# Patient Record
Sex: Female | Born: 1945
Health system: Southern US, Community
[De-identification: ages and names within clinical notes are randomized; demographics above are authoritative.]

## PROBLEM LIST (undated history)

## (undated) DIAGNOSIS — E119 Type 2 diabetes mellitus without complications: Secondary | ICD-10-CM

## (undated) DIAGNOSIS — M5030 Other cervical disc degeneration, unspecified cervical region: Secondary | ICD-10-CM

## (undated) DIAGNOSIS — Z860100 Personal history of colon polyps, unspecified: Secondary | ICD-10-CM

## (undated) DIAGNOSIS — M67431 Ganglion, right wrist: Secondary | ICD-10-CM

## (undated) DIAGNOSIS — N390 Urinary tract infection, site not specified: Secondary | ICD-10-CM

## (undated) DIAGNOSIS — J449 Chronic obstructive pulmonary disease, unspecified: Secondary | ICD-10-CM

## (undated) DIAGNOSIS — IMO0001 Reserved for inherently not codable concepts without codable children: Secondary | ICD-10-CM

## (undated) DIAGNOSIS — M51369 Other intervertebral disc degeneration, lumbar region without mention of lumbar back pain or lower extremity pain: Secondary | ICD-10-CM

## (undated) DIAGNOSIS — Z9889 Other specified postprocedural states: Secondary | ICD-10-CM

## (undated) DIAGNOSIS — D751 Secondary polycythemia: Secondary | ICD-10-CM

## (undated) DIAGNOSIS — C539 Malignant neoplasm of cervix uteri, unspecified: Secondary | ICD-10-CM

## (undated) DIAGNOSIS — D4959 Neoplasm of unspecified behavior of other genitourinary organ: Secondary | ICD-10-CM

## (undated) DIAGNOSIS — K5730 Diverticulosis of large intestine without perforation or abscess without bleeding: Secondary | ICD-10-CM

## (undated) DIAGNOSIS — K635 Polyp of colon: Secondary | ICD-10-CM

## (undated) DIAGNOSIS — B3731 Acute candidiasis of vulva and vagina: Secondary | ICD-10-CM

## (undated) DIAGNOSIS — H3530 Unspecified macular degeneration: Secondary | ICD-10-CM

## (undated) DIAGNOSIS — D249 Benign neoplasm of unspecified breast: Secondary | ICD-10-CM

## (undated) DIAGNOSIS — F17200 Nicotine dependence, unspecified, uncomplicated: Secondary | ICD-10-CM

## (undated) DIAGNOSIS — D499 Neoplasm of unspecified behavior of unspecified site: Secondary | ICD-10-CM

## (undated) DIAGNOSIS — K648 Other hemorrhoids: Secondary | ICD-10-CM

## (undated) DIAGNOSIS — E785 Hyperlipidemia, unspecified: Secondary | ICD-10-CM

## (undated) DIAGNOSIS — R350 Frequency of micturition: Secondary | ICD-10-CM

## (undated) DIAGNOSIS — H269 Unspecified cataract: Secondary | ICD-10-CM

## (undated) DIAGNOSIS — I350 Nonrheumatic aortic (valve) stenosis: Secondary | ICD-10-CM

## (undated) DIAGNOSIS — J45909 Unspecified asthma, uncomplicated: Secondary | ICD-10-CM

## (undated) DIAGNOSIS — M5136 Other intervertebral disc degeneration, lumbar region: Secondary | ICD-10-CM

## (undated) DIAGNOSIS — R112 Nausea with vomiting, unspecified: Secondary | ICD-10-CM

## (undated) DIAGNOSIS — S5290XA Unspecified fracture of unspecified forearm, initial encounter for closed fracture: Secondary | ICD-10-CM

## (undated) DIAGNOSIS — B373 Candidiasis of vulva and vagina: Secondary | ICD-10-CM

## (undated) HISTORY — PX: TONSILLECTOMY: SUR1361

## (undated) HISTORY — PX: APPENDECTOMY: SHX54

## (undated) HISTORY — PX: TUBAL LIGATION: SHX77

## (undated) HISTORY — PX: ABDOMINAL HYSTERECTOMY: SHX81

## (undated) HISTORY — DX: Nicotine dependence, unspecified, uncomplicated: F17.200

## (undated) HISTORY — PX: CATARACT EXTRACTION, BILATERAL: SHX1313

## (undated) HISTORY — PX: BLADDER SURGERY: SHX569

## (undated) HISTORY — PX: OTHER SURGICAL HISTORY: SHX169

## (undated) HISTORY — PX: BREAST LUMPECTOMY: SHX2

## (undated) HISTORY — DX: Type 2 diabetes mellitus without complications: E11.9

## (undated) HISTORY — DX: Hyperlipidemia, unspecified: E78.5

## (undated) HISTORY — DX: Reserved for inherently not codable concepts without codable children: IMO0001

## (undated) HISTORY — PX: TUMOR EXCISION: SHX421

## (undated) HISTORY — PX: BLADDER NECK SUSPENSION: SHX1240

---

## 1999-12-05 ENCOUNTER — Encounter (INDEPENDENT_AMBULATORY_CARE_PROVIDER_SITE_OTHER): Payer: Self-pay | Admitting: Specialist

## 1999-12-05 ENCOUNTER — Other Ambulatory Visit: Admission: RE | Admit: 1999-12-05 | Discharge: 1999-12-05 | Payer: Self-pay | Admitting: Internal Medicine

## 2000-08-27 ENCOUNTER — Other Ambulatory Visit: Admission: RE | Admit: 2000-08-27 | Discharge: 2000-08-27 | Payer: Self-pay | Admitting: Family Medicine

## 2000-09-15 ENCOUNTER — Encounter (INDEPENDENT_AMBULATORY_CARE_PROVIDER_SITE_OTHER): Payer: Self-pay | Admitting: Internal Medicine

## 2001-06-08 ENCOUNTER — Emergency Department (HOSPITAL_COMMUNITY): Admission: EM | Admit: 2001-06-08 | Discharge: 2001-06-08 | Payer: Self-pay | Admitting: Emergency Medicine

## 2001-06-08 ENCOUNTER — Encounter: Payer: Self-pay | Admitting: *Deleted

## 2001-12-10 DIAGNOSIS — K5730 Diverticulosis of large intestine without perforation or abscess without bleeding: Secondary | ICD-10-CM | POA: Insufficient documentation

## 2002-11-18 DIAGNOSIS — R079 Chest pain, unspecified: Secondary | ICD-10-CM | POA: Insufficient documentation

## 2002-12-12 ENCOUNTER — Encounter: Payer: Self-pay | Admitting: Family Medicine

## 2002-12-12 ENCOUNTER — Encounter: Admission: RE | Admit: 2002-12-12 | Discharge: 2002-12-12 | Payer: Self-pay | Admitting: Family Medicine

## 2003-05-05 DIAGNOSIS — M25519 Pain in unspecified shoulder: Secondary | ICD-10-CM | POA: Insufficient documentation

## 2004-06-12 ENCOUNTER — Other Ambulatory Visit: Admission: RE | Admit: 2004-06-12 | Discharge: 2004-06-12 | Payer: Self-pay | Admitting: Obstetrics and Gynecology

## 2005-01-23 ENCOUNTER — Emergency Department (HOSPITAL_COMMUNITY): Admission: EM | Admit: 2005-01-23 | Discharge: 2005-01-23 | Payer: Self-pay | Admitting: Family Medicine

## 2005-04-28 ENCOUNTER — Emergency Department (HOSPITAL_COMMUNITY): Admission: EM | Admit: 2005-04-28 | Discharge: 2005-04-28 | Payer: Self-pay | Admitting: Family Medicine

## 2005-07-30 ENCOUNTER — Emergency Department (HOSPITAL_COMMUNITY): Admission: EM | Admit: 2005-07-30 | Discharge: 2005-07-30 | Payer: Self-pay | Admitting: *Deleted

## 2005-08-19 ENCOUNTER — Emergency Department (HOSPITAL_COMMUNITY): Admission: EM | Admit: 2005-08-19 | Discharge: 2005-08-20 | Payer: Self-pay | Admitting: Emergency Medicine

## 2006-03-29 ENCOUNTER — Emergency Department (HOSPITAL_COMMUNITY): Admission: EM | Admit: 2006-03-29 | Discharge: 2006-03-29 | Payer: Self-pay | Admitting: Pediatrics

## 2006-06-06 ENCOUNTER — Emergency Department (HOSPITAL_COMMUNITY): Admission: EM | Admit: 2006-06-06 | Discharge: 2006-06-06 | Payer: Self-pay | Admitting: Emergency Medicine

## 2007-02-17 ENCOUNTER — Emergency Department (HOSPITAL_COMMUNITY): Admission: EM | Admit: 2007-02-17 | Discharge: 2007-02-17 | Payer: Self-pay | Admitting: Emergency Medicine

## 2007-10-20 ENCOUNTER — Emergency Department (HOSPITAL_COMMUNITY): Admission: EM | Admit: 2007-10-20 | Discharge: 2007-10-20 | Payer: Self-pay | Admitting: Family Medicine

## 2007-12-20 ENCOUNTER — Emergency Department (HOSPITAL_COMMUNITY): Admission: EM | Admit: 2007-12-20 | Discharge: 2007-12-20 | Payer: Self-pay | Admitting: Family Medicine

## 2008-01-28 ENCOUNTER — Ambulatory Visit: Payer: Self-pay | Admitting: Internal Medicine

## 2008-01-28 DIAGNOSIS — J449 Chronic obstructive pulmonary disease, unspecified: Secondary | ICD-10-CM | POA: Insufficient documentation

## 2008-01-28 DIAGNOSIS — J45909 Unspecified asthma, uncomplicated: Secondary | ICD-10-CM | POA: Insufficient documentation

## 2008-01-28 DIAGNOSIS — F341 Dysthymic disorder: Secondary | ICD-10-CM | POA: Insufficient documentation

## 2008-01-28 DIAGNOSIS — B354 Tinea corporis: Secondary | ICD-10-CM | POA: Insufficient documentation

## 2008-01-28 DIAGNOSIS — R350 Frequency of micturition: Secondary | ICD-10-CM | POA: Insufficient documentation

## 2008-01-28 DIAGNOSIS — H3530 Unspecified macular degeneration: Secondary | ICD-10-CM | POA: Insufficient documentation

## 2008-01-28 LAB — CONVERTED CEMR LAB
Bilirubin Urine: NEGATIVE
Blood Glucose, Fingerstick: 160
Hgb A1c MFr Bld: 7.1 %
Ketones, urine, test strip: NEGATIVE
Nitrite: NEGATIVE
Specific Gravity, Urine: <1.005
Urobilinogen, UA: 0.2
pH: 6

## 2008-01-29 ENCOUNTER — Encounter (INDEPENDENT_AMBULATORY_CARE_PROVIDER_SITE_OTHER): Payer: Self-pay | Admitting: Internal Medicine

## 2008-02-08 ENCOUNTER — Encounter (INDEPENDENT_AMBULATORY_CARE_PROVIDER_SITE_OTHER): Payer: Self-pay | Admitting: Internal Medicine

## 2008-02-15 ENCOUNTER — Ambulatory Visit: Payer: Self-pay | Admitting: Internal Medicine

## 2008-02-22 ENCOUNTER — Encounter (INDEPENDENT_AMBULATORY_CARE_PROVIDER_SITE_OTHER): Payer: Self-pay | Admitting: Internal Medicine

## 2008-04-14 ENCOUNTER — Emergency Department (HOSPITAL_COMMUNITY): Admission: EM | Admit: 2008-04-14 | Discharge: 2008-04-14 | Payer: Self-pay | Admitting: Family Medicine

## 2008-08-11 ENCOUNTER — Ambulatory Visit: Payer: Self-pay | Admitting: Internal Medicine

## 2008-08-11 DIAGNOSIS — Z860100 Personal history of colon polyps, unspecified: Secondary | ICD-10-CM | POA: Insufficient documentation

## 2008-08-11 DIAGNOSIS — F17200 Nicotine dependence, unspecified, uncomplicated: Secondary | ICD-10-CM | POA: Insufficient documentation

## 2008-08-11 DIAGNOSIS — M76899 Other specified enthesopathies of unspecified lower limb, excluding foot: Secondary | ICD-10-CM | POA: Insufficient documentation

## 2008-08-11 LAB — CONVERTED CEMR LAB
Bilirubin Urine: NEGATIVE
Glucose, Urine, Semiquant: 100
Ketones, urine, test strip: NEGATIVE

## 2008-08-15 ENCOUNTER — Ambulatory Visit (HOSPITAL_COMMUNITY): Admission: RE | Admit: 2008-08-15 | Discharge: 2008-08-15 | Payer: Self-pay | Admitting: Internal Medicine

## 2008-08-23 DIAGNOSIS — E782 Mixed hyperlipidemia: Secondary | ICD-10-CM | POA: Insufficient documentation

## 2008-08-23 DIAGNOSIS — D751 Secondary polycythemia: Secondary | ICD-10-CM | POA: Insufficient documentation

## 2008-08-23 LAB — CONVERTED CEMR LAB
ALT: 18 units/L (ref 0–35)
Alkaline Phosphatase: 54 units/L (ref 39–117)
CO2: 23 meq/L (ref 19–32)
Calcium: 9.6 mg/dL (ref 8.4–10.5)
Chloride: 102 meq/L (ref 96–112)
Cholesterol: 234 mg/dL — ABNORMAL HIGH (ref 0–200)
Creatinine, Ser: 0.7 mg/dL (ref 0.40–1.20)
Eosinophils Absolute: 0.3 10*3/uL (ref 0.0–0.7)
Glucose, Bld: 124 mg/dL — ABNORMAL HIGH (ref 70–99)
LDL Cholesterol: 145 mg/dL — ABNORMAL HIGH (ref 0–99)
Platelets: 207 10*3/uL (ref 150–400)
RDW: 13.6 % (ref 11.5–15.5)
Sodium: 141 meq/L (ref 135–145)
Triglycerides: 192 mg/dL — ABNORMAL HIGH (ref <150)
VLDL: 38 mg/dL (ref 0–40)
WBC: 10.3 10*3/uL (ref 4.0–10.5)

## 2008-09-08 ENCOUNTER — Ambulatory Visit: Payer: Self-pay | Admitting: Internal Medicine

## 2008-09-08 DIAGNOSIS — E119 Type 2 diabetes mellitus without complications: Secondary | ICD-10-CM | POA: Insufficient documentation

## 2008-10-03 ENCOUNTER — Encounter (INDEPENDENT_AMBULATORY_CARE_PROVIDER_SITE_OTHER): Payer: Self-pay | Admitting: Internal Medicine

## 2008-10-03 ENCOUNTER — Encounter: Admission: RE | Admit: 2008-10-03 | Discharge: 2008-10-03 | Payer: Self-pay | Admitting: Internal Medicine

## 2008-10-20 ENCOUNTER — Ambulatory Visit: Payer: Self-pay | Admitting: Internal Medicine

## 2008-10-20 DIAGNOSIS — M6740 Ganglion, unspecified site: Secondary | ICD-10-CM | POA: Insufficient documentation

## 2008-10-20 LAB — CONVERTED CEMR LAB: Blood Glucose, Fingerstick: 141

## 2008-10-24 ENCOUNTER — Encounter (INDEPENDENT_AMBULATORY_CARE_PROVIDER_SITE_OTHER): Payer: Self-pay | Admitting: Internal Medicine

## 2008-11-28 ENCOUNTER — Encounter (INDEPENDENT_AMBULATORY_CARE_PROVIDER_SITE_OTHER): Payer: Self-pay | Admitting: Internal Medicine

## 2009-02-20 ENCOUNTER — Ambulatory Visit: Payer: Self-pay | Admitting: Internal Medicine

## 2009-02-20 DIAGNOSIS — B373 Candidiasis of vulva and vagina: Secondary | ICD-10-CM | POA: Insufficient documentation

## 2009-02-20 DIAGNOSIS — J069 Acute upper respiratory infection, unspecified: Secondary | ICD-10-CM | POA: Insufficient documentation

## 2009-02-20 LAB — CONVERTED CEMR LAB
Blood Glucose, Fingerstick: 190
Hgb A1c MFr Bld: 7 % — ABNORMAL HIGH (ref 4.6–6.1)

## 2009-03-13 ENCOUNTER — Encounter (INDEPENDENT_AMBULATORY_CARE_PROVIDER_SITE_OTHER): Payer: Self-pay | Admitting: Internal Medicine

## 2009-06-25 ENCOUNTER — Emergency Department (HOSPITAL_COMMUNITY): Admission: EM | Admit: 2009-06-25 | Discharge: 2009-06-25 | Payer: Self-pay | Admitting: Family Medicine

## 2009-08-25 ENCOUNTER — Encounter (INDEPENDENT_AMBULATORY_CARE_PROVIDER_SITE_OTHER): Payer: Self-pay | Admitting: Internal Medicine

## 2009-08-25 DIAGNOSIS — D249 Benign neoplasm of unspecified breast: Secondary | ICD-10-CM | POA: Insufficient documentation

## 2009-08-25 DIAGNOSIS — M19049 Primary osteoarthritis, unspecified hand: Secondary | ICD-10-CM | POA: Insufficient documentation

## 2009-09-02 DIAGNOSIS — I359 Nonrheumatic aortic valve disorder, unspecified: Secondary | ICD-10-CM | POA: Insufficient documentation

## 2009-09-02 DIAGNOSIS — I350 Nonrheumatic aortic (valve) stenosis: Secondary | ICD-10-CM

## 2009-09-02 HISTORY — DX: Nonrheumatic aortic (valve) stenosis: I35.0

## 2010-03-21 NOTE — Letter (Signed)
Summary: RECEIVED RECORDS FROM [facility]  RECEIVED RECORDS FROM [facility]   Imported By: Arta Bruce 09/11/2009 15:58:12  _____________________________________________________________________  External Attachment:    Type:   Image     Comment:   External Document

## 2010-03-21 NOTE — Letter (Signed)
Summary: *HSN Results Follow up  [facility]  [address] [place], [place] [address]   Phone: [phone]  Fax: [phone]      03/13/2009   [facility] [place] [address] [place], [place]  [address]   Dear  Ms. Zali Frieden,                            ____S.Drinkard,FNP   ____D. Gore,FNP       ____B. McPherson,MD   ____V. Rankins,MD    __X__E. Dannel Rafter,MD    ____N. Daphine Deutscher, FNP  ____D. Reche Dixon, MD    ____K. Philipp Deputy, MD    ____Other     This letter is to inform you that your recent test(s):  _______Pap Smear    ___X____Lab Test     _______X-ray    ___X____ is within acceptable limits  _______ requires a medication change  _______ requires a follow-up lab visit  _______ requires a follow-up visit with your provider   Comments:  Despite drinking a large quantity of orange juice and struggling with desserts over the holidays, your sugar control was not terrible.  I would still encourage you to eat fruit rather than the juice and cut back to just one cup of juice daily as discussedl.       _________________________________________________________ If you have any questions, please contact our office                     Sincerely,  Julieanne Manson MD [facility]

## 2010-03-21 NOTE — Miscellaneous (Signed)
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 Swaziland, cardiologist. to AORTIC STENOSIS, MILD (ICD-424.1) - Dr. Swaziland, cardiologist. 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) VAGINITIS, CANDIDAL (ICD-112.1) URI (ICD-465.9) GANGLION CYST, WRIST, RIGHT (ICD-727.41) DIABETES MELLITUS, TYPE II (ICD-250.00) HYPERLIPIDEMIA, MIXED (ICD-272.2) POLYCYTHEMIA, SECONDARY (ICD-289.0) TOBACCO ABUSE (ICD-305.1) TROCHANTERIC BURSITIS, RIGHT (ICD-726.5) COLONIC POLYPS, ADENOMATOUS, HX OF (ICD-V12.72) HEALTH MAINTENANCE EXAM (ICD-V70.0) DEPRESSION/ANXIETY (ICD-300.4) FREQUENCY, URINARY (ICD-788.41) TINEA CORPORIS (ICD-110.5) MACULAR DEGENERATION (ICD-362.50) AORTIC STENOSIS, MILD (ICD-424.1) ASTHMA, CHILDHOOD (ICD-493.00) COPD (ICD-496)   (08/25/2009 8:32) Added new observation of COLONOSCOPY: Dr. 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) VAGINITIS, CANDIDAL (ICD-112.1) URI (ICD-465.9) GANGLION CYST, WRIST, RIGHT (ICD-727.41) DIABETES MELLITUS, TYPE II (ICD-250.00) HYPERLIPIDEMIA, MIXED (ICD-272.2) POLYCYTHEMIA, SECONDARY (ICD-289.0) TOBACCO ABUSE (ICD-305.1) TROCHANTERIC BURSITIS, RIGHT (ICD-726.5) COLONIC POLYPS, ADENOMATOUS, HX OF (ICD-V12.72) HEALTH MAINTENANCE EXAM (ICD-V70.0) DEPRESSION/ANXIETY  (ICD-300.4) FREQUENCY, URINARY (ICD-788.41) TINEA CORPORIS (ICD-110.5) MACULAR DEGENERATION (ICD-362.50) AORTIC STENOSIS, MILD (ICD-424.1) ASTHMA, CHILDHOOD (ICD-493.00) COPD (ICD-496)    Appended Document: old records update    Clinical Lists Changes  Problems: Added new problem of DIVERTICULOSIS OF COLON (ICD-562.10) - colonoscopy      Appended Document: old records update    Clinical Lists Changes  Problems: Changed problem from AORTIC STENOSIS, MILD (ICD-424.1) - Dr. Swaziland, cardiologist. to AORTIC STENOSIS, MILD (ICD-424.1) - Dr. Swaziland, cardiologist. Added new problem of CHEST PAIN (ICD-786.50) - Dr. Jordan:Stress Cardiolite showed Mild attenuation in distal anterior wall.  Slightly worse on stress images/  Present on resting images.  Felt to most likely represent breast attenuation. Frequent PVC and a pattern of bigeminy on resting ECG, otherwise normal ECG at rest and with stress.  No chest pain on stress.  To work on risk factors.

## 2010-03-21 NOTE — Letter (Signed)
Summary: RECEIVED RECORDS [facility]  [facility]   Imported By: Arta Bruce 09/03/2009 16:17:10  _____________________________________________________________________  External Attachment:    Type:   Image     Comment:   External Document

## 2010-03-21 NOTE — Assessment & Plan Note (Signed)
Summary: 1 MONTH -6 WEEKK FU//KT   Vital Signs:  Patient profile:   65 year old female Menstrual status:  hysterectomy Weight:      185 pounds Temp:     98.2 degrees F Pulse rate:   77 / minute Pulse rhythm:   regular Resp:     18 per minute BP sitting:   125 / 79  (left arm) Cuff size:   regular  Vitals Entered By: Vesta Mixer CMA (February 20, 2009 9:03 AM) Is Patient Diabetic? Yes Pain Assessment Patient in pain? no      CBG Result 190  Does patient need assistance? Ambulation Normal   History of Present Illness: 1.  Anxiety and stress:  Feels she is doing well.  Did not take the Wellbutin beyond 1 month--cancelled a couple of follow up appts.  States things have improved in some ways in life and that has helped.    2.  Tobacco Cessation:  States has smoked a couple of times--last was 2 1/2 weeks ago--took 2 puffs and put it out.  All of friends smoke.  Granddaughter also no longer living with her--no longer exposing her to tobacco smoke.  Pt. still with legal custody.  3.  DM:  Eating a hard candy secondary to hoarseness.  Ate a lot of desserts over the holiday.  Generally trying to watch what she its, however.  Walking regularly when weather permits.  Does not have a glucometer.  Pt. would really like to avoid meds.  Did go to Nutrition--felt she received good info.  Later states she has been drinking a quart daily of orange juice.  4.  Hyperlipidemia:  Pt. does not give a good low cholesterol diet.  5.  Sore throat, cough and congestion with hoarse voice started 6 days ago.  No definite posterior pharyngeal drainage--was coughing up tan mucous previously--that is gone.  Using Nyquil.  No fevers.  No dyspnea. Clear nasal drainage.  7.  Abscessed tooth--right maxillary:  went to [facility]--on Amoxicillin.  Having vaginal itch and discharge   Allergies (verified): 1)  ! Codeine  Physical Exam  General:  NAD, hoarse sounding Eyes:  Clear watering from right  eye Ears:  External ear exam shows no significant lesions or deformities.  Otoscopic examination reveals clear canals, tympanic membranes are intact bilaterally without bulging, retraction, inflammation or discharge. Hearing is grossly normal bilaterally. Nose:  clear discharge, mildly swollen nasal mucosa. Mouth:  pharynx pink and moist.   Neck:  No deformities, masses, or tenderness noted. Lungs:  Normal respiratory effort, chest expands symmetrically. Lungs are clear to auscultation, no crackles or wheezes. Heart:  Normal rate and regular rhythm. S1 and S2 normal without gallop, murmur, click, rub or other extra sounds.  Radial pulses normal and equal.   Impression & Recommendations:  Problem # 1:  DIABETES MELLITUS, TYPE II (ICD-250.00) Wants another 3-4 months to work on diet and lifestyle changes before trying meds. Orders: Capillary Blood Glucose/CBG (82948) T- Hemoglobin A1C ([address]-60454) Flu shot today.  Problem # 2:  HYPERLIPIDEMIA, MIXED (ICD-272.2) As above  Problem # 3:  TOBACCO ABUSE (ICD-305.1) Doing fairly well with cessation  Problem # 4:  DEPRESSION/ANXIETY (ICD-300.4) Pt. feels this is no longer an issue  Problem # 5:  URI (ICD-465.9) Supportive treatment  Problem # 6:  VAGINITIS, CANDIDAL (ICD-112.1)  On Amoxicillin long term--having yeast symptoms.  Her updated medication list for this problem includes:    Fluconazole 150 Mg Tabs (Fluconazole) .Marland Kitchen... 1 tab  by mouth today.  may repeat if symptoms return for 1 dose --while on amoxicillin  Complete Medication List: 1)  Advair Diskus 100-50 Mcg/dose Misc (Fluticasone-salmeterol) .Marland Kitchen.. 1 inhalation two times a day 2)  Fluconazole 150 Mg Tabs (Fluconazole) .Marland Kitchen.. 1 tab by mouth today.  may repeat if symptoms return for 1 dose --while on amoxicillin  Patient Instructions: 1)  Fasting labs in 3 months--FLP, A1C, urine microalbumin, BMET 2)  Follow up with Dr. Delrae Alfred in 3 months --schedule a couple of days after  labs done. 3)  Stop drinking juice or sugary drinks. 4)  Take Fluconazole for yeast now, repeat intermittently if needed while taking Amoxicillin. Prescriptions: FLUCONAZOLE 150 MG TABS (FLUCONAZOLE) 1 tab by mouth today.  May repeat if symptoms return for 1 dose --while on Amoxicillin  #2 x 0   Entered and Authorized by:   Julieanne Manson MD   Signed by:   Julieanne Manson MD on 02/20/2009   Method used:   Electronically to        [facility] [phone]* (retail)       [address]       [place], [place]  [address]       Ph: [phone]       Fax: [phone]   RxID:   [id number]   Laboratory Results   Blood Tests     CBG Random:: 190mg /dL       Appended Document: 1 MONTH -6 WEEKK FU//KT   Influenza Vaccine    Vaccine Type: Fluvax 3+    Site: right deltoid    Mfr: Sanofi Pasteur    Dose: 0.5 ml    Route: IM    Given by: Vesta Mixer CMA    Exp. Date: 08/16/2009    Lot #: [id number]    VIS given: 09/10/06 version given February 20, 2009.  Flu Vaccine Consent Questions    Do you have a history of severe allergic reactions to this vaccine? no    Any prior history of allergic reactions to egg and/or gelatin? no    Do you have a sensitivity to the preservative Thimersol? no    Do you have a past history of Guillan-Barre Syndrome? no    Do you currently have an acute febrile illness? no    Have you ever had a severe reaction to latex? no    Vaccine information given and explained to patient? yes    Are you currently pregnant? no

## 2010-03-28 ENCOUNTER — Inpatient Hospital Stay (INDEPENDENT_AMBULATORY_CARE_PROVIDER_SITE_OTHER)
Admission: RE | Admit: 2010-03-28 | Discharge: 2010-03-28 | Disposition: A | Discharge status: Home / Self Care | Payer: Self-pay | Source: Ambulatory Visit | Attending: Family Medicine | Admitting: Family Medicine

## 2010-03-28 DIAGNOSIS — J45909 Unspecified asthma, uncomplicated: Secondary | ICD-10-CM

## 2010-07-05 NOTE — H&P (Signed)
NAMECAMILIA, Kathleen Ramirez                ACCOUNT NO.:  [id number]   MEDICAL RECORD NO.:  [id number]          PATIENT TYPE:  EMS   LOCATION:  [facility]                         FACILITY:  [facility]   PHYSICIAN:  Gordy Savers, MDDATE OF BIRTH:  [birth year]   DATE OF ADMISSION:  08/19/2005  DATE OF DISCHARGE:  08/20/2005                                HISTORY & PHYSICAL   CHIEF COMPLAINT:  Shortness of breath.   HISTORY OF PRESENT ILLNESS:  The patient is a 65 year old white female with  a history of advanced lung disease.  She was stable until shortly before  admission, when she awoke complaining of increasing shortness of breath.  She presented to the ED for evaluation, where she is noted to have a  moderate severe anemia with an H&H of 6.8 and 20% respectively.  She is now  admitted for further evaluation and treatment of her anemia.   The patient was last hospitalized in April 2007.  At that time, she  presented with acute on chronic respiratory failure, and the patient was  noted have a low hemoglobin of 6.7 at that time.  The patient received 2  units of packed RBCs and stabilized.  There was no documented GI bleeding at  that time.  She was evaluated by the GI service.  I do not feel she was  stable enough to undergo GI evaluation.  The patient states that has had  colonoscopy in the past but not a number of years.  The patient has been  hospitalized on the average of one or two times per year for decompensated  COPD, and there has been no prior episodes of anemia.   PAST MEDICAL HISTORY:  The patient has a history advanced COPD.  This also  has been described as chronic interstitial lung disease.  She is on home  oxygen therapy and has a DNR status.  She has been hospitalized in July 2003  for ventilator-dependent respiratory failure.  She has history of congestive  heart failure in the past and normal LV function.  Early this spring, did  have a follow-up 2-D echocardiogram that  revealed LVH and normal LV  function.  She has history of palpitations and chronic PVCs.  There is a  history of ethanol abuse and tobacco use.  She has hypertension and chronic  anxiety with insomnia and a chronic right bundle branch block.  There is  also history of carotid bruits.   OPERATIONS:  Have included ventral hernia repair, as well as a left  hemicolectomy.   ALLERGIES:  INCLUDE PENICILLIN, MORPHINE SULFATE.   PRESENT MEDICAL REGIMEN:  Includes:  1. Aspirin 81 mg daily.  2. Lisinopril 20 mg daily.  3. Protonix 40 mg daily.  4. Potassium chloride 40 mEq daily.  5. Mucinex DM.  6. Furosemide 40 mg daily.  7. Temazepam 30 mg at bedtime p.r.n. sleep.  8. Lorazepam 1 mg t.i.d. p.r.n.  9. DuoNeb nebulizer treatments.   SOCIAL HISTORY:  She has a son and daughter in the area.  According to a  prior record and the chart, they  provide around the clock care.  The patient  states that she lives alone, although is confused at present time.   EXAMINATION:  VITAL SIGNS:  Blood pressure 110/70, pulse 84, O2 saturation  90% on a Venturi mask.  HEAD AND NECK:  Revealed normal pupil responses.  Conjunctiva clear.  Oropharynx benign.  NECK:  Revealed no neck vein distension.  There is no audible bruits.  CHEST:  Revealed rhonchi, the right greater than the left.  CARDIOVASCULAR:  Exam revealed normal S1, S2 and no murmur.  ABDOMEN:  Obese, soft and nontender.  A large surgical scar was noted on the  right.  Bowel sounds were normal.  EXTREMITIES:  Revealed dry flaky skin but  no significant edema.  Peripheral pulses were nonpalpable.  NEURO:  Negative.   IMPRESSION:  Mildly severe anemia with heme-negative stool, advanced chronic  lung disease, hypertension, chronic anxiety.   DISPOSITION:  The patient will be admitted to hospital and transfused 2  units of packed RBCs.  Stool for occult blood will be monitored.  Anemia  workup will be instituted.  The patient will be maintained on  her chronic  pre-admission medications.           ______________________________  Gordy Savers, MD     PFK/MEDQ  D:  12/28/2005  T:  12/28/2005  Job:  [id number]

## 2010-08-23 ENCOUNTER — Ambulatory Visit (HOSPITAL_COMMUNITY)
Admission: RE | Admit: 2010-08-23 | Discharge: 2010-08-23 | Disposition: A | Discharge status: Home / Self Care | Payer: Self-pay | Source: Ambulatory Visit | Attending: Neurosurgery | Admitting: Neurosurgery

## 2010-08-23 ENCOUNTER — Other Ambulatory Visit (HOSPITAL_COMMUNITY): Payer: Self-pay | Admitting: Neurosurgery

## 2010-08-23 DIAGNOSIS — M545 Low back pain, unspecified: Secondary | ICD-10-CM | POA: Insufficient documentation

## 2010-08-23 DIAGNOSIS — M5137 Other intervertebral disc degeneration, lumbosacral region: Secondary | ICD-10-CM | POA: Insufficient documentation

## 2010-08-23 DIAGNOSIS — R52 Pain, unspecified: Secondary | ICD-10-CM

## 2010-08-23 DIAGNOSIS — IMO0001 Reserved for inherently not codable concepts without codable children: Secondary | ICD-10-CM | POA: Insufficient documentation

## 2010-08-23 DIAGNOSIS — M51379 Other intervertebral disc degeneration, lumbosacral region without mention of lumbar back pain or lower extremity pain: Secondary | ICD-10-CM | POA: Insufficient documentation

## 2010-11-22 LAB — POCT URINALYSIS DIP (DEVICE)
Bilirubin Urine: NEGATIVE
Ketones, ur: NEGATIVE
Operator id: 235561
Specific Gravity, Urine: 1.01

## 2011-06-23 ENCOUNTER — Emergency Department (HOSPITAL_COMMUNITY)
Admission: EM | Admit: 2011-06-23 | Discharge: 2011-06-23 | Disposition: A | Discharge status: Home / Self Care | Payer: Self-pay | Attending: Emergency Medicine | Admitting: Emergency Medicine

## 2011-06-23 ENCOUNTER — Encounter (HOSPITAL_COMMUNITY): Payer: Self-pay | Admitting: *Deleted

## 2011-06-23 ENCOUNTER — Emergency Department (HOSPITAL_COMMUNITY): Payer: Self-pay

## 2011-06-23 DIAGNOSIS — F17200 Nicotine dependence, unspecified, uncomplicated: Secondary | ICD-10-CM | POA: Insufficient documentation

## 2011-06-23 DIAGNOSIS — W208XXA Other cause of strike by thrown, projected or falling object, initial encounter: Secondary | ICD-10-CM | POA: Insufficient documentation

## 2011-06-23 DIAGNOSIS — S60219A Contusion of unspecified wrist, initial encounter: Secondary | ICD-10-CM | POA: Insufficient documentation

## 2011-06-23 DIAGNOSIS — M25539 Pain in unspecified wrist: Secondary | ICD-10-CM | POA: Insufficient documentation

## 2011-06-23 NOTE — ED Notes (Signed)
Pt states that a wratchet fell on her left wrist on Friday.  Pt continues to have pain to wrist, swelling or deformity noted?  No impaired sensation

## 2011-06-23 NOTE — Discharge Instructions (Signed)
Bone Bruise  A bone bruise is a small hidden fracture of the bone. It typically occurs with bones located close to the surface of the skin.  SYMPTOMS  The pain lasts longer than a normal bruise.   The bruised area is difficult to use.   There may be discoloration or swelling of the bruised area.   When a bone bruise is found with injury to the anterior cruciate ligament (in the knee) there is often an increased:   Amount of fluid in the knee   Time the fluid in the knee lasts.   Number of days until you are walking normally and regaining the motion you had before the injury.   Number of days with pain from the injury.  DIAGNOSIS  It can only be seen on X-rays known as MRIs. This stands for magnetic resonance imaging. A regular X-ray taken of a bone bruise would appear to be normal. A bone bruise is a common injury in the knee and the heel bone (calcaneus). The problems are similar to those produced by stress fractures, which are bone injuries caused by overuse. A bone bruise may also be a sign of other injuries. For example, bone bruises are commonly found where an anterior cruciate ligament (ACL) in the knee has been pulled away from the bone (ruptured). A ligament is a tough fibrous material that connects bones together to make our joints stable. Bruises of the bone last a lot longer than bruises of the muscle or tissues beneath the skin. Bone bruises can last from days to months and are often more severe and painful than other bruises. TREATMENT Because bone bruises are sudden injuries you cannot often prevent them, other than by being extremely careful. Some things you can do to improve the condition are:  Apply ice to the sore area for 15 to 20 minutes, 3 to 4 times per day while awake for the first 2 days. Put the ice in a plastic bag, and place a towel between the bag of ice and your skin.   Keep your bruised area raised (elevated) when possible to lessen swelling.   For activity:     Use crutches when necessary; do not put weight on the injured leg until you are no longer tender.   You may walk on your affected part as the pain allows, or as instructed.   Start weight bearing gradually on the bruised part.   Continue to use crutches or a cane until you can stand without causing pain, or as instructed.   If a plaster splint was applied, wear the splint until you are seen for a follow-up examination. Rest it on nothing harder than a pillow the first 24 hours. Do not put weight on it. Do not get it wet. You may take it off to take a shower or bath.   If an air splint was applied, more air may be blown into or out of the splint as needed for comfort. You may take it off at night and to take a shower or bath.   Wiggle your toes in the splint several times per day if you are able.   You may have been given an elastic bandage to use with the plaster splint or alone. The splint is too tight if you have numbness, tingling or if your foot becomes cold and blue. Adjust the bandage to make it comfortable.   Only take over-the-counter or prescription medicines for pain, discomfort, or fever as directed by   your caregiver.   Follow all instructions for follow up with your caregiver. This includes any orthopedic referrals, physical therapy, and rehabilitation. Any delay in obtaining necessary care could result in a delay or failure of the bones to heal.  SEEK MEDICAL CARE IF:   You have an increase in bruising, swelling, or pain.   You notice coldness of your toes.   You do not get pain relief with medications.  SEEK IMMEDIATE MEDICAL CARE IF:   Your toes are numb or blue.   You have severe pain not controlled with medications.   If any of the problems that caused you to seek care are becoming worse.  Document Released: 04/26/2003 Document Revised: 01/23/2011 Document Reviewed: 09/08/2007 Exit[employer] Patient Information 2012 [employer].Contusion A contusion is a deep  bruise. Contusions happen when an injury causes bleeding under the skin. Signs of bruising include pain, puffiness (swelling), and discolored skin. The contusion may turn blue, purple, or yellow. HOME CARE   Put ice on the injured area.   Put ice in a plastic bag.   Place a towel between your skin and the bag.   Leave the ice on for 15 to 20 minutes, 3 to 4 times a day.   Only take medicine as told by your doctor.   Rest the injured area.   If possible, raise (elevate) the injured area to lessen puffiness.  GET HELP RIGHT AWAY IF:   You have more bruising or puffiness.   You have pain that is getting worse.   Your puffiness or pain is not helped by medicine.  MAKE SURE YOU:   Understand these instructions.   Will watch your condition.   Will get help right away if you are not doing well or get worse.  Document Released: 07/23/2007 Document Revised: 01/23/2011 Document Reviewed: 12/09/2010 [employer] Patient Information 2012 [employer]. 

## 2011-06-23 NOTE — ED Provider Notes (Signed)
This chart was scribed for Gwyneth Sprout, MD by Williemae Natter. The patient was seen in room STRE4/STRE4 at 4:59 PM.  History     CSN: [id number]  Arrival date & time 06/23/11  1559   First MD Initiated Contact with Patient 06/23/11 1653      Chief Complaint  Patient presents with  . Wrist Injury    (Consider location/radiation/quality/duration/timing/severity/associated sxs/prior treatment) HPI Comments: Pain as a 6 out of a can at times sharp and other times is burning. It does not radiate  The history is provided by the patient.   Kathleen Ramirez is a 66 y.o. female who presents to the Emergency Department complaining of wrist pain. Pt was hit by a falling steel ratchet on her left wrist. Pt reports that her wrist burns. Pt has not treated pain with anything.  Past Medical History  Diagnosis Date  . Cancer     Past Surgical History  Procedure Date  . Abdominal hysterectomy   . Tubal ligation   . Tonsillectomy   . Bladder surgery   . Breast lumpectomy     No family history on file.  History  Substance Use Topics  . Smoking status: Current Everyday Smoker    Types: Cigarettes  . Smokeless tobacco: Not on file  . Alcohol Use: Yes    OB History    Grav Para Term Preterm Abortions TAB SAB Ect Mult Living                  Review of Systems  Constitutional: Negative for fever and chills.  Respiratory: Negative for shortness of breath.   Gastrointestinal: Negative for nausea and vomiting.  Neurological: Negative for weakness.  All other systems reviewed and are negative.    Allergies  Codeine  Home Medications  No current outpatient prescriptions on file.  BP 135/70  Pulse 80  Temp(Src) 98.4 F (36.9 C) (Oral)  Resp 18  SpO2 100%  Physical Exam  Nursing note and vitals reviewed. Constitutional: She is oriented to person, place, and time. She appears well-developed and well-nourished. No distress.  HENT:  Head: Normocephalic and atraumatic.    Eyes: EOM are normal.  Neck: Normal range of motion. Neck supple. No tracheal deviation present.  Cardiovascular: Normal rate.   Pulmonary/Chest: Effort normal. No respiratory distress.  Musculoskeletal: Normal range of motion.       Healing abrasion over lateral aspect of left forearm Ecchymosis and swelling over dorsal portion of left wrist Pain with ROM normal cap refill  No snuffbox tenderness  Neurological: She is alert and oriented to person, place, and time.  Skin: Skin is warm and dry.  Psychiatric: She has a normal mood and affect. Her behavior is normal.    ED Course  Procedures (including critical care time)  Labs Reviewed - No data to display Dg Wrist Complete Left  06/23/2011  *RADIOLOGY REPORT*  Clinical Data: Wrist pain.  Dorsal soft tissue swelling of the wrist.  LEFT WRIST - COMPLETE 3+ VIEW  Comparison: None.  Findings: Anatomic alignment of the bones of the wrist.  Basal joint of the thumb osteoarthritis.  There is no fracture identified.  The distal radial ulnar joint appears normal.  Dorsal soft tissue swelling is present over the distal radial metaphysis. Scaphoid appears intact.  STT joint osteoarthritis.  IMPRESSION: Dorsal wrist soft tissue swelling.  No fracture.  Original Report Authenticated By: Andreas Newport, M.D.     1. Wrist contusion       MDM  Patient with an injury to the left wrist several days ago with persistent swelling and pain. There is no evidence of fracture or on x-ray. Patient has normal range of motion and normal sensation. There is a good pulse and capillary refill. No other signs of injury.  Patient is to continue ice and ibuprofen. I personally performed the services described in this documentation, which was scribed in my presence.  The recorded information has been reviewed and considered.        Gwyneth Sprout, MD 06/23/11 [phone]

## 2011-07-21 ENCOUNTER — Other Ambulatory Visit: Payer: Self-pay | Admitting: Internal Medicine

## 2011-07-21 ENCOUNTER — Ambulatory Visit (HOSPITAL_COMMUNITY)
Admission: RE | Admit: 2011-07-21 | Discharge: 2011-07-21 | Disposition: A | Discharge status: Home / Self Care | Payer: Self-pay | Source: Ambulatory Visit | Attending: Internal Medicine | Admitting: Internal Medicine

## 2011-07-21 DIAGNOSIS — M25532 Pain in left wrist: Secondary | ICD-10-CM

## 2011-07-21 DIAGNOSIS — M19039 Primary osteoarthritis, unspecified wrist: Secondary | ICD-10-CM | POA: Insufficient documentation

## 2011-07-21 DIAGNOSIS — M25539 Pain in unspecified wrist: Secondary | ICD-10-CM | POA: Insufficient documentation

## 2011-08-31 ENCOUNTER — Encounter (HOSPITAL_COMMUNITY): Payer: Self-pay | Admitting: Emergency Medicine

## 2011-08-31 ENCOUNTER — Emergency Department (HOSPITAL_COMMUNITY): Payer: Self-pay

## 2011-08-31 ENCOUNTER — Emergency Department (HOSPITAL_COMMUNITY)
Admission: EM | Admit: 2011-08-31 | Discharge: 2011-08-31 | Disposition: A | Discharge status: Home / Self Care | Payer: Self-pay | Source: Home / Self Care | Attending: Emergency Medicine | Admitting: Emergency Medicine

## 2011-08-31 ENCOUNTER — Emergency Department (HOSPITAL_COMMUNITY)
Admission: EM | Admit: 2011-08-31 | Discharge: 2011-08-31 | Disposition: A | Discharge status: Home / Self Care | Payer: Self-pay | Attending: Emergency Medicine | Admitting: Emergency Medicine

## 2011-08-31 DIAGNOSIS — R071 Chest pain on breathing: Secondary | ICD-10-CM

## 2011-08-31 DIAGNOSIS — R0781 Pleurodynia: Secondary | ICD-10-CM

## 2011-08-31 DIAGNOSIS — R079 Chest pain, unspecified: Secondary | ICD-10-CM | POA: Insufficient documentation

## 2011-08-31 DIAGNOSIS — R0789 Other chest pain: Secondary | ICD-10-CM

## 2011-08-31 DIAGNOSIS — A499 Bacterial infection, unspecified: Secondary | ICD-10-CM

## 2011-08-31 DIAGNOSIS — Z8543 Personal history of malignant neoplasm of ovary: Secondary | ICD-10-CM | POA: Insufficient documentation

## 2011-08-31 DIAGNOSIS — Z8541 Personal history of malignant neoplasm of cervix uteri: Secondary | ICD-10-CM | POA: Insufficient documentation

## 2011-08-31 DIAGNOSIS — F17200 Nicotine dependence, unspecified, uncomplicated: Secondary | ICD-10-CM | POA: Insufficient documentation

## 2011-08-31 DIAGNOSIS — B9689 Other specified bacterial agents as the cause of diseases classified elsewhere: Secondary | ICD-10-CM

## 2011-08-31 DIAGNOSIS — R1011 Right upper quadrant pain: Secondary | ICD-10-CM

## 2011-08-31 DIAGNOSIS — N760 Acute vaginitis: Secondary | ICD-10-CM

## 2011-08-31 HISTORY — DX: Malignant neoplasm of cervix uteri, unspecified: C53.9

## 2011-08-31 HISTORY — DX: Polyp of colon: K63.5

## 2011-08-31 HISTORY — DX: Candidiasis of vulva and vagina: B37.3

## 2011-08-31 HISTORY — DX: Urinary tract infection, site not specified: N39.0

## 2011-08-31 HISTORY — DX: Neoplasm of unspecified behavior of other genitourinary organ: D49.59

## 2011-08-31 HISTORY — DX: Acute candidiasis of vulva and vagina: B37.31

## 2011-08-31 HISTORY — DX: Neoplasm of unspecified behavior of unspecified site: D49.9

## 2011-08-31 LAB — POCT URINALYSIS DIP (DEVICE)
Bilirubin Urine: NEGATIVE
Glucose, UA: 500 mg/dL — AB
Ketones, ur: NEGATIVE mg/dL
Protein, ur: NEGATIVE mg/dL

## 2011-08-31 LAB — WET PREP, GENITAL: Yeast Wet Prep HPF POC: NONE SEEN

## 2011-08-31 MED ORDER — HYDROCODONE-ACETAMINOPHEN 5-500 MG PO TABS: 1.0000 | ORAL_TABLET | Freq: Four times a day (QID) | ORAL | Status: AC | PRN

## 2011-08-31 MED ORDER — METRONIDAZOLE 500 MG PO TABS: 500.0000 mg | ORAL_TABLET | Freq: Two times a day (BID) | ORAL | Status: AC

## 2011-08-31 MED ORDER — KETOROLAC TROMETHAMINE 60 MG/2ML IM SOLN
60.0000 mg | Freq: Once | INTRAMUSCULAR | Status: AC
  Administered 2011-08-31: 60 mg via INTRAMUSCULAR
  Filled 2011-08-31: qty 2

## 2011-08-31 MED ORDER — HYDROMORPHONE HCL PF 1 MG/ML IJ SOLN
1.0000 mg | Freq: Once | INTRAMUSCULAR | Status: AC
  Administered 2011-08-31: 1 mg via INTRAMUSCULAR
  Filled 2011-08-31: qty 1

## 2011-08-31 MED ORDER — FLUCONAZOLE 150 MG PO TABS: ORAL_TABLET | ORAL | Status: AC

## 2011-08-31 MED ORDER — METRONIDAZOLE 500 MG PO TABS
500.0000 mg | ORAL_TABLET | Freq: Once | ORAL | Status: AC
  Administered 2011-08-31: 500 mg via ORAL
  Filled 2011-08-31: qty 1

## 2011-08-31 MED ORDER — FLUCONAZOLE 100 MG PO TABS
150.0000 mg | ORAL_TABLET | Freq: Once | ORAL | Status: AC
  Administered 2011-08-31: 150 mg via ORAL
  Filled 2011-08-31: qty 2

## 2011-08-31 NOTE — ED Provider Notes (Signed)
I saw and evaluated the patient, reviewed the resident's note and I agree with the findings and plan.  65yF with R sided CP. Easily reproducible. No overlying skin changes. No hx of trauma. No abdominal tenderness. No respiratory distress. W/u reassuring. Strongly suspect this is musculoskeletal. Doubt ACS, infectious, PE. Plan symptomatic tx. Return precautions discussed.  Raeford Razor, MD 08/31/11 [phone]

## 2011-08-31 NOTE — ED Notes (Signed)
Pt. Stated, I've had a vaginal infection and rt. Side pain for pain

## 2011-08-31 NOTE — ED Provider Notes (Signed)
History     CSN: [id number]  Arrival date & time 08/31/11  1333   First MD Initiated Contact with Patient 08/31/11 1644      Chief Complaint  Patient presents with  . Abdominal Pain    (Consider location/radiation/quality/duration/timing/severity/associated sxs/prior treatment) Patient is a 66 y.o. female presenting with chest pain. The history is provided by the patient.  Chest Pain The chest pain began 5 - 7 days ago. Duration of episode(s) is 7 days. Chest pain occurs constantly. The chest pain is unchanged. The pain is associated with breathing and lifting. The severity of the pain is moderate. The quality of the pain is described as sharp. The pain does not radiate. Chest pain is worsened by certain positions and deep breathing. Pertinent negatives for primary symptoms include no fever, no fatigue, no shortness of breath, no cough, no wheezing, no palpitations, no abdominal pain, no nausea, no vomiting and no dizziness.  Pertinent negatives for associated symptoms include no diaphoresis and no numbness. She tried NSAIDs for the symptoms.  Pertinent negatives for past medical history include no seizures. Procedure history comments: none.     Past Medical History  Diagnosis Date  . Cancer   . Cervical cancer   . Tumors     5tumors removed  . Bilateral ovarian tumors   . Dysplastic colon polyp   . Yeast vaginitis   . UTI (lower urinary tract infection)     Past Surgical History  Procedure Date  . Abdominal hysterectomy   . Tubal ligation   . Tonsillectomy   . Bladder surgery   . Breast lumpectomy   . Tumor excision     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  . Oophrectomy   . Bladder neck suspension   . Appendectomy     History reviewed. No pertinent family history.  History  Substance Use Topics  . Smoking status: Current Everyday Smoker    Types: Cigarettes  . Smokeless tobacco: Not on file  . Alcohol Use: Yes    OB History    Grav Para Term  Preterm Abortions TAB SAB Ect Mult Living                  Review of Systems  Constitutional: Negative for fever, chills, diaphoresis and fatigue.  HENT: Negative for ear pain, congestion, sore throat, facial swelling, mouth sores, trouble swallowing, neck pain and neck stiffness.   Eyes: Negative.   Respiratory: Negative for apnea, cough, chest tightness, shortness of breath and wheezing.   Cardiovascular: Positive for chest pain. Negative for palpitations and leg swelling.  Gastrointestinal: Negative for nausea, vomiting, abdominal pain, diarrhea and abdominal distention.  Genitourinary: Positive for vaginal pain. Negative for hematuria, flank pain, vaginal discharge, difficulty urinating and menstrual problem.  Musculoskeletal: Negative for back pain and gait problem.  Skin: Negative for rash and wound.  Neurological: Negative for dizziness, tremors, seizures, syncope, facial asymmetry, numbness and headaches.  Psychiatric/Behavioral: Negative.   All other systems reviewed and are negative.    Allergies  Codeine  Home Medications   Current Outpatient Rx  Name Route Sig Dispense Refill  . IBUPROFEN 600 MG PO TABS Oral Take 600 mg by mouth every 6 (six) hours as needed. pain    . TRAMADOL HCL 50 MG PO TABS Oral Take 50 mg by mouth every 6 (six) hours as needed. pain    . FLUCONAZOLE 150 MG PO TABS  1 tab po x 1. May repeat in 72 hours if no  improvement 2 tablet 0  . METRONIDAZOLE 500 MG PO TABS Oral Take 1 tablet (500 mg total) by mouth 2 (two) times daily. X 7 days 14 tablet 0    BP 134/64  Pulse 57  Temp 97.9 F (36.6 C) (Oral)  Resp 16  SpO2 99%  Physical Exam  Nursing note and vitals reviewed. Constitutional: She is oriented to person, place, and time. She appears well-developed and well-nourished. No distress.  HENT:  Head: Normocephalic and atraumatic.  Right Ear: External ear normal.  Left Ear: External ear normal.  Nose: Nose normal.  Mouth/Throat: Oropharynx  is clear and moist. No oropharyngeal exudate.  Eyes: Conjunctivae and EOM are normal. Pupils are equal, round, and reactive to light. Right eye exhibits no discharge. Left eye exhibits no discharge.  Neck: Normal range of motion. Neck supple. No JVD present. No tracheal deviation present. No thyromegaly present.  Cardiovascular: Normal rate, regular rhythm, normal heart sounds and intact distal pulses.  Exam reveals no gallop and no friction rub.   No murmur heard. Pulmonary/Chest: Effort normal and breath sounds normal. No respiratory distress. She has no wheezes. She has no rales. She exhibits tenderness (sharp specific tenderness over the right lower mid axillary chest wall).  Abdominal: Soft. Bowel sounds are normal. She exhibits no distension. There is no tenderness. There is no rebound and no guarding.       Negative Murphy's sign no significant right upper quadrant tenderness.  Musculoskeletal: Normal range of motion.  Lymphadenopathy:    She has no cervical adenopathy.  Neurological: She is alert and oriented to person, place, and time. No cranial nerve deficit. Coordination normal.  Skin: Skin is warm. No rash noted. She is not diaphoretic.  Psychiatric: She has a normal mood and affect. Her behavior is normal. Judgment and thought content normal.    ED Course  Procedures (including critical care time)  Labs Reviewed - No data to display Dg Chest 2 View  08/31/2011  *RADIOLOGY REPORT*  Clinical Data: Right lateral chest pain for 1 week, no trauma.  50 pack-year smoking history.  CHEST - 2 VIEW  Comparison: 04/14/2008  Findings: Cardiomediastinal silhouette is within normal limits. The lungs are clear. No pleural effusion.  No pneumothorax.  No acute osseous abnormality.  IMPRESSION: No acute cardiopulmonary process.  Original Report Authenticated By: Harrel Lemon, M.D.     No diagnosis found.    MDM  66 year old female patient with past medical history of cervical and  ovarian cancer status post bilateral oophorectomy and hysterectomy presents as a transfer from urgent care clinic from concern of right chest pain. Patient says chest pains been there constantly for a week worse with movement palpation and deep breathing. Patient denies history of trauma to the region. No nausea vomiting fevers. Patient with low risk for ACS given the nature of the pain. Patient Wells score 0 as she is not tachypneic tachycardic not currently with cancer. Patient pain reproducible on exam with normal bowel exam and no Murphy sign. Pain not worse with eating normal stooling and urination.per her the urgent care clinic before patient had evidence of candidal vaginitis obesity and has been given at prescription the treatment for this. She was sent here given the concern for the right-sided pain. Given the exam and the well appearance of the patient presents probability is low for ACS or pulmonary embolism. Will get screening chest x-ray and will help treat pain here in the ED otherwise patient appears well.   DG  Chest 2 View (Final result)   Result time:08/31/11 1724    Final result by Rad Results In Interface (08/31/11 17:24:30)    Narrative:   *RADIOLOGY REPORT*  Clinical Data: Right lateral chest pain for 1 week, no trauma. 50 pack-year smoking history.  CHEST - 2 VIEW  Comparison: 04/14/2008  Findings: Cardiomediastinal silhouette is within normal limits. The lungs are clear. No pleural effusion. No pneumothorax. No acute osseous abnormality.  IMPRESSION: No acute cardiopulmonary process.  Original Report Authenticated By: Harrel Lemon, M.D.         Normal chest x-ray, pain improves with Toradol. Patient will be discharged with prescription for Percocet for pain and instructions to follow PCP for recheck of her right-sided chest wall pain  Case discussed with Dr. Sharlot Gowda, MD 08/31/11 2030

## 2011-08-31 NOTE — ED Notes (Signed)
Right side pain, tenderness, painful with breathing:inhale or exhale .  No known injury.  No cough/cold symptoms.  This pain for one week.  And, for 3 days has noticed painful urination.  Pain lower abdomen.  No different back pain.  Raw in urethral area per patient.  Patient has noticed odor and vaginal discharge.

## 2011-08-31 NOTE — ED Provider Notes (Signed)
History     CSN: [id number]  Arrival date & time 08/31/11  1128   First MD Initiated Contact with Patient 08/31/11 1131      Chief Complaint  Patient presents with  . Urinary Tract Infection    (Consider location/radiation/quality/duration/timing/severity/associated sxs/prior treatment) HPI Comments: Patient presents with 2 issues: First, patient reports constant, sharp, right lower rib pain worse with taking deep breaths, torso rotation, movement. No radiation to back, neck or arm. He states the heaviest thing that she lifted and was a case of water, but is unable to remember if her symptoms started before or after doing this. Taking ibuprofen 600 mg and tramadol without improvement.Pain is not affected with  eating, fasting, urination. No other change in physical activity. No nausea, vomiting, fevers, rash. No coughing, wheezing, shortness of breath. No unintentional weight loss. No constipation reports loose bowel movements for the past 3 days which also did not change her chest pain. She denies immobilization for 3 days, surgery in the last 4 weeks, history of DVT or PE, no cysts, cancer treatment last 6 months her current palliative care. She does have a history of cervical cancer which required hysterectomy 40 years ago, and bilateral oophorectomy secondary to ovarian tumors 20 years ago. She reports having cancerous colon polyps removed as well. She is a heavy smoker.   Second, patient reports genital pain, sensation of being "raw". Reports dysuria, oderous vaginal discharge for the past 3 days. Reports lower, discomfort when urinating. No cloudy, oderous urine, hematuria. No genital rash. No aggravating or alleviating factors. She's not tried anything for this. She is sexually active with a long-term single female partner, who is asymptomatic. Last sexual intercourse was one week ago. Has a history of vaginal yeast infections, UTIs. No history of BV, trichomonas, gonorrhea, Chlamydia, herpes,  syphilis, HIV.   ROS as noted in HPI. All other ROS negative.   Patient is a 66 y.o. female presenting with urinary tract infection and chest pain. No language interpreter was used.  Urinary Tract Infection This is a new problem. The current episode started more than 2 days ago. The problem occurs constantly. The problem has not changed since onset.Associated symptoms include chest pain and abdominal pain. Pertinent negatives include no shortness of breath. Nothing aggravates the symptoms. Nothing relieves the symptoms. She has tried nothing for the symptoms. The treatment provided no relief.  Chest Pain The chest pain began 5 - 7 days ago. Chest pain occurs constantly. The chest pain is unchanged. The pain is associated with breathing and coughing. The severity of the pain is moderate. The quality of the pain is described as aching and sharp. The pain does not radiate. Chest pain is worsened by certain positions and deep breathing. Primary symptoms include abdominal pain. Pertinent negatives for primary symptoms include no fever, no fatigue, no shortness of breath, no cough, no wheezing, no palpitations and no vomiting. She tried NSAIDs for the symptoms. Risk factors include being elderly, post-menopausal and smoking/tobacco exposure.  Her past medical history is significant for cancer.  Pertinent negatives for past medical history include no CAD, no COPD, no CHF, no diabetes, no DVT, no hyperlipidemia, no hypertension, no MI and no PE.     Past Medical History  Diagnosis Date  . Cancer   . Cervical cancer   . Tumors     5tumors removed  . Bilateral ovarian tumors   . Dysplastic colon polyp   . Yeast vaginitis   . UTI (lower urinary tract  infection)     Past Surgical History  Procedure Date  . Abdominal hysterectomy   . Tubal ligation   . Tonsillectomy   . Bladder surgery   . Breast lumpectomy   . Tumor excision     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  . Oophrectomy     . Bladder neck suspension   . Appendectomy     History reviewed. No pertinent family history.  History  Substance Use Topics  . Smoking status: Current Everyday Smoker    Types: Cigarettes  . Smokeless tobacco: Not on file  . Alcohol Use: Yes    OB History    Grav Para Term Preterm Abortions TAB SAB Ect Mult Living                  Review of Systems  Constitutional: Negative for fever and fatigue.  Respiratory: Negative for cough, shortness of breath and wheezing.   Cardiovascular: Positive for chest pain. Negative for palpitations.  Gastrointestinal: Positive for abdominal pain. Negative for vomiting.    Allergies  Codeine  Home Medications   Current Outpatient Rx  Name Route Sig Dispense Refill  . IBUPROFEN 600 MG PO TABS Oral Take 600 mg by mouth every 6 (six) hours as needed. As needed    . TRAMADOL HCL 50 MG PO TABS Oral Take 50 mg by mouth every 6 (six) hours as needed. As needed    . FLUCONAZOLE 150 MG PO TABS  1 tab po x 1. May repeat in 72 hours if no improvement 2 tablet 0  . METRONIDAZOLE 500 MG PO TABS Oral Take 1 tablet (500 mg total) by mouth 2 (two) times daily. X 7 days 14 tablet 0    BP 112/69  Pulse 66  Temp 98.4 F (36.9 C) (Oral)  Resp 16  SpO2 97%  Physical Exam  Nursing note and vitals reviewed. Constitutional: She is oriented to person, place, and time. She appears well-developed and well-nourished. No distress.  HENT:  Head: Normocephalic and atraumatic.  Eyes: EOM are normal.  Neck: Normal range of motion.  Cardiovascular: Normal rate, regular rhythm and normal heart sounds.   Pulmonary/Chest: Effort normal. No respiratory distress. She has no wheezes. She has no rales. She exhibits tenderness.  Abdominal: Soft. Normal appearance and bowel sounds are normal. She exhibits no distension. There is tenderness in the right upper quadrant and suprapubic area. There is no rebound, no guarding and no CVA tenderness.         Tenderness  right lower ribs, see drawing. No rash, bruising, crepitus. right upper quadrant and suprapubic tenderness.  Genitourinary: Pelvic exam was performed with patient supine. There is no rash on the right labia. There is no rash on the left labia. Right adnexum displays no mass and no tenderness. Left adnexum displays no mass and no tenderness. No erythema, tenderness or bleeding around the vagina. No foreign body around the vagina. Vaginal discharge found.       Thin  white nonoderous vaginal d/c. surgical cuff intact. Vaginal tissue friable. Chaperone present during exam  Musculoskeletal: Normal range of motion.  Neurological: She is alert and oriented to person, place, and time.  Skin: Skin is warm and dry.  Psychiatric: She has a normal mood and affect. Her behavior is normal. Judgment and thought content normal.    ED Course  Procedures (including critical care time)  Labs Reviewed  POCT URINALYSIS DIP (DEVICE) - Abnormal; Notable for the following:    Glucose,  UA 500 (*)     Hgb urine dipstick MODERATE (*)     Leukocytes, UA LARGE (*)  Biochemical Testing Only. Please order routine urinalysis from main lab if confirmatory testing is needed.   All other components within normal limits  WET PREP, GENITAL  GC/CHLAMYDIA PROBE AMP, GENITAL   No results found.   1. RUQ pain   2. Pleuritic chest pain   3. BV (bacterial vaginosis)    Results for orders placed during the hospital encounter of 08/31/11  POCT URINALYSIS DIP (DEVICE)      Component Value Range   Glucose, UA 500 (*) NEGATIVE mg/dL   Bilirubin Urine NEGATIVE  NEGATIVE   Ketones, ur NEGATIVE  NEGATIVE mg/dL   Specific Gravity, Urine <=1.005  1.005 - 1.030   Hgb urine dipstick MODERATE (*) NEGATIVE   pH 5.5  5.0 - 8.0   Protein, ur NEGATIVE  NEGATIVE mg/dL   Urobilinogen, UA 0.2  0.0 - 1.0 mg/dL   Nitrite NEGATIVE  NEGATIVE   Leukocytes, UA LARGE (*) NEGATIVE      MDM   patient's GU presentation is consistent with BV.  Udip noted. This is most likely from her GU infection.   Second, Elderly patient with an extensive oncological history, long-term smoker with sharp, constant, right sided rib and right quadrant pain for the past week. Worse with position, deep inspiration.  She is significantly tender in her lower ribs and midaxillary line anteriorly, also has right upper quadrant tenderness. Respiratory effort limited secondary to pain. No rash suggestive of shingles. No signs of trauma. Vitals are acceptable, not tachycardic or hypoxic, but concern for serious cause of her pain such as pleural effusion, mets. Gallbladder etiology less likely as it is not affected with eating. She also has BV, for which I am sending home with prescriptions already. Transferring to the ED for further imaging.    Luiz Blare, MD 08/31/11 1314

## 2011-09-01 ENCOUNTER — Telehealth (HOSPITAL_COMMUNITY): Payer: Self-pay | Admitting: *Deleted

## 2011-09-01 LAB — GC/CHLAMYDIA PROBE AMP, GENITAL: Chlamydia, DNA Probe: NEGATIVE

## 2011-09-01 NOTE — ED Notes (Signed)
GC/Chlamydia neg., Wet prep: Few trich, few clue cells, many WBC's.  Pt. adequately treated with Flagyl. I called and left a message to call. Vassie Moselle 09/01/2011

## 2011-09-02 ENCOUNTER — Telehealth (HOSPITAL_COMMUNITY): Payer: Self-pay | Admitting: *Deleted

## 2011-09-02 NOTE — ED Notes (Signed)
Pt. called back.  Pt. verified x 2 and given results.  Pt. told she was adequately treated and instructed to finish all of Flagyl.  Instructed to notify her partner to be treated with Flagyl, no sex until you have finished your medication and your partner has been treated and to practice safe sex. You can get HIV testing at the [facility] STD clinic. Vassie Moselle 09/02/2011

## 2011-10-18 ENCOUNTER — Emergency Department (HOSPITAL_COMMUNITY): Payer: No Typology Code available for payment source

## 2011-10-18 ENCOUNTER — Encounter (HOSPITAL_COMMUNITY): Payer: Self-pay | Admitting: *Deleted

## 2011-10-18 ENCOUNTER — Inpatient Hospital Stay (HOSPITAL_COMMUNITY)
Admission: EM | Admit: 2011-10-18 | Discharge: 2011-10-20 | DRG: 512 | Disposition: A | Discharge status: Home / Self Care | Payer: No Typology Code available for payment source | Attending: Orthopedic Surgery | Admitting: Orthopedic Surgery

## 2011-10-18 DIAGNOSIS — S52209A Unspecified fracture of shaft of unspecified ulna, initial encounter for closed fracture: Principal | ICD-10-CM | POA: Diagnosis present

## 2011-10-18 DIAGNOSIS — J4489 Other specified chronic obstructive pulmonary disease: Secondary | ICD-10-CM | POA: Diagnosis present

## 2011-10-18 DIAGNOSIS — Y92410 Unspecified street and highway as the place of occurrence of the external cause: Secondary | ICD-10-CM

## 2011-10-18 DIAGNOSIS — J449 Chronic obstructive pulmonary disease, unspecified: Secondary | ICD-10-CM | POA: Diagnosis present

## 2011-10-18 DIAGNOSIS — S5290XA Unspecified fracture of unspecified forearm, initial encounter for closed fracture: Secondary | ICD-10-CM

## 2011-10-18 DIAGNOSIS — Z8541 Personal history of malignant neoplasm of cervix uteri: Secondary | ICD-10-CM

## 2011-10-18 DIAGNOSIS — F17200 Nicotine dependence, unspecified, uncomplicated: Secondary | ICD-10-CM | POA: Diagnosis present

## 2011-10-18 DIAGNOSIS — Z87440 Personal history of urinary (tract) infections: Secondary | ICD-10-CM

## 2011-10-18 DIAGNOSIS — F329 Major depressive disorder, single episode, unspecified: Secondary | ICD-10-CM | POA: Diagnosis present

## 2011-10-18 DIAGNOSIS — S63016A Dislocation of distal radioulnar joint of unspecified wrist, initial encounter: Secondary | ICD-10-CM | POA: Diagnosis present

## 2011-10-18 DIAGNOSIS — F3289 Other specified depressive episodes: Secondary | ICD-10-CM | POA: Diagnosis present

## 2011-10-18 DIAGNOSIS — Z9089 Acquired absence of other organs: Secondary | ICD-10-CM

## 2011-10-18 DIAGNOSIS — S8010XA Contusion of unspecified lower leg, initial encounter: Secondary | ICD-10-CM | POA: Diagnosis present

## 2011-10-18 DIAGNOSIS — Z885 Allergy status to narcotic agent status: Secondary | ICD-10-CM

## 2011-10-18 DIAGNOSIS — S62109A Fracture of unspecified carpal bone, unspecified wrist, initial encounter for closed fracture: Secondary | ICD-10-CM

## 2011-10-18 DIAGNOSIS — Z90710 Acquired absence of both cervix and uterus: Secondary | ICD-10-CM

## 2011-10-18 DIAGNOSIS — Z9079 Acquired absence of other genital organ(s): Secondary | ICD-10-CM

## 2011-10-18 DIAGNOSIS — S52309A Unspecified fracture of shaft of unspecified radius, initial encounter for closed fracture: Principal | ICD-10-CM | POA: Diagnosis present

## 2011-10-18 HISTORY — DX: Unspecified fracture of unspecified forearm, initial encounter for closed fracture: S52.90XA

## 2011-10-18 LAB — URINE MICROSCOPIC-ADD ON

## 2011-10-18 LAB — PROTIME-INR: Prothrombin Time: 13.1 seconds (ref 11.6–15.2)

## 2011-10-18 LAB — BASIC METABOLIC PANEL
CO2: 24 mEq/L (ref 19–32)
Chloride: 105 mEq/L (ref 96–112)
GFR calc Af Amer: >90 mL/min (ref >90)
Potassium: 3.7 mEq/L (ref 3.5–5.1)

## 2011-10-18 LAB — CBC WITH DIFFERENTIAL/PLATELET
Basophils Absolute: 0.1 10*3/uL (ref 0.0–0.1)
Basophils Relative: 0 % (ref 0–1)
Hemoglobin: 13.8 g/dL (ref 12.0–15.0)
Lymphocytes Relative: 31 % (ref 12–46)
MCHC: 34.2 g/dL (ref 30.0–36.0)
Monocytes Relative: 7 % (ref 3–12)
Neutro Abs: 7.6 10*3/uL (ref 1.7–7.7)
Neutrophils Relative %: 60 % (ref 43–77)
RDW: 13.2 % (ref 11.5–15.5)
WBC: 12.6 10*3/uL — ABNORMAL HIGH (ref 4.0–10.5)

## 2011-10-18 LAB — URINALYSIS, ROUTINE W REFLEX MICROSCOPIC
Ketones, ur: NEGATIVE mg/dL
Nitrite: NEGATIVE
Specific Gravity, Urine: 1.024 (ref 1.005–1.030)
Urobilinogen, UA: 1 mg/dL (ref 0.0–1.0)

## 2011-10-18 MED ORDER — HYDROMORPHONE HCL PF 1 MG/ML IJ SOLN
1.0000 mg | Freq: Once | INTRAMUSCULAR | Status: AC
  Administered 2011-10-18: 1 mg via INTRAVENOUS
  Filled 2011-10-18: qty 1

## 2011-10-18 MED ORDER — METHOCARBAMOL 500 MG PO TABS
500.0000 mg | ORAL_TABLET | Freq: Four times a day (QID) | ORAL | Status: DC | PRN
  Administered 2011-10-18: 500 mg via ORAL
  Filled 2011-10-18: qty 1

## 2011-10-18 MED ORDER — OXYCODONE HCL 5 MG PO TABS
5.0000 mg | ORAL_TABLET | ORAL | Status: DC | PRN
  Administered 2011-10-19 – 2011-10-20 (×3): 10 mg via ORAL
  Filled 2011-10-18 (×3): qty 2

## 2011-10-18 MED ORDER — ONDANSETRON HCL 4 MG PO TABS: 4.0000 mg | ORAL_TABLET | Freq: Four times a day (QID) | ORAL | Status: DC | PRN

## 2011-10-18 MED ORDER — CEFAZOLIN SODIUM-DEXTROSE 2-3 GM-% IV SOLR
2.0000 g | INTRAVENOUS | Status: AC
  Administered 2011-10-19: 2 g via INTRAVENOUS
  Filled 2011-10-18 (×2): qty 50

## 2011-10-18 MED ORDER — ADULT MULTIVITAMIN W/MINERALS CH
1.0000 | ORAL_TABLET | Freq: Every day | ORAL | Status: DC
  Filled 2011-10-18 (×2): qty 1

## 2011-10-18 MED ORDER — ONDANSETRON HCL 4 MG/2ML IJ SOLN
4.0000 mg | Freq: Four times a day (QID) | INTRAMUSCULAR | Status: DC | PRN
  Administered 2011-10-18 – 2011-10-19 (×2): 4 mg via INTRAVENOUS
  Filled 2011-10-18 (×2): qty 2

## 2011-10-18 MED ORDER — CHLORHEXIDINE GLUCONATE 4 % EX LIQD
60.0000 mL | Freq: Once | CUTANEOUS | Status: DC
  Filled 2011-10-18 (×2): qty 60

## 2011-10-18 MED ORDER — METHOCARBAMOL 100 MG/ML IJ SOLN
500.0000 mg | Freq: Four times a day (QID) | INTRAVENOUS | Status: DC | PRN
  Filled 2011-10-18: qty 5

## 2011-10-18 MED ORDER — HYDROMORPHONE HCL PF 1 MG/ML IJ SOLN
1.0000 mg | INTRAMUSCULAR | Status: DC | PRN
  Administered 2011-10-18: 1 mg via INTRAVENOUS
  Filled 2011-10-18: qty 1

## 2011-10-18 MED ORDER — HYDROCODONE-ACETAMINOPHEN 5-325 MG PO TABS: 1.0000 | ORAL_TABLET | ORAL | Status: DC | PRN

## 2011-10-18 MED ORDER — DIPHENHYDRAMINE HCL 25 MG PO CAPS: 25.0000 mg | ORAL_CAPSULE | Freq: Four times a day (QID) | ORAL | Status: DC | PRN

## 2011-10-18 MED ORDER — ONDANSETRON HCL 4 MG/2ML IJ SOLN
4.0000 mg | Freq: Once | INTRAMUSCULAR | Status: AC
  Administered 2011-10-18: 4 mg via INTRAVENOUS
  Filled 2011-10-18: qty 2

## 2011-10-18 MED ORDER — VITAMIN C 500 MG PO TABS
1000.0000 mg | ORAL_TABLET | Freq: Every day | ORAL | Status: DC
  Administered 2011-10-20: 1000 mg via ORAL
  Filled 2011-10-18 (×3): qty 2

## 2011-10-18 MED ORDER — HYDROMORPHONE HCL PF 1 MG/ML IJ SOLN
0.5000 mg | INTRAMUSCULAR | Status: DC | PRN
  Administered 2011-10-18 – 2011-10-20 (×7): 1 mg via INTRAVENOUS
  Filled 2011-10-18 (×6): qty 1

## 2011-10-18 MED ORDER — DOCUSATE SODIUM 100 MG PO CAPS
100.0000 mg | ORAL_CAPSULE | Freq: Two times a day (BID) | ORAL | Status: DC
  Filled 2011-10-18 (×3): qty 1

## 2011-10-18 MED ORDER — KCL IN DEXTROSE-NACL 20-5-0.45 MEQ/L-%-% IV SOLN
INTRAVENOUS | Status: DC
  Administered 2011-10-19 – 2011-10-20 (×3): via INTRAVENOUS
  Filled 2011-10-18 (×5): qty 1000
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
## 2011-10-18 NOTE — Progress Notes (Signed)
Orthopedic Tech Progress Note Patient Details:  Kathleen Ramirez 01/14/1946 [id number] Sugartong splint applied to Right UE, instructions given. Family present. Ortho Devices Type of Ortho Device: Sugartong splint Ortho Device/Splint Location: Sugartong splint ap[plied to Right UE Ortho Device/Splint Interventions: Application   Asia R Thompson 10/18/2011, 9:07 PM

## 2011-10-19 ENCOUNTER — Encounter (HOSPITAL_COMMUNITY)
Admission: EM | Disposition: A | Discharge status: Home / Self Care | Payer: Self-pay | Source: Home / Self Care | Attending: Orthopedic Surgery

## 2011-10-19 ENCOUNTER — Encounter (HOSPITAL_COMMUNITY): Payer: Self-pay | Admitting: Anesthesiology

## 2011-10-19 ENCOUNTER — Inpatient Hospital Stay (HOSPITAL_COMMUNITY): Payer: No Typology Code available for payment source | Admitting: Anesthesiology

## 2011-10-19 ENCOUNTER — Encounter (HOSPITAL_COMMUNITY): Payer: Self-pay | Admitting: *Deleted

## 2011-10-19 HISTORY — PX: ORIF ULNAR FRACTURE: SHX5417

## 2011-10-19 LAB — SURGICAL PCR SCREEN
MRSA, PCR: NEGATIVE
Staphylococcus aureus: NEGATIVE

## 2011-10-19 SURGERY — OPEN REDUCTION INTERNAL FIXATION (ORIF) ULNAR FRACTURE
Anesthesia: General | Site: Arm Lower | Laterality: Right | Wound class: Clean

## 2011-10-19 MED ORDER — CEFAZOLIN SODIUM 1-5 GM-% IV SOLN
1.0000 g | Freq: Three times a day (TID) | INTRAVENOUS | Status: DC
  Administered 2011-10-19 – 2011-10-20 (×2): 1 g via INTRAVENOUS
  Filled 2011-10-19 (×4): qty 50

## 2011-10-19 MED ORDER — ADULT MULTIVITAMIN W/MINERALS CH
1.0000 | ORAL_TABLET | Freq: Every day | ORAL | Status: DC
  Administered 2011-10-20: 1 via ORAL
  Filled 2011-10-19 (×2): qty 1

## 2011-10-19 MED ORDER — BUPIVACAINE HCL (PF) 0.25 % IJ SOLN
INTRAMUSCULAR | Status: DC | PRN
  Administered 2011-10-19: 10 mL

## 2011-10-19 MED ORDER — PROMETHAZINE HCL 25 MG RE SUPP: 12.5000 mg | Freq: Four times a day (QID) | RECTAL | Status: DC | PRN

## 2011-10-19 MED ORDER — PANTOPRAZOLE SODIUM 40 MG PO TBEC
40.0000 mg | DELAYED_RELEASE_TABLET | Freq: Every day | ORAL | Status: DC
  Administered 2011-10-19 – 2011-10-20 (×2): 40 mg via ORAL
  Filled 2011-10-19 (×2): qty 1

## 2011-10-19 MED ORDER — LIDOCAINE HCL (CARDIAC) 20 MG/ML IV SOLN
INTRAVENOUS | Status: DC | PRN
  Administered 2011-10-19: 100 mg via INTRAVENOUS

## 2011-10-19 MED ORDER — VITAMIN C 500 MG PO TABS: 1000.0000 mg | ORAL_TABLET | Freq: Every day | ORAL | Status: DC

## 2011-10-19 MED ORDER — 0.9 % SODIUM CHLORIDE (POUR BTL) OPTIME
TOPICAL | Status: DC | PRN
  Administered 2011-10-19: 1000 mL

## 2011-10-19 MED ORDER — HYDROMORPHONE HCL PF 1 MG/ML IJ SOLN
0.2500 mg | INTRAMUSCULAR | Status: DC | PRN
  Administered 2011-10-19 (×4): 0.5 mg via INTRAVENOUS

## 2011-10-19 MED ORDER — ONDANSETRON HCL 4 MG/2ML IJ SOLN: 4.0000 mg | Freq: Four times a day (QID) | INTRAMUSCULAR | Status: DC | PRN

## 2011-10-19 MED ORDER — EPHEDRINE SULFATE 50 MG/ML IJ SOLN
INTRAMUSCULAR | Status: DC | PRN
  Administered 2011-10-19 (×5): 10 mg via INTRAVENOUS

## 2011-10-19 MED ORDER — MIDAZOLAM HCL 5 MG/5ML IJ SOLN
INTRAMUSCULAR | Status: DC | PRN
  Administered 2011-10-19: 2 mg via INTRAVENOUS

## 2011-10-19 MED ORDER — PHENYLEPHRINE HCL 10 MG/ML IJ SOLN
INTRAMUSCULAR | Status: DC | PRN
  Administered 2011-10-19 (×5): 80 ug via INTRAVENOUS

## 2011-10-19 MED ORDER — DIPHENHYDRAMINE HCL 25 MG PO CAPS: 25.0000 mg | ORAL_CAPSULE | Freq: Four times a day (QID) | ORAL | Status: DC | PRN

## 2011-10-19 MED ORDER — CEFAZOLIN SODIUM 1-5 GM-% IV SOLN
1.0000 g | INTRAVENOUS | Status: AC
  Administered 2011-10-19: 1 g via INTRAVENOUS

## 2011-10-19 MED ORDER — DEXAMETHASONE SODIUM PHOSPHATE 4 MG/ML IJ SOLN
INTRAMUSCULAR | Status: DC | PRN
  Administered 2011-10-19: 8 mg via INTRAVENOUS

## 2011-10-19 MED ORDER — ONDANSETRON HCL 4 MG PO TABS: 4.0000 mg | ORAL_TABLET | Freq: Four times a day (QID) | ORAL | Status: DC | PRN

## 2011-10-19 MED ORDER — HYDROCODONE-ACETAMINOPHEN 7.5-325 MG PO TABS
1.0000 | ORAL_TABLET | ORAL | Status: DC | PRN
  Administered 2011-10-19: 2 via ORAL
  Filled 2011-10-19: qty 2

## 2011-10-19 MED ORDER — OXYCODONE HCL 5 MG/5ML PO SOLN: 5.0000 mg | Freq: Once | ORAL | Status: DC | PRN

## 2011-10-19 MED ORDER — LACTATED RINGERS IV SOLN
INTRAVENOUS | Status: DC | PRN
  Administered 2011-10-19 (×2): via INTRAVENOUS

## 2011-10-19 MED ORDER — ONDANSETRON HCL 4 MG/2ML IJ SOLN
INTRAMUSCULAR | Status: DC | PRN
  Administered 2011-10-19: 4 mg via INTRAVENOUS

## 2011-10-19 MED ORDER — PROPOFOL 10 MG/ML IV EMUL
INTRAVENOUS | Status: DC | PRN
  Administered 2011-10-19: 200 mg via INTRAVENOUS
  Administered 2011-10-19: 50 mg via INTRAVENOUS

## 2011-10-19 MED ORDER — DOCUSATE SODIUM 100 MG PO CAPS
100.0000 mg | ORAL_CAPSULE | Freq: Two times a day (BID) | ORAL | Status: DC
  Administered 2011-10-20: 100 mg via ORAL
  Filled 2011-10-19 (×2): qty 1

## 2011-10-19 MED ORDER — FENTANYL CITRATE 0.05 MG/ML IJ SOLN
INTRAMUSCULAR | Status: DC | PRN
  Administered 2011-10-19 (×3): 50 ug via INTRAVENOUS
  Administered 2011-10-19: 100 ug via INTRAVENOUS

## 2011-10-19 MED ORDER — OXYCODONE HCL 5 MG PO TABS: 5.0000 mg | ORAL_TABLET | Freq: Once | ORAL | Status: DC | PRN

## 2011-10-19 MED ORDER — METOCLOPRAMIDE HCL 5 MG/ML IJ SOLN: 10.0000 mg | Freq: Once | INTRAMUSCULAR | Status: DC | PRN

## 2011-10-19 MED ORDER — SUCCINYLCHOLINE CHLORIDE 20 MG/ML IJ SOLN
INTRAMUSCULAR | Status: DC | PRN
  Administered 2011-10-19: 100 mg via INTRAVENOUS

## 2011-10-19 MED ORDER — METOCLOPRAMIDE HCL 5 MG/ML IJ SOLN
INTRAMUSCULAR | Status: DC | PRN
  Administered 2011-10-19: 10 mg via INTRAVENOUS

## 2011-10-19 MED ORDER — HYDROMORPHONE HCL PF 1 MG/ML IJ SOLN
0.5000 mg | INTRAMUSCULAR | Status: DC | PRN
  Filled 2011-10-19: qty 1

## 2011-10-19 SURGICAL SUPPLY — 67 items
BANDAGE ELASTIC 3 VELCRO ST LF (GAUZE/BANDAGES/DRESSINGS) ×2 IMPLANT
BANDAGE ELASTIC 4 VELCRO ST LF (GAUZE/BANDAGES/DRESSINGS) ×2 IMPLANT
BANDAGE GAUZE ELAST BULKY 4 IN (GAUZE/BANDAGES/DRESSINGS) ×2 IMPLANT
BIT DRILL 2 FAST STEP (BIT) ×1 IMPLANT
BIT DRILL 2.5X2.75 QC CALB (BIT) ×1 IMPLANT
BIT DRILL CALIBRATED 2.7 (BIT) ×1 IMPLANT
BLADE SURG ROTATE 9660 (MISCELLANEOUS) IMPLANT
BNDG CMPR 9X4 STRL LF SNTH (GAUZE/BANDAGES/DRESSINGS) ×1
BNDG ESMARK 4X9 LF (GAUZE/BANDAGES/DRESSINGS) ×2 IMPLANT
CLOTH BEACON ORANGE TIMEOUT ST (SAFETY) ×2 IMPLANT
CORDS BIPOLAR (ELECTRODE) ×2 IMPLANT
COVER SURGICAL LIGHT HANDLE (MISCELLANEOUS) ×2 IMPLANT
CUFF TOURNIQUET SINGLE 18IN (TOURNIQUET CUFF) ×2 IMPLANT
CUFF TOURNIQUET SINGLE 24IN (TOURNIQUET CUFF) IMPLANT
DRAIN TLS ROUND 10FR (DRAIN) IMPLANT
DRAPE OEC MINIVIEW 54X84 (DRAPES) ×2 IMPLANT
DRAPE SURG 17X11 SM STRL (DRAPES) ×2 IMPLANT
DRSG ADAPTIC 3X8 NADH LF (GAUZE/BANDAGES/DRESSINGS) ×1 IMPLANT
ELECT REM PT RETURN 9FT ADLT (ELECTROSURGICAL)
ELECTRODE REM PT RTRN 9FT ADLT (ELECTROSURGICAL) IMPLANT
GAUZE SPONGE 4X4 16PLY XRAY LF (GAUZE/BANDAGES/DRESSINGS) ×2 IMPLANT
GAUZE XEROFORM 5X9 LF (GAUZE/BANDAGES/DRESSINGS) ×1 IMPLANT
GLOVE BIOGEL PI IND STRL 8.5 (GLOVE) ×1 IMPLANT
GLOVE BIOGEL PI INDICATOR 8.5 (GLOVE) ×1
GLOVE SURG ORTHO 8.0 STRL STRW (GLOVE) ×2 IMPLANT
GOWN PREVENTION PLUS XLARGE (GOWN DISPOSABLE) ×3 IMPLANT
GOWN STRL NON-REIN LRG LVL3 (GOWN DISPOSABLE) ×3 IMPLANT
K-WIRE ACE 1.6X6 (WIRE) ×4
KIT BASIN OR (CUSTOM PROCEDURE TRAY) ×2 IMPLANT
KIT ROOM TURNOVER OR (KITS) ×2 IMPLANT
KWIRE ACE 1.6X6 (WIRE) IMPLANT
MANIFOLD NEPTUNE II (INSTRUMENTS) ×1 IMPLANT
NDL HYPO 25X1 1.5 SAFETY (NEEDLE) ×1 IMPLANT
NEEDLE HYPO 25X1 1.5 SAFETY (NEEDLE) IMPLANT
NS IRRIG 1000ML POUR BTL (IV SOLUTION) ×2 IMPLANT
PACK ORTHO EXTREMITY (CUSTOM PROCEDURE TRAY) ×2 IMPLANT
PAD ARMBOARD 7.5X6 YLW CONV (MISCELLANEOUS) ×3 IMPLANT
PAD CAST 4YDX4 CTTN HI CHSV (CAST SUPPLIES) ×1 IMPLANT
PADDING CAST COTTON 4X4 STRL (CAST SUPPLIES) ×2
PLATE F3 FRAG HI STR Y (Plate) ×1 IMPLANT
PLATE LOCK 7H 92 BILAT FIB (Plate) ×1 IMPLANT
PLATE LOCK COMP 6H FOOT (Plate) IMPLANT
SCREW LOCK CORT STAR 3.5X12 (Screw) ×2 IMPLANT
SCREW NON LOCKING LP 3.5 14MM (Screw) ×3 IMPLANT
SCREW NON LOCKING LP 3.5 16MM (Screw) ×1 IMPLANT
SCREW PEG 2.5X12 NONLOCK (Screw) ×1 IMPLANT
SCREW PEG LOCK 2.5X12 (Screw) ×3 IMPLANT
SCREW PEG LOCK 2.5X16 (Peg) ×1 IMPLANT
SCREW PEG LOCK 2.5X18 (Peg) ×1 IMPLANT
SOAP 2 % CHG 4 OZ (WOUND CARE) ×2 IMPLANT
SPLINT FIBERGLASS 3X35 (CAST SUPPLIES) ×1 IMPLANT
SPONGE GAUZE 4X4 12PLY (GAUZE/BANDAGES/DRESSINGS) ×2 IMPLANT
SPONGE LAP 4X18 X RAY DECT (DISPOSABLE) ×1 IMPLANT
STRIP CLOSURE SKIN 1/2X4 (GAUZE/BANDAGES/DRESSINGS) IMPLANT
SUCTION FRAZIER TIP 10 FR DISP (SUCTIONS) ×2 IMPLANT
SUT ETHILON 4 0 PS 2 18 (SUTURE) ×1 IMPLANT
SUT MNCRL AB 4-0 PS2 18 (SUTURE) IMPLANT
SUT PROLENE 4 0 PS 2 18 (SUTURE) ×1 IMPLANT
SUT VIC AB 2-0 FS1 27 (SUTURE) ×2 IMPLANT
SUT VICRYL 4-0 PS2 18IN ABS (SUTURE) ×3 IMPLANT
SYR CONTROL 10ML LL (SYRINGE) IMPLANT
SYSTEM CHEST DRAIN TLS 7FR (DRAIN) IMPLANT
TOWEL OR 17X24 6PK STRL BLUE (TOWEL DISPOSABLE) ×2 IMPLANT
TOWEL OR 17X26 10 PK STRL BLUE (TOWEL DISPOSABLE) ×2 IMPLANT
TUBE CONNECTING 12X1/4 (SUCTIONS) ×2 IMPLANT
WATER STERILE IRR 1000ML POUR (IV SOLUTION) ×2 IMPLANT
YANKAUER SUCT BULB TIP NO VENT (SUCTIONS) IMPLANT

## 2011-10-19 NOTE — Transfer of Care (Signed)
Immediate Anesthesia Transfer of Care Note  Patient: Kathleen Ramirez  Procedure(s) Performed: Procedure(s) (LRB): OPEN REDUCTION INTERNAL FIXATION (ORIF) ULNAR FRACTURE (Right) OPEN REDUCTION INTERNAL FIXATION (ORIF) DISTAL RADIAL FRACTURE (Right)  Patient Location: PACU  Anesthesia Type: General  Level of Consciousness: awake, alert  and oriented  Airway & Oxygen Therapy: Patient Spontanous Breathing and Patient connected to face mask  Post-op Assessment: Report given to PACU RN, Post -op Vital signs reviewed and stable and Patient moving all extremities X 4  Post vital signs: Reviewed and stable  Complications: No apparent anesthesia complications

## 2011-10-19 NOTE — Anesthesia Preprocedure Evaluation (Addendum)
Anesthesia Evaluation  Patient identified by MRN, date of birth, ID band  Reviewed: Allergy & Precautions, H&P , NPO status , Patient's Chart, lab work & pertinent test results, reviewed documented beta blocker date and time   Airway Mallampati: I      Dental  (+) Edentulous Upper and Edentulous Lower   Pulmonary asthma , COPD         Cardiovascular     Neuro/Psych Depression    GI/Hepatic   Endo/Other  Morbid obesity  Renal/GU      Musculoskeletal   Abdominal   Peds  Hematology   Anesthesia Other Findings Patient states N/V this morning  Reproductive/Obstetrics                         Anesthesia Physical Anesthesia Plan  ASA: III  Anesthesia Plan: General   Post-op Pain Management:    Induction: Intravenous, Rapid sequence and Cricoid pressure planned  Airway Management Planned: Oral ETT  Additional Equipment:   Intra-op Plan:   Post-operative Plan: Extubation in OR  Informed Consent:   Plan Discussed with: CRNA, Anesthesiologist and Surgeon  Anesthesia Plan Comments: (See surgeon's H&P )      Anesthesia Quick Evaluation

## 2011-10-19 NOTE — Anesthesia Procedure Notes (Signed)
Procedure Name: Intubation Date/Time: 10/19/2011 9:57 AM Performed by: Quentin Ore Pre-anesthesia Checklist: Patient identified, Emergency Drugs available, Suction available, Patient being monitored and Timeout performed Patient Re-evaluated:Patient Re-evaluated prior to inductionOxygen Delivery Method: Circle system utilized Preoxygenation: Pre-oxygenation with 100% oxygen Intubation Type: Rapid sequence and Cricoid Pressure applied Laryngoscope Size: Mac and 3 Grade View: Grade I Tube type: Oral Tube size: 7.5 mm Number of attempts: 1 Airway Equipment and Method: Stylet Placement Confirmation: ETT inserted through vocal cords under direct vision,  positive ETCO2 and breath sounds checked- equal and bilateral Secured at: 21 cm Tube secured with: Tape Dental Injury: Teeth and Oropharynx as per pre-operative assessment

## 2011-10-19 NOTE — Preoperative (Signed)
Beta Blockers   Reason not to administer Beta Blockers:Not Applicable 

## 2011-10-19 NOTE — Brief Op Note (Signed)
10/18/2011 - 10/19/2011  12:48 PM  PATIENT:  Kathleen Ramirez  66 y.o. female  PRE-OPERATIVE DIAGNOSIS:  Right Forearm Both Bone Fractures  POST-OPERATIVE DIAGNOSIS:  right forearm both bone fracture  PROCEDURE:  Procedure(s) (LRB): OPEN REDUCTION INTERNAL FIXATION (ORIF) ULNAR FRACTURE (Right) OPEN REDUCTION INTERNAL FIXATION (ORIF) DISTAL RADIAL FRACTURE (Right)  SURGEON:  Surgeon(s) and Role:    * Sharma Covert, MD - Primary  PHYSICIAN ASSISTANT:   ASSISTANTS: none   ANESTHESIA:   general  EBL:  Total I/O In: 1800 [I.V.:1800] Out: 1025 [Urine:1000; Blood:25]  BLOOD ADMINISTERED:none  DRAINS: none   LOCAL MEDICATIONS USED:  MARCAINE     SPECIMEN:  No Specimen  DISPOSITION OF SPECIMEN:  N/A  COUNTS:  YES  TOURNIQUET:   Total Tourniquet Time Documented: Upper Arm (Right) - 57 minutes  DICTATION: .191478  PLAN OF CARE: Admit to inpatient   PATIENT DISPOSITION:  PACU - hemodynamically stable.   Delay start of Pharmacological VTE agent (>24hrs) due to surgical blood loss or risk of bleeding: not applicable

## 2011-10-19 NOTE — Progress Notes (Signed)
R/B/A DISCUSSED WITH PT IN HOLDING AREA.  PT VOICED UNDERSTANDING OF PLAN CONSENT SIGNED DAY OF SURGERY PT SEEN AND EXAMINED PRIOR TO OPERATIVE PROCEDURE/DAY OF SURGERY SITE MARKED. QUESTIONS ANSWERED WILL REMAIN AN INPATIENT FOLLOWING SURGERY 

## 2011-10-19 NOTE — Anesthesia Postprocedure Evaluation (Signed)
Anesthesia Post Note  Patient: Kathleen Ramirez  Procedure(s) Performed: Procedure(s) (LRB): OPEN REDUCTION INTERNAL FIXATION (ORIF) ULNAR FRACTURE (Right) OPEN REDUCTION INTERNAL FIXATION (ORIF) DISTAL RADIAL FRACTURE (Right)  Anesthesia type: general  Patient location: PACU  Post pain: Pain level controlled  Post assessment: Patient's Cardiovascular Status Stable  Last Vitals:  Filed Vitals:   10/19/11 1235  BP:   Pulse:   Temp: 36.8 C  Resp:     Post vital signs: Reviewed and stable  Level of consciousness: sedated  Complications: No apparent anesthesia complications

## 2011-10-20 MED ORDER — METHOCARBAMOL 500 MG PO TABS: 500.0000 mg | ORAL_TABLET | Freq: Four times a day (QID) | ORAL | Status: AC | PRN

## 2011-10-20 MED ORDER — ASCORBIC ACID 1000 MG PO TABS: 500.0000 mg | ORAL_TABLET | Freq: Every day | ORAL | Status: AC

## 2011-10-20 MED ORDER — OXYCODONE HCL 5 MG PO TABS: 5.0000 mg | ORAL_TABLET | ORAL | Status: AC | PRN

## 2011-10-20 NOTE — Evaluation (Signed)
Physical Therapy Evaluation Patient Details Name: Kathleen Ramirez MRN: [id number] DOB: 01/16/1946 Today's Date: 10/20/2011 Time: 5784-6962 PT Time Calculation (min): 19 min  PT Assessment / Plan / Recommendation Clinical Impression  Pt appears to have no functional mobility deficits for ambulation, transfers or stairs at this time. Pt instructed to maintain activity and continue ambulation.      PT Assessment  Patent does not need any further PT services    Follow Up Recommendations  No PT follow up    Barriers to Discharge        Equipment Recommendations  None recommended by PT    Recommendations for Other Services OT consult   Frequency      Precautions / Restrictions Restrictions Weight Bearing Restrictions: Yes RUE Weight Bearing: Non weight bearing   Pertinent Vitals/Pain 8/10 RUE; no pain in legs reported      Mobility  Bed Mobility Bed Mobility: Supine to Sit;Sitting - Scoot to Edge of Bed Supine to Sit: 7: Independent Sitting - Scoot to [place] of Bed: 7: Independent Transfers Transfers: Sit to Stand;Stand to Sit Sit to Stand: 7: Independent Stand to Sit: 7: Independent Ambulation/Gait Ambulation/Gait Assistance: 7: Independent Ambulation Distance (Feet): 300 Feet Assistive device: None Ambulation/Gait Assistance Details: Pt self supports RUE secondary to heaviness of cast Gait Pattern: Within Functional Limits;Step-through pattern Stairs: Yes Stairs Assistance: 7: Independent Stair Management Technique: One rail Left Number of Stairs: 6       PT Goals Acute Rehab PT Goals PT Goal Formulation: With patient Time For Goal Achievement: 10/23/11 Potential to Achieve Goals: Good Pt will Go Up / Down Stairs: 3-5 stairs PT Goal: Up/Down Stairs - Progress: Met  Visit Information  Last PT Received On: 10/20/11 Assistance Needed: +1    Subjective Data  Subjective: I'm not having any pain in my legs today Patient Stated Goal: to go home   Prior  Functioning  Home Living Lives With: Alone Available Help at Discharge: Family;Friend(s) Type of Home: House Home Access: Stairs to enter Secretary/administrator of Steps: 3 Entrance Stairs-Rails: Right;Left Home Layout: One level Bathroom Shower/Tub: Walk-in Contractor: Standard Home Adaptive Equipment: None Prior Function Level of Independence: Independent Able to Take Stairs?: Yes Driving: Yes Vocation: Full time employment Comments: Psychologist, educational Communication: No difficulties Dominant Hand: Right    Cognition  Overall Cognitive Status: Appears within functional limits for tasks assessed/performed Arousal/Alertness: Awake/alert Orientation Level: Appears intact for tasks assessed;Oriented X4 / Intact Behavior During Session: [facility] for tasks performed    Extremity/Trunk Assessment Right Upper Extremity Assessment RUE ROM/Strength/Tone: Deficits;Unable to fully assess;Due to pain;Due to precautions Left Upper Extremity Assessment LUE ROM/Strength/Tone: Within functional levels LUE Sensation: WFL - Light Touch;WFL - Proprioception Right Lower Extremity Assessment RLE ROM/Strength/Tone: Within functional levels;WFL for tasks assessed RLE Sensation: WFL - Light Touch;WFL - Proprioception RLE Coordination: WFL - gross/fine motor Left Lower Extremity Assessment LLE ROM/Strength/Tone: Within functional levels;WFL for tasks assessed LLE Sensation: WFL - Light Touch;WFL - Proprioception LLE Coordination: WFL - gross/fine motor Trunk Assessment Trunk Assessment: Normal   Balance    End of Session PT - End of Session Equipment Utilized During Treatment: Gait belt Activity Tolerance: Patient tolerated treatment well Patient left: in chair;with call bell/phone within reach  GP     Fabio Asa 10/20/2011, 9:13 AM Charlotte Crumb, PT DPT  [phone]
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
## 2011-10-20 NOTE — ED Provider Notes (Signed)
Medical screening examination/treatment/procedure(s) were performed by non-physician practitioner and as supervising physician I was immediately available for consultation/collaboration.  Danamarie Minami, MD 10/20/11 0754 

## 2011-10-20 NOTE — Progress Notes (Signed)
Orthopedic Tech Progress Note Patient Details:  Kathleen Ramirez 26-Aug-1945 [id number]  Ortho Devices Type of Ortho Device: Arm foam sling Ortho Device/Splint Location: right arm sling Ortho Device/Splint Interventions: Application   Cammer, Mickie Bail 10/20/2011, 11:19 AM

## 2011-10-23 ENCOUNTER — Encounter (HOSPITAL_COMMUNITY): Payer: Self-pay | Admitting: Orthopedic Surgery

## 2011-11-19 ENCOUNTER — Ambulatory Visit: Payer: No Typology Code available for payment source | Attending: Orthopedic Surgery | Admitting: Occupational Therapy

## 2011-11-19 DIAGNOSIS — M2550 Pain in unspecified joint: Secondary | ICD-10-CM | POA: Diagnosis not present

## 2011-11-19 DIAGNOSIS — Z5189 Encounter for other specified aftercare: Secondary | ICD-10-CM | POA: Insufficient documentation

## 2011-11-19 DIAGNOSIS — R279 Unspecified lack of coordination: Secondary | ICD-10-CM | POA: Diagnosis not present

## 2011-11-19 DIAGNOSIS — M2560 Stiffness of unspecified joint, not elsewhere classified: Secondary | ICD-10-CM | POA: Insufficient documentation

## 2011-11-20 ENCOUNTER — Ambulatory Visit: Payer: No Typology Code available for payment source | Admitting: Occupational Therapy

## 2011-11-20 DIAGNOSIS — Z5189 Encounter for other specified aftercare: Secondary | ICD-10-CM | POA: Diagnosis not present

## 2011-12-01 ENCOUNTER — Ambulatory Visit: Payer: No Typology Code available for payment source | Admitting: Occupational Therapy

## 2011-12-01 DIAGNOSIS — Z5189 Encounter for other specified aftercare: Secondary | ICD-10-CM | POA: Diagnosis not present

## 2011-12-02 ENCOUNTER — Ambulatory Visit: Payer: No Typology Code available for payment source | Admitting: Occupational Therapy

## 2011-12-02 DIAGNOSIS — Z5189 Encounter for other specified aftercare: Secondary | ICD-10-CM | POA: Diagnosis not present

## 2011-12-04 ENCOUNTER — Encounter: Payer: Medicare Other | Admitting: Occupational Therapy

## 2011-12-09 ENCOUNTER — Ambulatory Visit: Payer: No Typology Code available for payment source | Admitting: Occupational Therapy

## 2011-12-09 DIAGNOSIS — Z5189 Encounter for other specified aftercare: Secondary | ICD-10-CM | POA: Diagnosis not present

## 2011-12-11 ENCOUNTER — Ambulatory Visit: Payer: No Typology Code available for payment source | Admitting: Occupational Therapy

## 2011-12-11 DIAGNOSIS — Z5189 Encounter for other specified aftercare: Secondary | ICD-10-CM | POA: Diagnosis not present

## 2011-12-16 ENCOUNTER — Ambulatory Visit: Payer: No Typology Code available for payment source | Admitting: *Deleted

## 2011-12-16 DIAGNOSIS — Z5189 Encounter for other specified aftercare: Secondary | ICD-10-CM | POA: Diagnosis not present

## 2011-12-18 ENCOUNTER — Ambulatory Visit: Payer: No Typology Code available for payment source | Admitting: Occupational Therapy

## 2011-12-18 DIAGNOSIS — Z5189 Encounter for other specified aftercare: Secondary | ICD-10-CM | POA: Diagnosis not present

## 2011-12-23 ENCOUNTER — Ambulatory Visit: Payer: Medicare Other | Attending: Orthopedic Surgery | Admitting: Occupational Therapy

## 2011-12-23 DIAGNOSIS — M2550 Pain in unspecified joint: Secondary | ICD-10-CM | POA: Insufficient documentation

## 2011-12-23 DIAGNOSIS — M2560 Stiffness of unspecified joint, not elsewhere classified: Secondary | ICD-10-CM | POA: Insufficient documentation

## 2011-12-23 DIAGNOSIS — Z5189 Encounter for other specified aftercare: Secondary | ICD-10-CM | POA: Insufficient documentation

## 2011-12-23 DIAGNOSIS — R279 Unspecified lack of coordination: Secondary | ICD-10-CM | POA: Insufficient documentation

## 2011-12-25 ENCOUNTER — Ambulatory Visit: Payer: Medicare Other | Admitting: Occupational Therapy

## 2011-12-29 ENCOUNTER — Ambulatory Visit: Payer: Medicare Other | Admitting: Occupational Therapy

## 2011-12-31 ENCOUNTER — Ambulatory Visit: Payer: Medicare Other | Admitting: Occupational Therapy

## 2012-01-05 ENCOUNTER — Ambulatory Visit: Payer: Medicare Other | Admitting: Occupational Therapy

## 2012-01-07 ENCOUNTER — Ambulatory Visit: Payer: Medicare Other | Admitting: Occupational Therapy

## 2013-06-04 ENCOUNTER — Encounter (HOSPITAL_COMMUNITY): Payer: Self-pay | Admitting: Emergency Medicine

## 2013-06-04 ENCOUNTER — Emergency Department (INDEPENDENT_AMBULATORY_CARE_PROVIDER_SITE_OTHER)
Admission: EM | Admit: 2013-06-04 | Discharge: 2013-06-04 | Disposition: A | Discharge status: Home / Self Care | Payer: Medicare Other | Source: Home / Self Care

## 2013-06-04 DIAGNOSIS — H9202 Otalgia, left ear: Secondary | ICD-10-CM

## 2013-06-04 DIAGNOSIS — H9209 Otalgia, unspecified ear: Secondary | ICD-10-CM | POA: Diagnosis not present

## 2013-06-04 DIAGNOSIS — H6590 Unspecified nonsuppurative otitis media, unspecified ear: Secondary | ICD-10-CM | POA: Diagnosis not present

## 2013-06-04 MED ORDER — HYDROCODONE-ACETAMINOPHEN 5-325 MG PO TABS: 1.0000 | ORAL_TABLET | ORAL | Status: DC | PRN

## 2013-06-04 MED ORDER — ANTIPYRINE-BENZOCAINE 5.4-1.4 % OT SOLN: 3.0000 [drp] | OTIC | Status: DC | PRN

## 2013-06-04 MED ORDER — CEFUROXIME AXETIL 250 MG PO TABS: 250.0000 mg | ORAL_TABLET | Freq: Two times a day (BID) | ORAL | Status: DC

## 2013-06-04 NOTE — ED Provider Notes (Signed)
CSN: [id number]     Arrival date & time 06/04/13  1623 History   First MD Initiated Contact with Patient 06/04/13 1754     Chief Complaint  Patient presents with  . Otalgia   (Consider location/radiation/quality/duration/timing/severity/associated sxs/prior Treatment) HPI Comments: 68 year old female complaining of sharp stabbing pain to the left ear since this morning. No history of trauma. No history of inserting objects into the left ear.   Past Medical History  Diagnosis Date  . Cancer   . Cervical cancer   . Tumors     5tumors removed  . Bilateral ovarian tumors   . Dysplastic colon polyp   . Yeast vaginitis   . UTI (lower urinary tract infection)    Past Surgical History  Procedure Laterality Date  . Abdominal hysterectomy    . Tubal ligation    . Tonsillectomy    . Bladder surgery    . Breast lumpectomy    . Tumor excision      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  . Oophrectomy    . Bladder neck suspension    . Appendectomy    . Orif ulnar fracture  10/19/2011    Procedure: OPEN REDUCTION INTERNAL FIXATION (ORIF) ULNAR FRACTURE;  Surgeon: Linna Hoff, MD;  Location: [facility];  Service: Orthopedics;  Laterality: Right;   No family history on file. History  Substance Use Topics  . Smoking status: Current Every Day Smoker    Types: Cigarettes  . Smokeless tobacco: Not on file  . Alcohol Use: Yes   OB History   Grav Para Term Preterm Abortions TAB SAB Ect Mult Living                 Review of Systems  Constitutional: Negative.   HENT: Positive for ear pain. Negative for congestion, postnasal drip, rhinorrhea and sinus pressure.   Respiratory: Negative.   Cardiovascular: Negative.     Allergies  Codeine  Home Medications   Prior to Admission medications   Not on File   BP 147/86  Pulse 72  Temp(Src) 98.5 F (36.9 C) (Oral)  Resp 16  SpO2 97% Physical Exam  Nursing note and vitals reviewed. Constitutional: She is oriented to person,  place, and time. She appears well-developed and well-nourished. No distress.  HENT:  Right Ear: External ear normal.  Left TM with mild bulging and erythema to the dependent portion of the TM.  Eyes: Conjunctivae and EOM are normal.  Neck: Normal range of motion. Neck supple.  Pulmonary/Chest: Effort normal. No respiratory distress.  Neurological: She is alert and oriented to person, place, and time. She exhibits normal muscle tone.  Skin: Skin is warm and dry.    ED Course  Procedures (including critical care time) Labs Review Labs Reviewed - No data to display  Imaging Review No results found.   MDM   1. Otalgia of left ear   2. Serous otitis media       ceftin Auralgan otic norco 5 mg #15    Janne Napoleon, NP 06/04/13 1805

## 2013-06-04 NOTE — Discharge Instructions (Signed)
Ear Drops, Adult You have been diagnosed with a condition requiring you to put drops of medication into your outer ear. HOME CARE INSTRUCTIONS   Put drops in the affected ear as instructed. After putting the drops in, you will need to lay down with the affected ear facing up for ten minutes so the drops will remain in the ear canal and run down and fill the canal. Continue using eardrops for as long as directed by your health care provider.  Prior to getting up, put a cotton ball gently in your ear canal. Leave enough of the ball out so it can be easily removed. Do not attempt to push this down into the canal with a cotton-tipped swab or other instrument.  Do not irrigate or wash out your ears if you have had a perforated eardrum or mastoid surgery, or unless instructed to do so by your health care provider.  Keep appointments with your health care provider as instructed.  Finish all medications, or use for the length of time as instructed. Continue the drops even if your problem seems to be doing well after a couple days, or continue as instructed. SEEK MEDICAL CARE IF:  You become worse or develop increasing pain.  You notice any unusual drainage from your ear (particularly if the drainage stinks).  You develop hearing difficulties.  You experience a serious form of dizziness in which you feel as if the room is spinning, and you feel nauseated (vertigo).  The outside of your ear becomes red or swollen or both. This may be a sign of an allergic reaction. MAKE SURE YOU:   Understand these instructions.  Will watch your condition.  Will get help right away if you are not doing well or get worse. Document Released: 01/28/2001 Document Revised: 11/24/2012 Document Reviewed: 08/31/2012 [facility] Patient Information 2014 [place], [place].  Otalgia The most common reason for this in children is an infection of the middle ear. Pain from the middle ear is usually caused by a build-up of  fluid and pressure behind the eardrum. Pain from an earache can be sharp, dull, or burning. The pain may be temporary or constant. The middle ear is connected to the nasal passages by a short narrow tube called the Eustachian tube. The Eustachian tube allows fluid to drain out of the middle ear, and helps keep the pressure in your ear equalized. CAUSES  A cold or allergy can block the Eustachian tube with inflammation and the build-up of secretions. This is especially likely in small children, because their Eustachian tube is shorter and more horizontal. When the Eustachian tube closes, the normal flow of fluid from the middle ear is stopped. Fluid can accumulate and cause stuffiness, pain, hearing loss, and an ear infection if germs start growing in this area. SYMPTOMS  The symptoms of an ear infection may include fever, ear pain, fussiness, increased crying, and irritability. Many children will have temporary and minor hearing loss during and right after an ear infection. Permanent hearing loss is rare, but the risk increases the more infections a child has. Other causes of ear pain include retained water in the outer ear canal from swimming and bathing. Ear pain in adults is less likely to be from an ear infection. Ear pain may be referred from other locations. Referred pain may be from the joint between your jaw and the skull. It may also come from a tooth problem or problems in the neck. Other causes of ear pain include:  A  foreign body in the ear.  Outer ear infection.  Sinus infections.  Impacted ear wax.  Ear injury.  Arthritis of the jaw or TMJ problems.  Middle ear infection.  Tooth infections.  Sore throat with pain to the ears. DIAGNOSIS  Your caregiver can usually make the diagnosis by examining you. Sometimes other special studies, including x-rays and lab work may be necessary. TREATMENT   If antibiotics were prescribed, use them as directed and finish them even if you or  your child's symptoms seem to be improved.  Sometimes PE tubes are needed in children. These are little plastic tubes which are put into the eardrum during a simple surgical procedure. They allow fluid to drain easier and allow the pressure in the middle ear to equalize. This helps relieve the ear pain caused by pressure changes. HOME CARE INSTRUCTIONS   Only take over-the-counter or prescription medicines for pain, discomfort, or fever as directed by your caregiver. DO NOT GIVE CHILDREN ASPIRIN because of the association of Reye's Syndrome in children taking aspirin.  Use a cold pack applied to the outer ear for 15-20 minutes, 03-04 times per day or as needed may reduce pain. Do not apply ice directly to the skin. You may cause frost bite.  Over-the-counter ear drops used as directed may be effective. Your caregiver may sometimes prescribe ear drops.  Resting in an upright position may help reduce pressure in the middle ear and relieve pain.  Ear pain caused by rapidly descending from high altitudes can be relieved by swallowing or chewing gum. Allowing infants to suck on a bottle during airplane travel can help.  Do not smoke in the house or near children. If you are unable to quit smoking, smoke outside.  Control allergies. SEEK IMMEDIATE MEDICAL CARE IF:   You or your child are becoming sicker.  Pain or fever relief is not obtained with medicine.  You or your child's symptoms (pain, fever, or irritability) do not improve within 24 to 48 hours or as instructed.  Severe pain suddenly stops hurting. This may indicate a ruptured eardrum.  You or your children develop new problems such as severe headaches, stiff neck, difficulty swallowing, or swelling of the face or around the ear. Document Released: 09/21/2003 Document Revised: 04/28/2011 Document Reviewed: 01/26/2008 [facility] Patient Information 2014 [place].  Serous Otitis Media  Serous otitis media is fluid in the middle  ear space. This space contains the bones for hearing and air. Air in the middle ear space helps to transmit sound.  The air gets there through the eustachian tube. This tube goes from the back of the nose (nasopharynx) to the middle ear space. It keeps the pressure in the middle ear the same as the outside world. It also helps to drain fluid from the middle ear space. CAUSES  Serous otitis media occurs when the eustachian tube gets blocked. Blockage can come from:  Ear infections.  Colds and other upper respiratory infections.  Allergies.  Irritants such as cigarette smoke.  Sudden changes in air pressure (such as descending in an airplane).  Enlarged adenoids.  A mass in the nasopharynx. During colds and upper respiratory infections, the middle ear space can become temporarily filled with fluid. This can happen after an ear infection also. Once the infection clears, the fluid will generally drain out of the ear through the eustachian tube. If it does not, then serous otitis media occurs. SIGNS AND SYMPTOMS   Hearing loss.  A feeling of fullness in  the ear, without pain.  Young children may not show any symptoms but may show slight behavioral changes, such as agitation, ear pulling, or crying. DIAGNOSIS  Serous otitis media is diagnosed by an ear exam. Tests may be done to check on the movement of the eardrum. Hearing exams may also be done. TREATMENT  The fluid most often goes away without treatment. If allergy is the cause, allergy treatment may be helpful. Fluid that persists for several months may require minor surgery. A small tube is placed in the eardrum to:  Drain the fluid.  Restore the air in the middle ear space. In certain situations, antibiotics are used to avoid surgery. Surgery may be done to remove enlarged adenoids (if this is the cause). HOME CARE INSTRUCTIONS   Keep children away from tobacco smoke.  Be sure to keep any follow-up appointments. SEEK MEDICAL  CARE IF:   Your hearing is not better in 3 months.  Your hearing is worse.  You have ear pain.  You have drainage from the ear.  You have dizziness.  You have serous otitis media only in one ear or have any bleeding from your nose (epistaxis).  You notice a lump on your neck. MAKE SURE YOU:  Understand these instructions.   Will watch your condition.   Will get help right away if you are not doing well or get worse.  Document Released: 04/26/2003 Document Revised: 10/06/2012 Document Reviewed: 08/31/2012 [facility] Patient Information 2014 [place], [place].

## 2013-06-04 NOTE — ED Notes (Signed)
Pt c/o left ear pain onset this am; pain is 10/10 Sx also include HA Denies f/v/n/d, cold sx Alert w/no signs of acute distress.

## 2013-06-06 ENCOUNTER — Encounter (HOSPITAL_COMMUNITY): Payer: Self-pay | Admitting: Emergency Medicine

## 2013-06-06 ENCOUNTER — Emergency Department (INDEPENDENT_AMBULATORY_CARE_PROVIDER_SITE_OTHER)
Admission: EM | Admit: 2013-06-06 | Discharge: 2013-06-06 | Disposition: A | Discharge status: Home / Self Care | Payer: Medicare Other | Source: Home / Self Care | Attending: Family Medicine | Admitting: Family Medicine

## 2013-06-06 DIAGNOSIS — H6090 Unspecified otitis externa, unspecified ear: Secondary | ICD-10-CM

## 2013-06-06 DIAGNOSIS — H60399 Other infective otitis externa, unspecified ear: Secondary | ICD-10-CM

## 2013-06-06 MED ORDER — KETOROLAC TROMETHAMINE 30 MG/ML IJ SOLN
INTRAMUSCULAR | Status: AC
  Filled 2013-06-06: qty 1

## 2013-06-06 MED ORDER — OFLOXACIN 0.3 % OT SOLN: 10.0000 [drp] | Freq: Every day | OTIC | Status: DC

## 2013-06-06 MED ORDER — KETOROLAC TROMETHAMINE 30 MG/ML IJ SOLN
30.0000 mg | Freq: Once | INTRAMUSCULAR | Status: AC
  Administered 2013-06-06: 30 mg via INTRAMUSCULAR

## 2013-06-06 NOTE — ED Notes (Signed)
Seen  2  Days  Ago  For  An ear  Infection   Continues  To  Have  Pain  And  sufferage        From the  Affected  Ear       She  Reports  She got her meds  Filled   As  Rx     And  They  Are  Not  Helping

## 2013-06-06 NOTE — ED Provider Notes (Signed)
CSN: [id number]     Arrival date & time 06/06/13  0847 History   First MD Initiated Contact with Patient 06/06/13 [phone]     Chief Complaint  Patient presents with  . Otalgia   (Consider location/radiation/quality/duration/timing/severity/associated sxs/prior Treatment) Patient is a 68 y.o. female presenting with ear pain. The history is provided by the patient.  Otalgia Location:  Left Quality:  Sharp Severity:  Severe Onset quality:  Gradual Duration:  3 days Timing:  Constant Progression:  Worsening Chronicity:  New Context: not direct blow, not elevation change, not foreign body in ear, not loud noise and no water in ear   Ineffective treatments: Was seen for same at [facility] on 06-04-2013 and diagnosed with AOM and placed on Ceftin, Auralgan and Vicodin.   Past Medical History  Diagnosis Date  . Cancer   . Cervical cancer   . Tumors     5tumors removed  . Bilateral ovarian tumors   . Dysplastic colon polyp   . Yeast vaginitis   . UTI (lower urinary tract infection)    Past Surgical History  Procedure Laterality Date  . Abdominal hysterectomy    . Tubal ligation    . Tonsillectomy    . Bladder surgery    . Breast lumpectomy    . Tumor excision      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  . Oophrectomy    . Bladder neck suspension    . Appendectomy    . Orif ulnar fracture  10/19/2011    Procedure: OPEN REDUCTION INTERNAL FIXATION (ORIF) ULNAR FRACTURE;  Surgeon: Linna Hoff, MD;  Location: [place];  Service: Orthopedics;  Laterality: Right;   History reviewed. No pertinent family history. History  Substance Use Topics  . Smoking status: Current Every Day Smoker    Types: Cigarettes  . Smokeless tobacco: Not on file  . Alcohol Use: Yes   OB History   Grav Para Term Preterm Abortions TAB SAB Ect Mult Living                 Review of Systems  Constitutional: Negative.   HENT: Positive for ear pain.   Eyes: Negative.   Respiratory: Negative.    Cardiovascular: Negative.   Gastrointestinal: Negative.   Musculoskeletal: Negative.   Skin: Negative.   All other systems reviewed and are negative.   Allergies  Codeine  Home Medications   Prior to Admission medications   Medication Sig Start Date End Date Taking? Authorizing Provider  antipyrine-benzocaine Toniann Fail) otic solution Place 3-4 drops into the left ear every 2 (two) hours as needed for ear pain. 06/04/13   Janne Napoleon, NP  cefUROXime (CEFTIN) 250 MG tablet Take 1 tablet (250 mg total) by mouth 2 (two) times daily with a meal. 06/04/13   Janne Napoleon, NP  HYDROcodone-acetaminophen (NORCO/VICODIN) 5-325 MG per tablet Take 1 tablet by mouth every 4 (four) hours as needed. 06/04/13   Janne Napoleon, NP   BP 156/77  Pulse 72  Temp(Src) 97.6 F (36.4 C) (Oral)  Resp 18  SpO2 97% Physical Exam  Nursing note and vitals reviewed. Constitutional: She is oriented to person, place, and time. She appears well-developed and well-nourished. No distress.  HENT:  Head: Normocephalic and atraumatic.  Right Ear: Hearing, tympanic membrane, external ear and ear canal normal.  Left Ear: Hearing and tympanic membrane normal.  Nose: Nose normal.  Mouth/Throat: Uvula is midline, oropharynx is clear and moist and mucous membranes are normal.  Painful left ear  canal when examined with ear speculum. White exudative material lining floor of ear canal   Eyes: Conjunctivae are normal. No scleral icterus.  Neck: Normal range of motion. Neck supple.  Cardiovascular: Normal rate.   Pulmonary/Chest: Effort normal.  Musculoskeletal: Normal range of motion.  Lymphadenopathy:    She has no cervical adenopathy.  Neurological: She is alert and oriented to person, place, and time.  Skin: Skin is warm and dry.  Psychiatric: She has a normal mood and affect. Her behavior is normal.    ED Course  Procedures (including critical care time) Labs Review Labs Reviewed - No data to display  Results for  orders placed during the hospital encounter of 10/18/11  SURGICAL PCR SCREEN      Result Value Ref Range   MRSA, PCR NEGATIVE  NEGATIVE   Staphylococcus aureus NEGATIVE  NEGATIVE  URINALYSIS, ROUTINE W REFLEX MICROSCOPIC      Result Value Ref Range   Color, Urine YELLOW  YELLOW   APPearance CLOUDY (*) CLEAR   Specific Gravity, Urine 1.024  1.005 - 1.030   pH 5.5  5.0 - 8.0   Glucose, UA 500 (*) NEGATIVE mg/dL   Hgb urine dipstick SMALL (*) NEGATIVE   Bilirubin Urine NEGATIVE  NEGATIVE   Ketones, ur NEGATIVE  NEGATIVE mg/dL   Protein, ur NEGATIVE  NEGATIVE mg/dL   Urobilinogen, UA 1.0  0.0 - 1.0 mg/dL   Nitrite NEGATIVE  NEGATIVE   Leukocytes, UA TRACE (*) NEGATIVE  URINE MICROSCOPIC-ADD ON      Result Value Ref Range   Squamous Epithelial / LPF MANY (*) RARE   WBC, UA 0-2  <3 WBC/hpf   RBC / HPF 0-2  <3 RBC/hpf   Bacteria, UA MANY (*) RARE  CBC WITH DIFFERENTIAL      Result Value Ref Range   WBC 12.6 (*) 4.0 - 10.5 K/uL   RBC 4.56  3.87 - 5.11 MIL/uL   Hemoglobin 13.8  12.0 - 15.0 g/dL   HCT 40.4  36.0 - 46.0 %   MCV 88.6  78.0 - 100.0 fL   MCH 30.3  26.0 - 34.0 pg   MCHC 34.2  30.0 - 36.0 g/dL   RDW 13.2  11.5 - 15.5 %   Platelets 198  150 - 400 K/uL   Neutrophils Relative % 60  43 - 77 %   Neutro Abs 7.6  1.7 - 7.7 K/uL   Lymphocytes Relative 31  12 - 46 %   Lymphs Abs 3.9  0.7 - 4.0 K/uL   Monocytes Relative 7  3 - 12 %   Monocytes Absolute 0.9  0.1 - 1.0 K/uL   Eosinophils Relative 2  0 - 5 %   Eosinophils Absolute 0.2  0.0 - 0.7 K/uL   Basophils Relative 0  0 - 1 %   Basophils Absolute 0.1  0.0 - 0.1 K/uL  BASIC METABOLIC PANEL      Result Value Ref Range   Sodium 138  135 - 145 mEq/L   Potassium 3.7  3.5 - 5.1 mEq/L   Chloride 105  96 - 112 mEq/L   CO2 24  19 - 32 mEq/L   Glucose, Bld 86  70 - 99 mg/dL   BUN 14  6 - 23 mg/dL   Creatinine, Ser 0.68  0.50 - 1.10 mg/dL   Calcium 8.4  8.4 - 10.5 mg/dL   GFR calc non Af Amer 90 (*) >90 mL/min   GFR calc Af  Amer >90  >90 mL/min  PROTIME-INR      Result Value Ref Range   Prothrombin Time 13.1  11.6 - 15.2 seconds   INR 0.97  0.00 - 1.49   Imaging Review No results found.   MDM   1. Otitis externa    Exam suggests otitis externa. Will advise patient discontinue Ceftin and may continue Vicodin and begin using ofloxacin otic as prescribed.    [place], [place] 06/06/13 [phone]

## 2013-06-06 NOTE — Discharge Instructions (Signed)
Otitis Externa Otitis externa is a bacterial or fungal infection of the outer ear canal. This is the area from the eardrum to the outside of the ear. Otitis externa is sometimes called "swimmer's ear." CAUSES  Possible causes of infection include:  Swimming in dirty water.  Moisture remaining in the ear after swimming or bathing.  Mild injury (trauma) to the ear.  Objects stuck in the ear (foreign body).  Cuts or scrapes (abrasions) on the outside of the ear. SYMPTOMS  The first symptom of infection is often itching in the ear canal. Later signs and symptoms may include swelling and redness of the ear canal, ear pain, and yellowish-white fluid (pus) coming from the ear. The ear pain may be worse when pulling on the earlobe. DIAGNOSIS  Your caregiver will perform a physical exam. A sample of fluid may be taken from the ear and examined for bacteria or fungi. TREATMENT  Antibiotic ear drops are often given for 10 to 14 days. Treatment may also include pain medicine or corticosteroids to reduce itching and swelling. PREVENTION   Keep your ear dry. Use the corner of a towel to absorb water out of the ear canal after swimming or bathing.  Avoid scratching or putting objects inside your ear. This can damage the ear canal or remove the protective wax that lines the canal. This makes it easier for bacteria and fungi to grow.  Avoid swimming in lakes, polluted water, or poorly chlorinated pools.  You may use ear drops made of rubbing alcohol and vinegar after swimming. Combine equal parts of white vinegar and alcohol in a bottle. Put 3 or 4 drops into each ear after swimming. HOME CARE INSTRUCTIONS   Apply antibiotic ear drops to the ear canal as prescribed by your caregiver.  Only take over-the-counter or prescription medicines for pain, discomfort, or fever as directed by your caregiver.  If you have diabetes, follow any additional treatment instructions from your caregiver.  Keep all  follow-up appointments as directed by your caregiver. SEEK MEDICAL CARE IF:   You have a fever.  Your ear is still red, swollen, painful, or draining pus after 3 days.  Your redness, swelling, or pain gets worse.  You have a severe headache.  You have redness, swelling, pain, or tenderness in the area behind your ear. MAKE SURE YOU:   Understand these instructions.  Will watch your condition.  Will get help right away if you are not doing well or get worse. Document Released: 02/03/2005 Document Revised: 04/28/2011 Document Reviewed: 02/20/2011 Exit[employer] Patient Information 2014 [employer].  

## 2013-06-06 NOTE — ED Provider Notes (Signed)
Medical screening examination/treatment/procedure(s) were performed by non-physician practitioner and as supervising physician I was immediately available for consultation/collaboration.  Philipp Deputy, M.D.  Harden Mo, MD 06/06/13 1052

## 2013-06-08 NOTE — ED Provider Notes (Signed)
Medical screening examination/treatment/procedure(s) were performed by a resident physician or non-physician practitioner and as the supervising physician I was immediately available for consultation/collaboration.  Lynne Leader, MD    Gregor Hams, MD 06/08/13 1324

## 2013-06-24 ENCOUNTER — Encounter: Payer: Self-pay | Admitting: *Deleted

## 2013-06-28 ENCOUNTER — Ambulatory Visit (INDEPENDENT_AMBULATORY_CARE_PROVIDER_SITE_OTHER): Payer: Medicare Other | Admitting: Family Medicine

## 2013-06-28 ENCOUNTER — Encounter: Payer: Self-pay | Admitting: Family Medicine

## 2013-06-28 VITALS — BP 136/88 | HR 78 | Temp 97.1°F | Resp 18 | Ht 65.0 in | Wt 168.0 lb

## 2013-06-28 DIAGNOSIS — A599 Trichomoniasis, unspecified: Secondary | ICD-10-CM

## 2013-06-28 DIAGNOSIS — S0300XA Dislocation of jaw, unspecified side, initial encounter: Secondary | ICD-10-CM

## 2013-06-28 DIAGNOSIS — R319 Hematuria, unspecified: Secondary | ICD-10-CM

## 2013-06-28 LAB — URINALYSIS, MICROSCOPIC ONLY
CRYSTALS: NONE SEEN
Casts: NONE SEEN

## 2013-06-28 LAB — URINALYSIS, ROUTINE W REFLEX MICROSCOPIC
Bilirubin Urine: NEGATIVE
GLUCOSE, UA: 500 mg/dL — AB
Ketones, ur: NEGATIVE mg/dL
Nitrite: NEGATIVE
PROTEIN: NEGATIVE mg/dL
SPECIFIC GRAVITY, URINE: 1.01 (ref 1.005–1.030)
Urobilinogen, UA: 0.2 mg/dL (ref 0.0–1.0)
pH: 6 (ref 5.0–8.0)

## 2013-06-28 MED ORDER — MELOXICAM 15 MG PO TABS: 15.0000 mg | ORAL_TABLET | Freq: Every day | ORAL | Status: DC

## 2013-06-28 MED ORDER — METRONIDAZOLE 500 MG PO TABS: 2000.0000 mg | ORAL_TABLET | Freq: Once | ORAL | Status: DC

## 2013-06-28 MED ORDER — DIAZEPAM 10 MG PO TABS: 10.0000 mg | ORAL_TABLET | Freq: Every evening | ORAL | Status: DC | PRN

## 2013-06-28 NOTE — Progress Notes (Signed)
Subjective:    Patient ID: Kathleen Ramirez, female    DOB: [birth year], 68 y.o.   MRN: [id number]  HPI Patient is here today to establish care. She reports 2 weeks of dysuria, occasional hematuria, and vaginal itching. Urinalysis today in the office is significant for trichomoniasis. Patient also complains of episodic left ear pain. The pain is sharp and severe in nature. It is located just anterior to the left ear and radiates into her left jaw. She's been seen twice in urgent care in April. Was initially diagnosed with serous otitis media and placed on Ceftin. She was then diagnosed with otitis externa and was treated with ear gtts.  The pain is much better but she continues to have daily constant pressure-like pain in her left ear. She reports that she grinds her teeth at night. She is unable to sleep at night and she is not resting or relaxing at night. She's been hypertensive she grinds her teeth. She is tender to palpation in the right TMJ.   Past Medical History  Diagnosis Date  . Cancer   . Cervical cancer   . Tumors     5tumors removed  . Bilateral ovarian tumors   . Dysplastic colon polyp   . Yeast vaginitis   . UTI (lower urinary tract infection)    No current outpatient prescriptions on file prior to visit.   No current facility-administered medications on file prior to visit.   Allergies  Allergen Reactions  . Codeine Nausea And Vomiting     makes heart race   History   Social History  . Marital Status: Single    Spouse Name: N/A    Number of Children: N/A  . Years of Education: N/A   Occupational History  . Not on file.   Social History Main Topics  . Smoking status: Current Every Day Smoker -- 0.50 packs/day    Types: Cigarettes  . Smokeless tobacco: Never Used  . Alcohol Use: Yes     Comment: occasionally  . Drug Use: No  . Sexual Activity: Yes     Comment: single female partner   Other Topics Concern  . Not on file   Social History Narrative  . No  narrative on file      Review of Systems  All other systems reviewed and are negative.      Objective:   Physical Exam  Vitals reviewed. Constitutional: She appears well-developed and well-nourished. No distress.  HENT:  Right Ear: Tympanic membrane, external ear and ear canal normal.  Left Ear: Tympanic membrane, external ear and ear canal normal.  Nose: Nose normal.  Mouth/Throat: Oropharynx is clear and moist. No oropharyngeal exudate.  Neck: Neck supple.  Cardiovascular: Normal rate, regular rhythm and normal heart sounds.   Pulmonary/Chest: Effort normal and breath sounds normal. No respiratory distress. She has no wheezes. She has no rales.  Abdominal: Soft. Bowel sounds are normal.  Lymphadenopathy:    She has no cervical adenopathy.  Skin: She is not diaphoretic.          Assessment & Plan:  1. Blood in urine - Urinalysis, Routine w reflex microscopic - Urine culture  2. Trichimoniasis Begin Flagyl 2000 mg by mouth times one. I also recommended that she treat her sexual partner. - metroNIDAZOLE (FLAGYL) 500 MG tablet; Take 4 tablets (2,000 mg total) by mouth once.  Dispense: 4 tablet; Refill: 1 - Urine culture  3. TMJ (dislocation of temporomandibular joint) There is no evidence of  an ear infection or any ear pathology today on examination. I am concerned she may have TMJ versus trigeminal neuralgia. I recommended Valium 10 mg by mouth each bedtime for 2 weeks. I recommended meloxicam 15 mg by mouth daily for 2 weeks. I recommended she temporarily discontinue ibuprofen. I recommended that she take her false teeth out at night so that she does not grind her teeth. Recheck in 2 weeks and see if the patient's left ear pain has improved. - meloxicam (MOBIC) 15 MG tablet; Take 1 tablet (15 mg total) by mouth daily.  Dispense: 30 tablet; Refill: 0 - diazepam (VALIUM) 10 MG tablet; Take 1 tablet (10 mg total) by mouth at bedtime as needed for anxiety.  Dispense: 30  tablet; Refill: 0

## 2013-06-30 ENCOUNTER — Telehealth: Payer: Self-pay | Admitting: Family Medicine

## 2013-06-30 LAB — URINE CULTURE

## 2013-06-30 NOTE — Telephone Encounter (Signed)
PA submitted through [email or web address]  

## 2013-07-06 NOTE — Telephone Encounter (Signed)
Approved and faxed to [place]

## 2013-07-07 ENCOUNTER — Other Ambulatory Visit: Payer: Self-pay | Admitting: Family Medicine

## 2013-07-26 ENCOUNTER — Other Ambulatory Visit: Payer: Self-pay | Admitting: Family Medicine

## 2013-07-27 NOTE — Telephone Encounter (Signed)
Call placed to patient.   States that she is still having pain in jaw, but she is going out of town at this time.   States that she will continue to use the IBU until she returns to [facility] and will then call to schedule an appointment with MD.  Pharmacy made aware refill denied.

## 2013-07-27 NOTE — Telephone Encounter (Signed)
Ok to refill??  Last office visit/ refill 06/28/2013.

## 2013-07-27 NOTE — Telephone Encounter (Signed)
This was prescribed 5/12 for tmj and she was supposed to follow up in 2 weeks if no better.  NTBS if no better.

## 2013-08-05 ENCOUNTER — Emergency Department (INDEPENDENT_AMBULATORY_CARE_PROVIDER_SITE_OTHER)
Admission: EM | Admit: 2013-08-05 | Discharge: 2013-08-05 | Disposition: A | Discharge status: Home / Self Care | Payer: Medicare Other | Source: Home / Self Care | Attending: Family Medicine | Admitting: Family Medicine

## 2013-08-05 ENCOUNTER — Encounter (HOSPITAL_COMMUNITY): Payer: Self-pay | Admitting: Emergency Medicine

## 2013-08-05 ENCOUNTER — Emergency Department (INDEPENDENT_AMBULATORY_CARE_PROVIDER_SITE_OTHER): Payer: Medicare Other

## 2013-08-05 DIAGNOSIS — J410 Simple chronic bronchitis: Secondary | ICD-10-CM

## 2013-08-05 DIAGNOSIS — J40 Bronchitis, not specified as acute or chronic: Secondary | ICD-10-CM

## 2013-08-05 DIAGNOSIS — Z720 Tobacco use: Secondary | ICD-10-CM

## 2013-08-05 MED ORDER — MINOCYCLINE HCL 100 MG PO CAPS: 100.0000 mg | ORAL_CAPSULE | Freq: Two times a day (BID) | ORAL | Status: DC

## 2013-08-05 NOTE — Discharge Instructions (Signed)
Take all of medicine, drink lots of fluids, no more smoking, see your doctor if further problems  °

## 2013-08-05 NOTE — ED Provider Notes (Signed)
CSN: [id number]     Arrival date & time 08/05/13  1314 History   First MD Initiated Contact with Patient 08/05/13 1402     Chief Complaint  Patient presents with  . URI   (Consider location/radiation/quality/duration/timing/severity/associated sxs/prior Treatment) Patient is a 68 y.o. female presenting with URI. The history is provided by the patient.  URI Presenting symptoms: congestion, cough and rhinorrhea   Presenting symptoms: no fever   Severity:  Moderate Onset quality:  Gradual Duration:  1 week Progression:  Unchanged Chronicity:  Chronic Relieved by:  None tried Ineffective treatments:  None tried Risk factors: being elderly, chronic respiratory disease and recent travel   Risk factors comment:  Smoker   Past Medical History  Diagnosis Date  . Cancer   . Cervical cancer   . Tumors     5tumors removed  . Bilateral ovarian tumors   . Dysplastic colon polyp   . Yeast vaginitis   . UTI (lower urinary tract infection)    Past Surgical History  Procedure Laterality Date  . Abdominal hysterectomy    . Tubal ligation    . Tonsillectomy    . Bladder surgery    . Breast lumpectomy    . Tumor excision      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  . Oophrectomy    . Bladder neck suspension    . Appendectomy    . Orif ulnar fracture  10/19/2011    Procedure: OPEN REDUCTION INTERNAL FIXATION (ORIF) ULNAR FRACTURE;  Surgeon: Linna Hoff, MD;  Location: [place];  Service: Orthopedics;  Laterality: Right;   No family history on file. History  Substance Use Topics  . Smoking status: Current Every Day Smoker -- 0.50 packs/day    Types: Cigarettes  . Smokeless tobacco: Never Used  . Alcohol Use: Yes     Comment: occasionally   OB History   Grav Para Term Preterm Abortions TAB SAB Ect Mult Living                 Review of Systems  Constitutional: Negative.  Negative for fever.  HENT: Positive for congestion and rhinorrhea.   Respiratory: Positive for cough  and shortness of breath. Negative for chest tightness.   Cardiovascular: Negative.   Gastrointestinal: Negative.     Allergies  Codeine  Home Medications   Prior to Admission medications   Medication Sig Start Date End Date Taking? Authorizing Provider  diazepam (VALIUM) 10 MG tablet Take 1 tablet (10 mg total) by mouth at bedtime as needed for anxiety. 06/28/13   Susy Frizzle, MD  ibuprofen (ADVIL,MOTRIN) 800 MG tablet Take 800 mg by mouth 2 (two) times daily.    Historical Provider, MD  meloxicam (MOBIC) 15 MG tablet Take 1 tablet (15 mg total) by mouth daily. 06/28/13   Susy Frizzle, MD  metroNIDAZOLE (FLAGYL) 500 MG tablet Take 4 tablets (2,000 mg total) by mouth once. 06/28/13   Susy Frizzle, MD  minocycline (MINOCIN,DYNACIN) 100 MG capsule Take 1 capsule (100 mg total) by mouth 2 (two) times daily. 08/05/13   Billy Fischer, MD   BP 121/70  Pulse 70  Temp(Src) 97.8 F (36.6 C) (Oral)  Resp 18  SpO2 95% Physical Exam  Nursing note and vitals reviewed. Constitutional: She has a sickly appearance.  HENT:  Right Ear: External ear normal.  Left Ear: External ear normal.  Mouth/Throat: Oropharynx is clear and moist.  Eyes: Conjunctivae are normal. Pupils are equal, round, and  reactive to light.  Neck: Normal range of motion. Neck supple.  Cardiovascular: Regular rhythm and normal heart sounds.   Pulmonary/Chest: Effort normal. She has decreased breath sounds. She has rhonchi.  Lymphadenopathy:    She has no cervical adenopathy.  Neurological: She is alert.  Skin: Skin is warm and dry.    ED Course  Procedures (including critical care time) Labs Review Labs Reviewed - No data to display  Imaging Review Dg Chest 2 View  08/05/2013   CLINICAL DATA:  Cough and congestion  EXAM: CHEST  2 VIEW  COMPARISON:  October 18, 2011  FINDINGS: There is no edema or consolidation. The heart size and pulmonary vascularity are normal. No adenopathy. There is degenerative change in  the thoracic spine. There is mild calcification in the thoracic aorta.  IMPRESSION: No edema or consolidation   Electronically Signed   By: Lowella Grip M.D.   On: 08/05/2013 14:43   X-rays reviewed and report per radiologist.   MDM   1. Bronchitis due to tobacco use        Billy Fischer, MD 08/05/13 1451

## 2013-08-05 NOTE — ED Notes (Signed)
headahce , cough, productive cough-green, then brown in color and very thick.  Minor throat soreness, but chest is burning, particularly with cough.  Reports chest burning feels like pain associated with "chronic bronchitis"

## 2013-08-27 ENCOUNTER — Other Ambulatory Visit: Payer: Self-pay | Admitting: *Deleted

## 2013-08-27 DIAGNOSIS — E119 Type 2 diabetes mellitus without complications: Secondary | ICD-10-CM

## 2013-08-27 DIAGNOSIS — E785 Hyperlipidemia, unspecified: Secondary | ICD-10-CM

## 2013-09-05 ENCOUNTER — Ambulatory Visit (INDEPENDENT_AMBULATORY_CARE_PROVIDER_SITE_OTHER): Payer: Medicare Other | Admitting: Family Medicine

## 2013-09-05 ENCOUNTER — Encounter: Payer: Self-pay | Admitting: Family Medicine

## 2013-09-05 VITALS — BP 130/78 | HR 74 | Temp 97.9°F | Resp 18 | Ht 65.0 in | Wt 170.0 lb

## 2013-09-05 DIAGNOSIS — B353 Tinea pedis: Secondary | ICD-10-CM

## 2013-09-05 DIAGNOSIS — E119 Type 2 diabetes mellitus without complications: Secondary | ICD-10-CM

## 2013-09-05 LAB — COMPLETE METABOLIC PANEL WITH GFR
ALT: 12 U/L (ref 0–35)
AST: 15 U/L (ref 0–37)
Albumin: 4.1 g/dL (ref 3.5–5.2)
Alkaline Phosphatase: 57 U/L (ref 39–117)
BILIRUBIN TOTAL: 0.4 mg/dL (ref 0.2–1.2)
BUN: 16 mg/dL (ref 6–23)
CHLORIDE: 105 meq/L (ref 96–112)
CO2: 21 mEq/L (ref 19–32)
CREATININE: 0.85 mg/dL (ref 0.50–1.10)
Calcium: 9 mg/dL (ref 8.4–10.5)
GFR, Est African American: 83 mL/min
GFR, Est Non African American: 72 mL/min
Glucose, Bld: 114 mg/dL — ABNORMAL HIGH (ref 70–99)
POTASSIUM: 4.3 meq/L (ref 3.5–5.3)
SODIUM: 138 meq/L (ref 135–145)
Total Protein: 6.7 g/dL (ref 6.0–8.3)

## 2013-09-05 LAB — CBC WITH DIFFERENTIAL/PLATELET
Basophils Absolute: 0.1 10*3/uL (ref 0.0–0.1)
Basophils Relative: 1 % (ref 0–1)
Eosinophils Absolute: 0.5 10*3/uL (ref 0.0–0.7)
Eosinophils Relative: 4 % (ref 0–5)
HCT: 40.9 % (ref 36.0–46.0)
Hemoglobin: 13.9 g/dL (ref 12.0–15.0)
Lymphocytes Relative: 26 % (ref 12–46)
Lymphs Abs: 3.4 10*3/uL (ref 0.7–4.0)
MCH: 28.9 pg (ref 26.0–34.0)
MCHC: 34 g/dL (ref 30.0–36.0)
MCV: 85 fL (ref 78.0–100.0)
MONOS PCT: 8 % (ref 3–12)
Monocytes Absolute: 1 10*3/uL (ref 0.1–1.0)
NEUTROS ABS: 8 10*3/uL — AB (ref 1.7–7.7)
NEUTROS PCT: 61 % (ref 43–77)
Platelets: 185 10*3/uL (ref 150–400)
RBC: 4.81 MIL/uL (ref 3.87–5.11)
RDW: 14.4 % (ref 11.5–15.5)
WBC: 13.1 10*3/uL — ABNORMAL HIGH (ref 4.0–10.5)

## 2013-09-05 LAB — HEMOGLOBIN A1C
HEMOGLOBIN A1C: 6.4 % — AB (ref <5.7)
MEAN PLASMA GLUCOSE: 137 mg/dL — AB (ref <117)

## 2013-09-05 MED ORDER — IBUPROFEN 800 MG PO TABS: 800.0000 mg | ORAL_TABLET | Freq: Three times a day (TID) | ORAL | Status: DC | PRN

## 2013-09-05 MED ORDER — FLUCONAZOLE 150 MG PO TABS: ORAL_TABLET | ORAL | Status: DC

## 2013-09-05 MED ORDER — CLOTRIMAZOLE 1 % EX CREA: 1.0000 "application " | TOPICAL_CREAM | Freq: Two times a day (BID) | CUTANEOUS | Status: DC

## 2013-09-05 NOTE — Progress Notes (Signed)
   Subjective:    Patient ID: Kathleen Ramirez, female    DOB: 05-27-1945, 68 y.o.   MRN: [id number]  HPI  Patient has a painful rash on the dorsum of her left foot for several weeks. It is teardrop-shaped.  It measures 5 cm long by 4 cm wide at its longest point.  He has a raised rolled border the center of the rash is depressed.  There is copious fine white scale in the center. There is no other associated rashes on the rest of her body. Past Medical History  Diagnosis Date  . Cancer   . Cervical cancer   . Tumors     5tumors removed  . Bilateral ovarian tumors   . Dysplastic colon polyp   . Yeast vaginitis   . UTI (lower urinary tract infection)    Current Outpatient Prescriptions on File Prior to Visit  Medication Sig Dispense Refill  . meloxicam (MOBIC) 15 MG tablet Take 1 tablet (15 mg total) by mouth daily.  30 tablet  0   No current facility-administered medications on file prior to visit.   Allergies  Allergen Reactions  . Codeine Nausea And Vomiting     makes heart race   History   Social History  . Marital Status: Single    Spouse Name: N/A    Number of Children: N/A  . Years of Education: N/A   Occupational History  . Not on file.   Social History Main Topics  . Smoking status: Current Every Day Smoker -- 0.50 packs/day    Types: Cigarettes  . Smokeless tobacco: Never Used  . Alcohol Use: Yes     Comment: occasionally  . Drug Use: No  . Sexual Activity: Yes     Comment: single female partner   Other Topics Concern  . Not on file   Social History Narrative  . No narrative on file     Review of Systems  All other systems reviewed and are negative.      Objective:   Physical Exam  Vitals reviewed. Cardiovascular: Normal rate and regular rhythm.   Pulmonary/Chest: Effort normal and breath sounds normal.  Skin: Rash noted. There is erythema.   rash as described in the history of present illness.        Assessment & Plan:  1. Type II or  unspecified type diabetes mellitus without mention of complication, not stated as uncontrolled Patient is also overdue for evaluation of her diabetes. Overall check a CMP and hemoglobin A1c today. She is nonfasting and I cannot check her fasting lipid panel. - CBC with Differential - COMPLETE METABOLIC PANEL WITH GFR - Hemoglobin A1c  2. Tinea pedis of left foot KOH today was positive for hyphae. Treat the patient with Diflucan 150 mg by mouth every other day for 2 weeks and Lotrimin twice daily for 2 weeks. Recheck if no better or sooner if worse. - fluconazole (DIFLUCAN) 150 MG tablet; Take it every other day until gone.  Dispense: 7 tablet; Refill: 0 - clotrimazole (LOTRIMIN) 1 % cream; Apply 1 application topically 2 (two) times daily.  Dispense: 30 g; Refill: 0

## 2013-09-07 ENCOUNTER — Encounter: Payer: Self-pay | Admitting: Family Medicine

## 2013-10-17 ENCOUNTER — Ambulatory Visit (INDEPENDENT_AMBULATORY_CARE_PROVIDER_SITE_OTHER): Payer: Medicare Other | Admitting: Physician Assistant

## 2013-10-17 ENCOUNTER — Encounter: Payer: Self-pay | Admitting: Physician Assistant

## 2013-10-17 VITALS — BP 110/76 | HR 62 | Temp 98.0°F | Resp 18 | Ht <= 58 in | Wt 165.0 lb

## 2013-10-17 DIAGNOSIS — N390 Urinary tract infection, site not specified: Secondary | ICD-10-CM

## 2013-10-17 LAB — URINALYSIS, ROUTINE W REFLEX MICROSCOPIC
Bilirubin Urine: NEGATIVE
Glucose, UA: NEGATIVE mg/dL
KETONES UR: NEGATIVE mg/dL
NITRITE: POSITIVE — AB
PH: 5.5 (ref 5.0–8.0)
Protein, ur: 100 mg/dL — AB
Specific Gravity, Urine: 1.02 (ref 1.005–1.030)
UROBILINOGEN UA: 1 mg/dL (ref 0.0–1.0)

## 2013-10-17 LAB — URINALYSIS, MICROSCOPIC ONLY
CASTS: NONE SEEN
Crystals: NONE SEEN

## 2013-10-17 MED ORDER — CIPROFLOXACIN HCL 500 MG PO TABS: 500.0000 mg | ORAL_TABLET | Freq: Two times a day (BID) | ORAL | Status: DC

## 2013-10-17 NOTE — Progress Notes (Signed)
Patient ID: Kathleen Ramirez MRN: [id number], DOB: 1945-04-10, 68 y.o. Date of Encounter: 10/17/2013, 3:11 PM    Chief Complaint:  Chief Complaint  Patient presents with  . Urinary Tract Infection    took AZO at 7a this morning     HPI: 68 y.o. year old female says that for the last couple of days she had noticed that her urine seemed malodorous and darker than usual. However wasn't until last night that she started to really have dysuria with burning and pain with urination. Also developed frequency. Was feeling like she has to urinate repeatedly but very little would come out. Has had no fevers or chills and no unilateral back pain. Is having no vaginal irritation or itching and no vaginal discharge. Says that she has had urinary tract infections in the past and has also had yeast infections in the past and this feels the same as her usual UTIs. Says that also in the past her symptoms have come on quickly with prior UTIs.     Home Meds:   Outpatient Prescriptions Prior to Visit  Medication Sig Dispense Refill  . clotrimazole (LOTRIMIN) 1 % cream Apply 1 application topically 2 (two) times daily.  30 g  0  . ibuprofen (ADVIL,MOTRIN) 800 MG tablet Take 1 tablet (800 mg total) by mouth every 8 (eight) hours as needed.  30 tablet  2  . meloxicam (MOBIC) 15 MG tablet Take 1 tablet (15 mg total) by mouth daily.  30 tablet  0  . fluconazole (DIFLUCAN) 150 MG tablet Take it every other day until gone.  7 tablet  0   No facility-administered medications prior to visit.    Allergies:  Allergies  Allergen Reactions  . Codeine Nausea And Vomiting     makes heart race      Review of Systems: See HPI for pertinent ROS. All other ROS negative.    Physical Exam: Blood pressure 110/76, pulse 62, temperature 98 F (36.7 C), temperature source Oral, resp. rate 18, height 4' 6.5" (1.384 m), weight 165 lb (74.844 kg)., Body mass index is 39.07 kg/(m^2). General: WNWD WF. Appears in no acute  distress. Neck: Supple. No thyromegaly. No lymphadenopathy. Lungs: Clear bilaterally to auscultation without wheezes, rales, or rhonchi. Breathing is unlabored. Heart: Regular rhythm. No murmurs, rubs, or gallops. Abdomen: Soft,  non-distended with normoactive bowel sounds. No hepatomegaly. No rebound/guarding. No obvious abdominal masses. Positive tenderness with palpation of suprapubic area. Msk:  Strength and tone normal for age. No tenderness with percussion of costophrenic angles bilaterally. Extremities/Skin: Warm and dry.  Neuro: Alert and oriented X 3. Moves all extremities spontaneously. Gait is normal. CNII-XII grossly in tact. Psych:  Responds to questions appropriately with a normal affect.   Results for orders placed in visit on 10/17/13  URINALYSIS, ROUTINE W REFLEX MICROSCOPIC      Result Value Ref Range   Color, Urine ORANGE (*) YELLOW   APPearance CLEAR  CLEAR   Specific Gravity, Urine 1.020  1.005 - 1.030   pH 5.5  5.0 - 8.0   Glucose, UA NEG  NEG mg/dL   Bilirubin Urine NEG  NEG   Ketones, ur NEG  NEG mg/dL   Hgb urine dipstick LARGE (*) NEG   Protein, ur 100 (*) NEG mg/dL   Urobilinogen, UA 1  0.0 - 1.0 mg/dL   Nitrite POS (*) NEG   Leukocytes, UA SMALL (*) NEG  URINALYSIS, MICROSCOPIC ONLY      Result Value Ref  Range   Squamous Epithelial / LPF FEW  RARE   Crystals NONE SEEN  NONE SEEN   Casts NONE SEEN  NONE SEEN   WBC, UA 11-20 (*) <3 WBC/hpf   RBC / HPF 11-20 (*) <3 RBC/hpf   Bacteria, UA MANY (*) RARE     ASSESSMENT AND PLAN:  68 y.o. year old female with  1. Urinary tract infection without hematuria, site unspecified - Urinalysis, Routine w reflex microscopic - ciprofloxacin (CIPRO) 500 MG tablet; Take 1 tablet (500 mg total) by mouth 2 (two) times daily.  Dispense: 14 tablet; Refill: 0  She is to start the antibiotic immediately and complete all of it. She is to followup if symptoms worsen or she develops fever or if symptoms do not resolve with  completion of antibiotic.   [address] [place], [place], [facility] 10/17/2013 3:11 PM

## 2013-10-25 ENCOUNTER — Encounter: Payer: Self-pay | Admitting: Family Medicine

## 2013-10-25 ENCOUNTER — Encounter: Payer: Medicare Other | Admitting: Family Medicine

## 2013-10-25 NOTE — Progress Notes (Signed)
This encounter was created in error - please disregard.

## 2013-12-20 ENCOUNTER — Ambulatory Visit (INDEPENDENT_AMBULATORY_CARE_PROVIDER_SITE_OTHER): Payer: Medicare Other | Admitting: Family Medicine

## 2013-12-20 ENCOUNTER — Encounter: Payer: Self-pay | Admitting: Family Medicine

## 2013-12-20 VITALS — BP 128/74 | HR 80 | Temp 97.8°F | Resp 18 | Ht 65.0 in | Wt 168.0 lb

## 2013-12-20 DIAGNOSIS — Z23 Encounter for immunization: Secondary | ICD-10-CM

## 2013-12-20 DIAGNOSIS — E119 Type 2 diabetes mellitus without complications: Secondary | ICD-10-CM

## 2013-12-20 LAB — COMPREHENSIVE METABOLIC PANEL
ALT: 8 U/L (ref 0–35)
AST: 12 U/L (ref 0–37)
Albumin: 4.1 g/dL (ref 3.5–5.2)
Alkaline Phosphatase: 56 U/L (ref 39–117)
BILIRUBIN TOTAL: 0.4 mg/dL (ref 0.2–1.2)
BUN: 19 mg/dL (ref 6–23)
CALCIUM: 8.9 mg/dL (ref 8.4–10.5)
CHLORIDE: 101 meq/L (ref 96–112)
CO2: 26 meq/L (ref 19–32)
Creat: 0.99 mg/dL (ref 0.50–1.10)
GLUCOSE: 146 mg/dL — AB (ref 70–99)
Potassium: 4.3 mEq/L (ref 3.5–5.3)
Sodium: 136 mEq/L (ref 135–145)
TOTAL PROTEIN: 6.4 g/dL (ref 6.0–8.3)

## 2013-12-20 LAB — LIPID PANEL
Cholesterol: 184 mg/dL (ref 0–200)
HDL: 49 mg/dL (ref >39)
LDL Cholesterol: 118 mg/dL — ABNORMAL HIGH (ref 0–99)
Total CHOL/HDL Ratio: 3.8 Ratio
Triglycerides: 87 mg/dL (ref <150)
VLDL: 17 mg/dL (ref 0–40)

## 2013-12-20 LAB — HEMOGLOBIN A1C
HEMOGLOBIN A1C: 6.5 % — AB (ref <5.7)
MEAN PLASMA GLUCOSE: 140 mg/dL — AB (ref <117)

## 2013-12-20 NOTE — Addendum Note (Signed)
Addended by: Shary Decamp B on: 12/20/2013 12:18 PM   Modules accepted: Orders

## 2013-12-20 NOTE — Progress Notes (Signed)
Subjective:    Patient ID: Kathleen Ramirez, female    DOB: January 19, 1946, 68 y.o.   MRN: [id number]  HPI Patient was last seen this summer. She has mild aortic stenosis, borderline diabetes mellitus type 2, hyperlipidemia. She continues to smoke. Overall she is asymptomatic. She denies any chest pain shortness of breath or dyspnea on exertion. She is not checking her blood pressures or her blood sugars. Her blood pressure is well controlled today. She has no desire to quit smoking. She is overdue for fasting lipid panel as well as a hemoglobin A1c. She is due for a flu shot but she refuses that today. She will consent to a pneumonia vaccine. Past Medical History  Diagnosis Date  . Cancer   . Cervical cancer   . Tumors     5tumors removed  . Bilateral ovarian tumors   . Dysplastic colon polyp   . Yeast vaginitis   . UTI (lower urinary tract infection)   . Diabetes mellitus without complication    Past Surgical History  Procedure Laterality Date  . Abdominal hysterectomy    . Tubal ligation    . Tonsillectomy    . Bladder surgery    . Breast lumpectomy    . Tumor excision      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  . Oophrectomy    . Bladder neck suspension    . Appendectomy    . Orif ulnar fracture  10/19/2011    Procedure: OPEN REDUCTION INTERNAL FIXATION (ORIF) ULNAR FRACTURE;  Surgeon: Linna Hoff, MD;  Location: [place];  Service: Orthopedics;  Laterality: Right;   Current Outpatient Prescriptions on File Prior to Visit  Medication Sig Dispense Refill  . ibuprofen (ADVIL,MOTRIN) 800 MG tablet Take 1 tablet (800 mg total) by mouth every 8 (eight) hours as needed. 30 tablet 2   No current facility-administered medications on file prior to visit.   Allergies  Allergen Reactions  . Codeine Nausea And Vomiting     makes heart race   History   Social History  . Marital Status: Single    Spouse Name: N/A    Number of Children: N/A  . Years of Education: N/A    Occupational History  . Not on file.   Social History Main Topics  . Smoking status: Current Every Day Smoker -- 0.50 packs/day    Types: Cigarettes  . Smokeless tobacco: Never Used  . Alcohol Use: Yes     Comment: occasionally  . Drug Use: No  . Sexual Activity: Yes     Comment: single female partner   Other Topics Concern  . Not on file   Social History Narrative      Review of Systems  All other systems reviewed and are negative.      Objective:   Physical Exam  Constitutional: She appears well-developed and well-nourished.  Cardiovascular: Normal rate, regular rhythm and normal heart sounds.   No murmur heard. Pulmonary/Chest: Effort normal and breath sounds normal. No respiratory distress. She has no wheezes. She has no rales.  Abdominal: Soft. Bowel sounds are normal. She exhibits no distension. There is no tenderness. There is no rebound and no guarding.  Vitals reviewed.         Assessment & Plan:  Diabetes mellitus type II, controlled - Plan: Comprehensive metabolic panel, Hemoglobin A1c, Lipid panel  Blood pressure is well controlled. I will check a hemoglobin A1c. I will also check a fasting lipid panel. Goal LDL is less  than 100. I recommended a flu shot which the patient refuses. I recommend smoking cessation. I will give the patient Pneumovax 23 today.

## 2013-12-20 NOTE — Addendum Note (Signed)
Addended by: Shary Decamp B on: 12/20/2013 12:23 PM   Modules accepted: Orders

## 2014-01-18 ENCOUNTER — Encounter: Payer: Self-pay | Admitting: Physician Assistant

## 2014-01-18 ENCOUNTER — Ambulatory Visit (INDEPENDENT_AMBULATORY_CARE_PROVIDER_SITE_OTHER): Payer: Medicare Other | Admitting: Physician Assistant

## 2014-01-18 VITALS — BP 138/82 | HR 60 | Temp 98.0°F | Resp 18 | Wt 165.0 lb

## 2014-01-18 DIAGNOSIS — J45909 Unspecified asthma, uncomplicated: Secondary | ICD-10-CM

## 2014-01-18 DIAGNOSIS — E1165 Type 2 diabetes mellitus with hyperglycemia: Secondary | ICD-10-CM

## 2014-01-18 DIAGNOSIS — J439 Emphysema, unspecified: Secondary | ICD-10-CM

## 2014-01-18 DIAGNOSIS — F17200 Nicotine dependence, unspecified, uncomplicated: Secondary | ICD-10-CM

## 2014-01-18 DIAGNOSIS — B9689 Other specified bacterial agents as the cause of diseases classified elsewhere: Secondary | ICD-10-CM

## 2014-01-18 DIAGNOSIS — J988 Other specified respiratory disorders: Secondary | ICD-10-CM

## 2014-01-18 DIAGNOSIS — Z720 Tobacco use: Secondary | ICD-10-CM

## 2014-01-18 DIAGNOSIS — J441 Chronic obstructive pulmonary disease with (acute) exacerbation: Secondary | ICD-10-CM

## 2014-01-18 MED ORDER — ALBUTEROL SULFATE HFA 108 (90 BASE) MCG/ACT IN AERS: 2.0000 | INHALATION_SPRAY | Freq: Four times a day (QID) | RESPIRATORY_TRACT | Status: DC | PRN

## 2014-01-18 MED ORDER — AZITHROMYCIN 250 MG PO TABS: ORAL_TABLET | ORAL | Status: DC

## 2014-01-18 NOTE — Progress Notes (Signed)
Patient ID: Kathleen Ramirez MRN: [id number], DOB: 1945/04/22, 68 y.o. Date of Encounter: 01/18/2014, 9:51 AM    Chief Complaint:  Chief Complaint  Patient presents with  . sick x 1 week    bad chest cold  OTC not helping     HPI: 68 y.o. year old female presents with above symptoms.  She says that she started with head/nasal congestion but then it developed cough and now it is bad in her chest. Says that she has been using Mucinex and Delsym but still has cough. Just over the past day has felt like she has had some wheezing at times. As had no fevers or chills.  Prior to this illness, was smoking almost 1 pack per day.    Home Meds:   Outpatient Prescriptions Prior to Visit  Medication Sig Dispense Refill  . ibuprofen (ADVIL,MOTRIN) 800 MG tablet Take 1 tablet (800 mg total) by mouth every 8 (eight) hours as needed. 30 tablet 2   No facility-administered medications prior to visit.    Allergies:  Allergies  Allergen Reactions  . Codeine Nausea And Vomiting     makes heart race      Review of Systems: See HPI for pertinent ROS. All other ROS negative.    Physical Exam: Blood pressure 138/82, pulse 60, temperature 98 F (36.7 C), temperature source Oral, resp. rate 18, weight 165 lb (74.844 kg), SpO2 98 %., Body mass index is 27.46 kg/(m^2). General:  WF. Appears in no acute distress. HEENT: Normocephalic, atraumatic, eyes without discharge, sclera non-icteric, nares are without discharge. Bilateral auditory canals clear, TM's are without perforation, pearly grey and translucent with reflective cone of light bilaterally. Oral cavity moist, posterior pharynx without exudate, erythema, peritonsillar abscess. No tenderness with percussion of frontal or maxillary sinuses bilaterally.  Neck: Supple. No thyromegaly. No lymphadenopathy. Lungs: Breath sounds are very distant and decreased throughout bilaterally. However I hear no wheezes and no rhonchi. Heart: Regular rhythm. No  murmurs, rubs, or gallops. Msk:  Strength and tone normal for age. Extremities/Skin: Warm and dry. Neuro: Alert and oriented X 3. Moves all extremities spontaneously. Gait is normal. CNII-XII grossly in tact. Psych:  Responds to questions appropriately with a normal affect.     ASSESSMENT AND PLAN:  68 y.o. year old female with  1. Bacterial respiratory infection - azithromycin (ZITHROMAX) 250 MG tablet; Day 1: Take 2 daily.  Days 2-5: Take 1 daily.  Dispense: 6 tablet; Refill: 0 - albuterol (PROVENTIL HFA;VENTOLIN HFA) 108 (90 BASE) MCG/ACT inhaler; Inhale 2 puffs into the lungs every 6 (six) hours as needed for wheezing or shortness of breath.  Dispense: 1 Inhaler; Refill: 0  2. COPD exacerbation - azithromycin (ZITHROMAX) 250 MG tablet; Day 1: Take 2 daily.  Days 2-5: Take 1 daily.  Dispense: 6 tablet; Refill: 0 - albuterol (PROVENTIL HFA;VENTOLIN HFA) 108 (90 BASE) MCG/ACT inhaler; Inhale 2 puffs into the lungs every 6 (six) hours as needed for wheezing or shortness of breath.  Dispense: 1 Inhaler; Refill: 0  3. TOBACCO ABUSE  4. Extrinsic asthma, unspecified asthma severity, uncomplicated  5. Pulmonary emphysema, unspecified emphysema type  6. Type 2 diabetes mellitus with hyperglycemia  She is to complete the antibiotic as directed. She can use the albuterol if needed for any wheezing as directed. Recommended she use Mucinex DM or some type of expectorant with guaifenesin. Follow-up if symptoms worsen significantly or if symptoms do not resolve within 1 week after completion of antibiotic.   Signed,  [address] [place], [place], [facility] 01/18/2014 9:51 AM
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
## 2014-06-22 ENCOUNTER — Encounter: Payer: Self-pay | Admitting: Family Medicine

## 2014-06-22 ENCOUNTER — Ambulatory Visit (INDEPENDENT_AMBULATORY_CARE_PROVIDER_SITE_OTHER): Payer: Medicare Other | Admitting: Family Medicine

## 2014-06-22 VITALS — BP 118/70 | HR 80 | Temp 97.9°F | Resp 16 | Ht 65.0 in | Wt 159.0 lb

## 2014-06-22 DIAGNOSIS — M542 Cervicalgia: Secondary | ICD-10-CM

## 2014-06-22 DIAGNOSIS — S030XXA Dislocation of jaw, initial encounter: Secondary | ICD-10-CM | POA: Diagnosis not present

## 2014-06-22 DIAGNOSIS — R0789 Other chest pain: Secondary | ICD-10-CM | POA: Diagnosis not present

## 2014-06-22 DIAGNOSIS — S0300XA Dislocation of jaw, unspecified side, initial encounter: Secondary | ICD-10-CM

## 2014-06-22 MED ORDER — PREDNISONE 20 MG PO TABS: ORAL_TABLET | ORAL | Status: DC

## 2014-06-22 MED ORDER — DIAZEPAM 10 MG PO TABS: 10.0000 mg | ORAL_TABLET | Freq: Three times a day (TID) | ORAL | Status: DC | PRN

## 2014-06-22 NOTE — Progress Notes (Signed)
Subjective:    Patient ID: Kathleen Ramirez, female    DOB: January 16, 1946, 68 y.o.   MRN: [id number]  HPI Patient has a history of severe TMJ. I saw the patient last May for this. She complains today of 3 weeks of severe pain on the left side of her head. The pain encircles her entire left ear. However she also reports pain radiating from her neck into the ear and into the occiput. It hurts to turn her chin to the left. She has palpable tenderness in the left side of the neck all along the trapezius. She denies any rectal. She denies any injury to the neck. She denies any numbness or tingling radiating down her left arm. She does complain of episodic sharp left-sided chest wall pain. Pain last just a brief few seconds and then resolve spontaneously. There is no shortness of breath. There is no relationship to exertion. EKG today in the office shows normal sinus rhythm with no evidence of ischemia or infarction Past Medical History  Diagnosis Date  . Cancer   . Cervical cancer   . Tumors     5tumors removed  . Bilateral ovarian tumors   . Dysplastic colon polyp   . Yeast vaginitis   . UTI (lower urinary tract infection)   . Diabetes mellitus without complication    Past Surgical History  Procedure Laterality Date  . Abdominal hysterectomy    . Tubal ligation    . Tonsillectomy    . Bladder surgery    . Breast lumpectomy    . Tumor excision      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  . Oophrectomy    . Bladder neck suspension    . Appendectomy    . Orif ulnar fracture  10/19/2011    Procedure: OPEN REDUCTION INTERNAL FIXATION (ORIF) ULNAR FRACTURE;  Surgeon: Linna Hoff, MD;  Location: [place];  Service: Orthopedics;  Laterality: Right;   Current Outpatient Prescriptions on File Prior to Visit  Medication Sig Dispense Refill  . albuterol (PROVENTIL HFA;VENTOLIN HFA) 108 (90 BASE) MCG/ACT inhaler Inhale 2 puffs into the lungs every 6 (six) hours as needed for wheezing or shortness of  breath. 1 Inhaler 0  . ibuprofen (ADVIL,MOTRIN) 800 MG tablet Take 1 tablet (800 mg total) by mouth every 8 (eight) hours as needed. 30 tablet 2   No current facility-administered medications on file prior to visit.   Allergies  Allergen Reactions  . Codeine Nausea And Vomiting     makes heart race   History   Social History  . Marital Status: Single    Spouse Name: N/A  . Number of Children: N/A  . Years of Education: N/A   Occupational History  . Not on file.   Social History Main Topics  . Smoking status: Current Every Day Smoker -- 0.50 packs/day    Types: Cigarettes  . Smokeless tobacco: Never Used  . Alcohol Use: Yes     Comment: occasionally  . Drug Use: No  . Sexual Activity: Yes     Comment: single female partner   Other Topics Concern  . Not on file   Social History Narrative      Review of Systems  All other systems reviewed and are negative.      Objective:   Physical Exam  Constitutional: She appears well-developed and well-nourished.  HENT:  Right Ear: Tympanic membrane, external ear and ear canal normal.  Left Ear: Tympanic membrane, external ear and ear  canal normal.  Nose: Nose normal.  Mouth/Throat: Oropharynx is clear and moist. No oropharyngeal exudate.  Neck: Neck supple.  Cardiovascular: Normal rate, regular rhythm and normal heart sounds.   No murmur heard. Pulmonary/Chest: Effort normal and breath sounds normal. No respiratory distress. She has no wheezes. She has no rales. She exhibits no tenderness.  Abdominal: Soft. Bowel sounds are normal.  Musculoskeletal:       Cervical back: She exhibits decreased range of motion, tenderness, pain and spasm. She exhibits no bony tenderness.  Lymphadenopathy:    She has no cervical adenopathy.  Neurological: She has normal reflexes. She displays normal reflexes. She exhibits normal muscle tone.  Vitals reviewed.         Assessment & Plan:  Other chest pain - Plan: EKG 12-Lead  TMJ  (dislocation of temporomandibular joint), initial encounter - Plan: predniSONE (DELTASONE) 20 MG tablet, diazepam (VALIUM) 10 MG tablet  Neck pain on left side  I believe the patient is having left-sided ear pain most likely from TMJ or possibly cervicogenic headache due to a pinched nerve in her neck. At this point I will treat the pain with a prednisone taper pack to calm inflammation and Valium 10 mg tablets, 1/2-1 tablet every 8 hours as needed for muscle pain. Recheck in one week if no better or sooner if worse. Chest pain is very atypical. Furthermore she has a normal EKG. I believe this is most likely musculoskeletal chest pain. However if the pain changes or worsens sheto go to the hospital immediately. If the pain intensifies I would also like to obtain a stress test. Patient agrees. At the present time she was to focus on fixing her headache

## 2014-09-05 ENCOUNTER — Encounter: Payer: Self-pay | Admitting: Family Medicine

## 2014-09-05 ENCOUNTER — Ambulatory Visit (INDEPENDENT_AMBULATORY_CARE_PROVIDER_SITE_OTHER): Payer: Medicare Other | Admitting: Family Medicine

## 2014-09-05 ENCOUNTER — Ambulatory Visit
Admission: RE | Admit: 2014-09-05 | Discharge: 2014-09-05 | Disposition: A | Discharge status: Home / Self Care | Payer: Medicare Other | Source: Ambulatory Visit | Attending: Family Medicine | Admitting: Family Medicine

## 2014-09-05 VITALS — BP 138/74 | HR 72 | Temp 98.0°F | Resp 16 | Ht 65.0 in | Wt 164.0 lb

## 2014-09-05 DIAGNOSIS — R8290 Unspecified abnormal findings in urine: Secondary | ICD-10-CM

## 2014-09-05 DIAGNOSIS — M542 Cervicalgia: Secondary | ICD-10-CM

## 2014-09-05 DIAGNOSIS — N3000 Acute cystitis without hematuria: Secondary | ICD-10-CM | POA: Diagnosis not present

## 2014-09-05 LAB — URINALYSIS, ROUTINE W REFLEX MICROSCOPIC
Bilirubin Urine: NEGATIVE
GLUCOSE, UA: 100 mg/dL — AB
Ketones, ur: NEGATIVE mg/dL
Nitrite: POSITIVE — AB
PH: 6 (ref 5.0–8.0)
PROTEIN: NEGATIVE mg/dL
SPECIFIC GRAVITY, URINE: 1.01 (ref 1.005–1.030)
Urobilinogen, UA: 0.2 mg/dL (ref 0.0–1.0)

## 2014-09-05 LAB — URINALYSIS, MICROSCOPIC ONLY
CASTS: NONE SEEN
CRYSTALS: NONE SEEN

## 2014-09-05 MED ORDER — DICLOFENAC SODIUM 75 MG PO TBEC: 75.0000 mg | DELAYED_RELEASE_TABLET | Freq: Two times a day (BID) | ORAL | Status: DC

## 2014-09-05 MED ORDER — CIPROFLOXACIN HCL 500 MG PO TABS: 500.0000 mg | ORAL_TABLET | Freq: Two times a day (BID) | ORAL | Status: DC

## 2014-09-05 MED ORDER — FLUCONAZOLE 150 MG PO TABS: 150.0000 mg | ORAL_TABLET | Freq: Once | ORAL | Status: DC

## 2014-09-05 MED ORDER — CYCLOBENZAPRINE HCL 10 MG PO TABS: 10.0000 mg | ORAL_TABLET | Freq: Three times a day (TID) | ORAL | Status: DC | PRN

## 2014-09-05 NOTE — Progress Notes (Signed)
Subjective:    Patient ID: Kathleen Ramirez, female    DOB: [birth year], 69 y.o.   MRN: [id number]  HPI  Patient reports worsening left sided posterior neck pain x 2 months.  She reports crepitus, decreased range of motion, muscle tightness in the trapezius, pain with rotation left and right. She denies any pain with flexion or extension. She denies any numbness or tingling in her arms or weakness in her arms. She also reports foul-smelling urine for the last few days as well as some dysuria Past Medical History  Diagnosis Date  . Cancer   . Cervical cancer   . Tumors     5tumors removed  . Bilateral ovarian tumors   . Dysplastic colon polyp   . Yeast vaginitis   . UTI (lower urinary tract infection)   . Diabetes mellitus without complication    Past Surgical History  Procedure Laterality Date  . Abdominal hysterectomy    . Tubal ligation    . Tonsillectomy    . Bladder surgery    . Breast lumpectomy    . Tumor excision      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  . Oophrectomy    . Bladder neck suspension    . Appendectomy    . Orif ulnar fracture  10/19/2011    Procedure: OPEN REDUCTION INTERNAL FIXATION (ORIF) ULNAR FRACTURE;  Surgeon: Linna Hoff, MD;  Location: [facility];  Service: Orthopedics;  Laterality: Right;   Current Outpatient Prescriptions on File Prior to Visit  Medication Sig Dispense Refill  . albuterol (PROVENTIL HFA;VENTOLIN HFA) 108 (90 BASE) MCG/ACT inhaler Inhale 2 puffs into the lungs every 6 (six) hours as needed for wheezing or shortness of breath. 1 Inhaler 0   No current facility-administered medications on file prior to visit.   Allergies  Allergen Reactions  . Codeine Nausea And Vomiting     makes heart race   History   Social History  . Marital Status: Single    Spouse Name: N/A  . Number of Children: N/A  . Years of Education: N/A   Occupational History  . Not on file.   Social History Main Topics  . Smoking status: Current Every  Day Smoker -- 0.50 packs/day    Types: Cigarettes  . Smokeless tobacco: Never Used  . Alcohol Use: Yes     Comment: occasionally  . Drug Use: No  . Sexual Activity: Yes     Comment: single female partner   Other Topics Concern  . Not on file   Social History Narrative     Review of Systems  All other systems reviewed and are negative.      Objective:   Physical Exam  Cardiovascular: Normal rate, regular rhythm and normal heart sounds.   Pulmonary/Chest: Effort normal and breath sounds normal. No respiratory distress. She has no wheezes. She has no rales.  Musculoskeletal:       Cervical back: She exhibits decreased range of motion, tenderness, bony tenderness, pain and spasm.  Vitals reviewed.         Assessment & Plan:  Abnormal urine odor - Plan: Urinalysis, Routine w reflex microscopic (not at [facility])  Neck pain on left side - Plan: DG Cervical Spine Complete, diclofenac (VOLTAREN) 75 MG EC tablet, cyclobenzaprine (FLEXERIL) 10 MG tablet  Acute cystitis without hematuria - Plan: ciprofloxacin (CIPRO) 500 MG tablet  I suspect spondylosis with muscle spasms. Begin diclofenac 75 mg by mouth twice a day and Flexeril 10 mg  every 8 hours as needed. Proceed with an x-ray of the cervical spine. Patient also has a urinary tract infection which I will treat with Cipro 500 mg by mouth twice a day for 3 days.

## 2014-09-06 ENCOUNTER — Telehealth: Payer: Self-pay | Admitting: Family Medicine

## 2014-09-06 NOTE — Telephone Encounter (Signed)
Rec'd PA request for Cyclobenzaprine.  Submittd through "CoverMyMeds"  Case [id number].  Have already gotten call from [facility] with approval through end of this year.  Pharmacy made aware.

## 2014-10-17 ENCOUNTER — Other Ambulatory Visit: Payer: Self-pay | Admitting: Family Medicine

## 2014-11-17 ENCOUNTER — Ambulatory Visit (INDEPENDENT_AMBULATORY_CARE_PROVIDER_SITE_OTHER): Payer: Medicare Other | Admitting: Family Medicine

## 2014-11-17 ENCOUNTER — Encounter: Payer: Self-pay | Admitting: Family Medicine

## 2014-11-17 VITALS — BP 108/62 | HR 78 | Temp 98.0°F | Resp 18 | Ht 65.0 in | Wt 172.0 lb

## 2014-11-17 DIAGNOSIS — R350 Frequency of micturition: Secondary | ICD-10-CM

## 2014-11-17 LAB — URINALYSIS, ROUTINE W REFLEX MICROSCOPIC
BILIRUBIN URINE: NEGATIVE
GLUCOSE, UA: NEGATIVE
KETONES UR: NEGATIVE
Nitrite: NEGATIVE
PH: 6 (ref 5.0–8.0)
Protein, ur: NEGATIVE
SPECIFIC GRAVITY, URINE: 1.01 (ref 1.001–1.035)

## 2014-11-17 LAB — URINALYSIS, MICROSCOPIC ONLY
Casts: NONE SEEN [LPF]
Crystals: NONE SEEN [HPF]
YEAST: NONE SEEN [HPF]

## 2014-11-17 MED ORDER — DICLOFENAC SODIUM 75 MG PO TBEC: DELAYED_RELEASE_TABLET | ORAL | Status: DC

## 2014-11-17 NOTE — Progress Notes (Signed)
Subjective:    Patient ID: Kathleen Ramirez, female    DOB: [birth year], 69 y.o.   MRN: [id number]  HPI  09/05/14 Patient reports worsening left sided posterior neck pain x 2 months.  She reports crepitus, decreased range of motion, muscle tightness in the trapezius, pain with rotation left and right. She denies any pain with flexion or extension. She denies any numbness or tingling in her arms or weakness in her arms. She also reports foul-smelling urine for the last few days as well as some dysuria.  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 Begin diclofenac 75 mg by mouth twice a day and Flexeril 10 mg every 8 hours as needed. Proceed with an x-ray of the cervical spine. Patient also has a urinary tract infection which I will treat with Cipro 500 mg by mouth twice a day for 3 days.  Xray revealed: Diffuse multilevel severe degenerative change cervical spine. Prominent endplate osteophytes are noted at C3-C4, C4-C5, and C5-C6.  11/17/14 Patient continues to have polyuria, frequency, urgency, and foul smelling urine.  UA show +1 blood and LE.  Urine CX have been negative thus far. Past Medical History  Diagnosis Date  . Cancer   . Cervical cancer   . Tumors     5tumors removed  . Bilateral ovarian tumors   . Dysplastic colon polyp   . Yeast vaginitis   . UTI (lower urinary tract infection)   . Diabetes mellitus without complication    Past Surgical History  Procedure Laterality Date  . Abdominal hysterectomy    . Tubal ligation    . Tonsillectomy    . Bladder surgery    . Breast lumpectomy    . Tumor excision      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  . Oophrectomy    . Bladder neck suspension    . Appendectomy    . Orif ulnar fracture  10/19/2011    Procedure: OPEN REDUCTION INTERNAL FIXATION (ORIF) ULNAR FRACTURE;  Surgeon: Linna Hoff, MD;  Location: [place];  Service: Orthopedics;  Laterality: Right;   Current Outpatient Prescriptions on  File Prior to Visit  Medication Sig Dispense Refill  . albuterol (PROVENTIL HFA;VENTOLIN HFA) 108 (90 BASE) MCG/ACT inhaler Inhale 2 puffs into the lungs every 6 (six) hours as needed for wheezing or shortness of breath. 1 Inhaler 0  . ciprofloxacin (CIPRO) 500 MG tablet Take 1 tablet (500 mg total) by mouth 2 (two) times daily. 6 tablet 0  . cyclobenzaprine (FLEXERIL) 10 MG tablet Take 1 tablet (10 mg total) by mouth 3 (three) times daily as needed for muscle spasms. 30 tablet 0  . diclofenac (VOLTAREN) 75 MG EC tablet TAKE 1 TABLET(75 MG) BY MOUTH TWICE DAILY 30 tablet 0  . fluconazole (DIFLUCAN) 150 MG tablet Take 1 tablet (150 mg total) by mouth once. 1 tablet 0   No current facility-administered medications on file prior to visit.   Allergies  Allergen Reactions  . Codeine Nausea And Vomiting     makes heart race   Social History   Social History  . Marital Status: Single    Spouse Name: N/A  . Number of Children: N/A  . Years of Education: N/A   Occupational History  . Not on file.   Social History Main Topics  . Smoking status: Current Every Day Smoker -- 0.50 packs/day    Types: Cigarettes  . Smokeless tobacco: Never Used  . Alcohol Use: Yes  Comment: occasionally  . Drug Use: No  . Sexual Activity: Yes     Comment: single female partner   Other Topics Concern  . Not on file   Social History Narrative     Review of Systems  All other systems reviewed and are negative.      Objective:   Physical Exam  Cardiovascular: Normal rate, regular rhythm and normal heart sounds.   Pulmonary/Chest: Effort normal and breath sounds normal. No respiratory distress. She has no wheezes. She has no rales.  Musculoskeletal:       Cervical back: She exhibits decreased range of motion, tenderness, bony tenderness, pain and spasm.  Vitals reviewed.         Assessment & Plan:  Frequent urination - Plan: Urinalysis, Routine w reflex microscopic (not at [facility]), Urine  culture So far patient has never had a positive urine cx to explain her symptoms.  Check urine cx and treat only if positive.  Try myrbetriq 25 mg poqday for possible OAb.  If urine cx is negative, consult urology for possible cystoscopy given persistant hematuria.

## 2014-11-20 LAB — URINE CULTURE: Colony Count: >=100000

## 2014-11-22 ENCOUNTER — Other Ambulatory Visit: Payer: Self-pay | Admitting: Family Medicine

## 2014-11-22 DIAGNOSIS — N390 Urinary tract infection, site not specified: Secondary | ICD-10-CM

## 2014-11-22 MED ORDER — SULFAMETHOXAZOLE-TRIMETHOPRIM 800-160 MG PO TABS: 1.0000 | ORAL_TABLET | Freq: Two times a day (BID) | ORAL | Status: DC

## 2014-12-25 ENCOUNTER — Encounter: Payer: Self-pay | Admitting: Family Medicine

## 2014-12-25 ENCOUNTER — Ambulatory Visit (INDEPENDENT_AMBULATORY_CARE_PROVIDER_SITE_OTHER): Payer: Medicare Other | Admitting: Family Medicine

## 2014-12-25 VITALS — BP 110/60 | HR 68 | Temp 98.1°F | Resp 18 | Wt 178.0 lb

## 2014-12-25 DIAGNOSIS — Z87448 Personal history of other diseases of urinary system: Secondary | ICD-10-CM

## 2014-12-25 LAB — URINALYSIS, ROUTINE W REFLEX MICROSCOPIC
BILIRUBIN URINE: NEGATIVE
Glucose, UA: NEGATIVE
KETONES UR: NEGATIVE
Leukocytes, UA: NEGATIVE
NITRITE: NEGATIVE
Protein, ur: NEGATIVE
SPECIFIC GRAVITY, URINE: 1.01 (ref 1.001–1.035)
pH: 6 (ref 5.0–8.0)

## 2014-12-25 LAB — URINALYSIS, MICROSCOPIC ONLY
CASTS: NONE SEEN [LPF]
CRYSTALS: NONE SEEN [HPF]
Yeast: NONE SEEN [HPF]

## 2014-12-25 MED ORDER — MIRABEGRON ER 25 MG PO TB24: 25.0000 mg | ORAL_TABLET | Freq: Every day | ORAL | Status: DC

## 2014-12-25 NOTE — Progress Notes (Signed)
Subjective:    Patient ID: Kathleen Ramirez First, female    DOB: Apr 14, 1945, 69 y.o.   MRN: [id number]  HPI  09/05/14 Patient reports worsening left sided posterior neck pain x 2 months.  She reports crepitus, decreased range of motion, muscle tightness in the trapezius, pain with rotation left and right. She denies any pain with flexion or extension. She denies any numbness or tingling in her arms or weakness in her arms. She also reports foul-smelling urine for the last few days as well as some dysuria.  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 Begin diclofenac 75 mg by mouth twice a day and Flexeril 10 mg every 8 hours as needed. Proceed with an x-ray of the cervical spine. Patient also has a urinary tract infection which I will treat with Cipro 500 mg by mouth twice a day for 3 days.  Xray revealed: Diffuse multilevel severe degenerative change cervical spine. Prominent endplate osteophytes are noted at C3-C4, C4-C5, and C5-C6.  11/17/14 Patient continues to have polyuria, frequency, urgency, and foul smelling urine.  UA show +1 blood and LE.  Urine CX have been negative thus far.  At that time, my plan was: So far patient has never had a positive urine cx to explain her symptoms.  Check urine cx and treat only if positive.  Try myrbetriq 25 mg poqday for possible OAb.  If urine cx is negative, consult urology for possible cystoscopy given persistant hematuria.     12/25/14  patient's urine culture did show infection with Escherichia coli which we treated with Bactrim double strength tablets.   After the antibiotics, and the myrbetriq tabs,  The patient's symptoms totally subsided. She is here today to recheck her hematuria. She states that she felt much better on Myrbetriq.   This seemed to really help with the polyuria and urgency and frequency. She would like a prescription for this. On the medication she is asymptomatic. However she does have trace hematuria in her urine  today Past Medical History  Diagnosis Date  . Cancer ([place])   . Cervical cancer ([place])   . Tumors     5tumors removed  . Bilateral ovarian tumors   . Dysplastic colon polyp   . Yeast vaginitis   . UTI (lower urinary tract infection)   . Diabetes mellitus without complication [facility])    Past Surgical History  Procedure Laterality Date  . Abdominal hysterectomy    . Tubal ligation    . Tonsillectomy    . Bladder surgery    . Breast lumpectomy    . Tumor excision      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  . Oophrectomy    . Bladder neck suspension    . Appendectomy    . Orif ulnar fracture  10/19/2011    Procedure: OPEN REDUCTION INTERNAL FIXATION (ORIF) ULNAR FRACTURE;  Surgeon: Linna Hoff, MD;  Location: [place];  Service: Orthopedics;  Laterality: Right;   Current Outpatient Prescriptions on File Prior to Visit  Medication Sig Dispense Refill  . albuterol (PROVENTIL HFA;VENTOLIN HFA) 108 (90 BASE) MCG/ACT inhaler Inhale 2 puffs into the lungs every 6 (six) hours as needed for wheezing or shortness of breath. 1 Inhaler 0  . cyclobenzaprine (FLEXERIL) 10 MG tablet Take 1 tablet (10 mg total) by mouth 3 (three) times daily as needed for muscle spasms. 30 tablet 0  . diclofenac (VOLTAREN) 75 MG EC tablet TAKE 1 TABLET(75 MG) BY MOUTH TWICE  DAILY 30 tablet 5   No current facility-administered medications on file prior to visit.   Allergies  Allergen Reactions  . Codeine Nausea And Vomiting     makes heart race   Social History   Social History  . Marital Status: Single    Spouse Name: N/A  . Number of Children: N/A  . Years of Education: N/A   Occupational History  . Not on file.   Social History Main Topics  . Smoking status: Current Every Day Smoker -- 0.50 packs/day    Types: Cigarettes  . Smokeless tobacco: Never Used  . Alcohol Use: Yes     Comment: occasionally  . Drug Use: No  . Sexual Activity: Yes     Comment: single female partner   Other Topics  Concern  . Not on file   Social History Narrative     Review of Systems  All other systems reviewed and are negative.      Objective:   Physical Exam  Cardiovascular: Normal rate, regular rhythm and normal heart sounds.   Pulmonary/Chest: Effort normal and breath sounds normal. No respiratory distress. She has no wheezes. She has no rales.  Musculoskeletal:       Cervical back: She exhibits decreased range of motion, tenderness, bony tenderness, pain and spasm.  Vitals reviewed.         Assessment & Plan:  History of hematuria - Plan: Urinalysis, Routine w reflex microscopic (not at [facility])  hematuria persist. I recommended a CT scan of the kidneys to evaluate for any renal lesions are renal malignancies. I have recommended a urology consultation for cystoscopy to rule out bladder lesions. The patient declined this and would like to recheck a urine sample in 2 weeks. At that time, she will consent to a urology consultation if the hematuria persists. She continues to complain of neck pain however the pain in her neck improves if she takes diclofenac.

## 2014-12-29 ENCOUNTER — Ambulatory Visit: Payer: Medicare Other | Admitting: Family Medicine

## 2015-01-08 ENCOUNTER — Encounter: Payer: Self-pay | Admitting: Family Medicine

## 2015-01-08 ENCOUNTER — Ambulatory Visit (INDEPENDENT_AMBULATORY_CARE_PROVIDER_SITE_OTHER): Payer: Medicare Other | Admitting: Family Medicine

## 2015-01-08 VITALS — BP 150/88 | HR 78 | Temp 98.0°F | Resp 16 | Ht 65.0 in | Wt 177.0 lb

## 2015-01-08 DIAGNOSIS — R319 Hematuria, unspecified: Secondary | ICD-10-CM | POA: Diagnosis not present

## 2015-01-08 DIAGNOSIS — Z87448 Personal history of other diseases of urinary system: Secondary | ICD-10-CM | POA: Diagnosis not present

## 2015-01-08 LAB — URINALYSIS, ROUTINE W REFLEX MICROSCOPIC
Bilirubin Urine: NEGATIVE
Ketones, ur: NEGATIVE
LEUKOCYTES UA: NEGATIVE
NITRITE: NEGATIVE
Protein, ur: NEGATIVE
Specific Gravity, Urine: 1.01 (ref 1.001–1.035)
pH: 6.5 (ref 5.0–8.0)

## 2015-01-08 LAB — URINALYSIS, MICROSCOPIC ONLY
Casts: NONE SEEN [LPF]
Crystals: NONE SEEN [HPF]
Yeast: NONE SEEN [HPF]

## 2015-01-08 NOTE — Progress Notes (Signed)
Subjective:    Patient ID: Kathleen Ramirez, female    DOB: May 21, 1945, 69 y.o.   MRN: [id number]  HPI  09/05/14 Patient reports worsening left sided posterior neck pain x 2 months.  She reports crepitus, decreased range of motion, muscle tightness in the trapezius, pain with rotation left and right. She denies any pain with flexion or extension. She denies any numbness or tingling in her arms or weakness in her arms. She also reports foul-smelling urine for the last few days as well as some dysuria.  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 Begin diclofenac 75 mg by mouth twice a day and Flexeril 10 mg every 8 hours as needed. Proceed with an x-ray of the cervical spine. Patient also has a urinary tract infection which I will treat with Cipro 500 mg by mouth twice a day for 3 days.  Xray revealed: Diffuse multilevel severe degenerative change cervical spine. Prominent endplate osteophytes are noted at C3-C4, C4-C5, and C5-C6.  11/17/14 Patient continues to have polyuria, frequency, urgency, and foul smelling urine.  UA show +1 blood and LE.  Urine CX have been negative thus far.  At that time, my plan was: So far patient has never had a positive urine cx to explain her symptoms.  Check urine cx and treat only if positive.  Try myrbetriq 25 mg poqday for possible OAb.  If urine cx is negative, consult urology for possible cystoscopy given persistant hematuria.     12/25/14  patient's urine culture did show infection with Escherichia coli which we treated with Bactrim double strength tablets.   After the antibiotics, and the myrbetriq tabs,  The patient's symptoms totally subsided. She is here today to recheck her hematuria. She states that she felt much better on Myrbetriq.   This seemed to really help with the polyuria and urgency and frequency. She would like a prescription for this. On the medication she is asymptomatic. However she does have trace hematuria in her urine  today.  At that time, my plan was: Hematuria persists. I recommended a CT scan of the kidneys to evaluate for any renal lesions are renal malignancies. I have recommended a urology consultation for cystoscopy to rule out bladder lesions. The patient declined this and would like to recheck a urine sample in 2 weeks. At that time, she will consent to a urology consultation if the hematuria persists. She continues to complain of neck pain however the pain in her neck improves if she takes diclofenac.  01/08/15 She is here today for follow-up. Urinalysis continues to show persistent microscopic hematuria even know the patient is asymptomatic. She does have a history of a bladder sling operation performed by Dr. Jeffie Pollock and she would like to see him again if she does need cystoscopy. She also complains of almost daily now lower abdominal cramping pain. It sounds musculoskeletal in nature like she is getting cramps in her abdominal oblique muscles. It occurs only with movement. It is sharp and intense in nature and then relaxes.   Past Medical History  Diagnosis Date  . Cancer ([place])   . Cervical cancer ([place])   . Tumors     5tumors removed  . Bilateral ovarian tumors   . Dysplastic colon polyp   . Yeast vaginitis   . UTI (lower urinary tract infection)   . Diabetes mellitus without complication [facility])    Past Surgical History  Procedure Laterality Date  . Abdominal hysterectomy    .  Tubal ligation    . Tonsillectomy    . Bladder surgery    . Breast lumpectomy    . Tumor excision      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  . Oophrectomy    . Bladder neck suspension    . Appendectomy    . Orif ulnar fracture  10/19/2011    Procedure: OPEN REDUCTION INTERNAL FIXATION (ORIF) ULNAR FRACTURE;  Surgeon: Linna Hoff, MD;  Location: [place];  Service: Orthopedics;  Laterality: Right;   Current Outpatient Prescriptions on File Prior to Visit  Medication Sig Dispense Refill  . albuterol  (PROVENTIL HFA;VENTOLIN HFA) 108 (90 BASE) MCG/ACT inhaler Inhale 2 puffs into the lungs every 6 (six) hours as needed for wheezing or shortness of breath. 1 Inhaler 0  . cyclobenzaprine (FLEXERIL) 10 MG tablet Take 1 tablet (10 mg total) by mouth 3 (three) times daily as needed for muscle spasms. 30 tablet 0  . diclofenac (VOLTAREN) 75 MG EC tablet TAKE 1 TABLET(75 MG) BY MOUTH TWICE DAILY 30 tablet 5  . mirabegron ER (MYRBETRIQ) 25 MG TB24 tablet Take 1 tablet (25 mg total) by mouth daily. 30 tablet 2   No current facility-administered medications on file prior to visit.   Allergies  Allergen Reactions  . Codeine Nausea And Vomiting     makes heart race   Social History   Social History  . Marital Status: Single    Spouse Name: N/A  . Number of Children: N/A  . Years of Education: N/A   Occupational History  . Not on file.   Social History Main Topics  . Smoking status: Current Every Day Smoker -- 0.50 packs/day    Types: Cigarettes  . Smokeless tobacco: Never Used  . Alcohol Use: Yes     Comment: occasionally  . Drug Use: No  . Sexual Activity: Yes     Comment: single female partner   Other Topics Concern  . Not on file   Social History Narrative     Review of Systems  All other systems reviewed and are negative.      Objective:   Physical Exam  Cardiovascular: Normal rate, regular rhythm and normal heart sounds.   Pulmonary/Chest: Effort normal and breath sounds normal. No respiratory distress. She has no wheezes. She has no rales.  Abdominal: Soft. Bowel sounds are normal. She exhibits no distension and no mass. There is no tenderness. There is no rebound and no guarding.  Vitals reviewed.         Assessment & Plan:  Hematuria - Plan: Urinalysis, Routine w reflex microscopic (not at [facility]), Ambulatory referral to Urology, CT Abdomen Pelvis W Contrast  History of hematuria  I will consult urology, given the persistent microscopic hematuria, for  cystoscopy. I will also schedule the patient for a CT of abdomen and pelvis with contrast to evaluate for any renal based pathology.  Her crampy episodic abdominal pain could represent either cramps in the oblique muscles of her abdominal wall or possibly pain due to adhesions. If the CT scan of the abdomen and pelvis is normal, I will consider starting the patient on hydroxychloroquine for prevention of cramps.

## 2015-01-19 ENCOUNTER — Telehealth: Payer: Self-pay | Admitting: *Deleted

## 2015-01-19 DIAGNOSIS — R319 Hematuria, unspecified: Secondary | ICD-10-CM

## 2015-01-19 NOTE — Telephone Encounter (Signed)
Ok with ct of abd and pelvis with and without contrast.  Please change orders.

## 2015-01-19 NOTE — Telephone Encounter (Signed)
Order changed to CT abd/pel w and wo cm

## 2015-01-19 NOTE — Telephone Encounter (Signed)
Received a call from Radiology stating that pt is having a CT abd/pelvis with contrast done on Monday and stated the radiologist is wanting to know if you are looking for the kidneys for renal lesions that they prefer the scan to be abd/pelvis w & wo contrast.   If you are ok with this order needs to be changed to CT ABD/PEL w and wo contrast. Please advise  Call back number [phone]

## 2015-01-22 ENCOUNTER — Ambulatory Visit
Admission: RE | Admit: 2015-01-22 | Discharge: 2015-01-22 | Disposition: A | Discharge status: Home / Self Care | Payer: Medicare Other | Source: Ambulatory Visit | Attending: Family Medicine | Admitting: Family Medicine

## 2015-01-22 DIAGNOSIS — R319 Hematuria, unspecified: Secondary | ICD-10-CM

## 2015-01-22 MED ORDER — IOPAMIDOL (ISOVUE-300) INJECTION 61%
125.0000 mL | Freq: Once | INTRAVENOUS | Status: AC | PRN
  Administered 2015-01-22: 125 mL via INTRAVENOUS

## 2015-04-02 DIAGNOSIS — R351 Nocturia: Secondary | ICD-10-CM | POA: Diagnosis not present

## 2015-04-02 DIAGNOSIS — R3121 Asymptomatic microscopic hematuria: Secondary | ICD-10-CM | POA: Diagnosis not present

## 2015-04-02 DIAGNOSIS — Z0000 Encounter for general adult medical examination without abnormal findings: Secondary | ICD-10-CM | POA: Diagnosis not present

## 2015-04-17 ENCOUNTER — Telehealth: Payer: Self-pay | Admitting: *Deleted

## 2015-04-17 NOTE — Telephone Encounter (Signed)
Received request from pharmacy for PA on Myrbetriq.  PA submitted.   Dx: OAB- N32.81.

## 2015-04-24 ENCOUNTER — Telehealth: Payer: Self-pay | Admitting: *Deleted

## 2015-04-24 MED ORDER — TOLTERODINE TARTRATE 2 MG PO TABS: 2.0000 mg | ORAL_TABLET | Freq: Two times a day (BID) | ORAL | Status: DC

## 2015-04-24 NOTE — Telephone Encounter (Signed)
Pt called and told about new Rx

## 2015-04-24 NOTE — Telephone Encounter (Signed)
Received PA determination.   PA denied.   Formulary alternatives include: Detrol, Oxybutynin, Toviaz, and Vesicare.   MD please advise.

## 2015-04-24 NOTE — Telephone Encounter (Signed)
Prescription sent to pharmacy.  LMTRC ?

## 2015-04-24 NOTE — Telephone Encounter (Signed)
Can try detrol 2 mg pobid.

## 2015-04-24 NOTE — Telephone Encounter (Signed)
Received request from pharmacy for PA on Detrol.  PA submitted.   Dx: OAB- N32.81.

## 2015-04-30 MED ORDER — SOLIFENACIN SUCCINATE 10 MG PO TABS: 10.0000 mg | ORAL_TABLET | Freq: Every day | ORAL | Status: DC

## 2015-04-30 NOTE — Telephone Encounter (Signed)
Ok with vesicare 10 mg poqday but they told us detrol 2 bid initially.

## 2015-04-30 NOTE — Telephone Encounter (Signed)
Received PA determination.   PA denied.   Patient must try: Oxybutynin, Toviaz, or Vesicare.

## 2015-04-30 NOTE — Telephone Encounter (Signed)
Prescription sent to pharmacy.   Call placed to patient. LMTRC.  

## 2015-05-01 NOTE — Telephone Encounter (Signed)
Call placed to patient and patient made aware.  

## 2015-05-18 ENCOUNTER — Other Ambulatory Visit: Payer: Self-pay | Admitting: Family Medicine

## 2015-06-18 ENCOUNTER — Other Ambulatory Visit: Payer: Self-pay | Admitting: Family Medicine

## 2015-06-18 NOTE — Telephone Encounter (Signed)
Refill appropriate and filled per protocol. 

## 2015-07-24 ENCOUNTER — Other Ambulatory Visit: Payer: Self-pay | Admitting: Family Medicine

## 2015-07-27 ENCOUNTER — Telehealth: Payer: Self-pay

## 2015-07-27 MED ORDER — DICLOFENAC SODIUM 75 MG PO TBEC: DELAYED_RELEASE_TABLET | ORAL | Status: DC

## 2015-07-27 NOTE — Telephone Encounter (Signed)
Patient called requesting something for arthritis.  Advised patient she should have refills at [facility] for voltaren.  Patient says she is not aware of this.  Advised patient I would call [facility] and get this filled for her.  Also scheduled appointment for patient to follow up on other issues.  Per [facility] they did not have voltaren refills on file.  Approved refills and asked them to fill it.

## 2015-08-02 ENCOUNTER — Encounter: Payer: Self-pay | Admitting: Family Medicine

## 2015-08-02 ENCOUNTER — Ambulatory Visit (INDEPENDENT_AMBULATORY_CARE_PROVIDER_SITE_OTHER): Payer: Medicare Other | Admitting: Family Medicine

## 2015-08-02 VITALS — BP 160/84 | HR 78 | Temp 97.7°F | Resp 16 | Ht 65.0 in | Wt 180.0 lb

## 2015-08-02 DIAGNOSIS — IMO0001 Reserved for inherently not codable concepts without codable children: Secondary | ICD-10-CM

## 2015-08-02 DIAGNOSIS — E1165 Type 2 diabetes mellitus with hyperglycemia: Secondary | ICD-10-CM

## 2015-08-02 DIAGNOSIS — R030 Elevated blood-pressure reading, without diagnosis of hypertension: Secondary | ICD-10-CM | POA: Diagnosis not present

## 2015-08-02 DIAGNOSIS — M542 Cervicalgia: Secondary | ICD-10-CM

## 2015-08-02 MED ORDER — SOLIFENACIN SUCCINATE 10 MG PO TABS: 10.0000 mg | ORAL_TABLET | Freq: Every day | ORAL | Status: DC

## 2015-08-02 MED ORDER — CELECOXIB 200 MG PO CAPS
200.0000 mg | ORAL_CAPSULE | Freq: Two times a day (BID) | ORAL | Status: DC
Start: 2015-08-02 — End: 2015-08-07

## 2015-08-02 NOTE — Progress Notes (Signed)
Subjective:    Patient ID: Kathleen Ramirez, female    DOB: 1945/04/24, 70 y.o.   MRN: [id number]  HPI Patient is here today asking me to refill her diclofenac as well as her Vesicare. She takes diclofenac for arthritis pain in her neck. She takes it once a day and sometimes twice a day. We had a long discussion today about the possible GI side effects including peptic ulcer disease. However she states that she needs something on a daily basis to help manage her arthritic pain. She also requests a refill Vesicare 10 mg a day which she takes for overactive bladder. I explained to the patient she is a diabetic. In 2015 she had a hemoglobin A1c of 6.5. She is not allowed [place] to recheck that since. She is not checking her blood sugars so we have no idea how well controlled her diabetes is. She admits to eating a lot of fruit and drinking sweet tea and eating a lot of sugar. She also smokes one pack of cigarettes a day and her blood pressure today is extremely high. She is convinced that her blood pressure being high is a fluke. She states that her blood pressure is low at home. However at her last office visit her blood pressure was also high at 150/88. I will admit that time before that it was good however I have 2 elevated blood pressure readings in the last 2 visits compounded with the fact she is an uncontrolled diabetic and also a smoker Past Medical History  Diagnosis Date  . Cancer ([place])   . Cervical cancer ([place])   . Tumors     5tumors removed  . Bilateral ovarian tumors   . Dysplastic colon polyp   . Yeast vaginitis   . UTI (lower urinary tract infection)   . Diabetes mellitus without complication [facility])    Past Surgical History  Procedure Laterality Date  . Abdominal hysterectomy    . Tubal ligation    . Tonsillectomy    . Bladder surgery    . Breast lumpectomy    . Tumor excision      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  . Oophrectomy    . Bladder neck suspension    .  Appendectomy    . Orif ulnar fracture  10/19/2011    Procedure: OPEN REDUCTION INTERNAL FIXATION (ORIF) ULNAR FRACTURE;  Surgeon: Linna Hoff, MD;  Location: [place];  Service: Orthopedics;  Laterality: Right;   Current Outpatient Prescriptions on File Prior to Visit  Medication Sig Dispense Refill  . diclofenac (VOLTAREN) 75 MG EC tablet TAKE 1 TABLET(75 MG) BY MOUTH TWICE DAILY 90 tablet 0   No current facility-administered medications on file prior to visit.   Allergies  Allergen Reactions  . Codeine Nausea And Vomiting     makes heart race   Social History   Social History  . Marital Status: Single    Spouse Name: N/A  . Number of Children: N/A  . Years of Education: N/A   Occupational History  . Not on file.   Social History Main Topics  . Smoking status: Current Every Day Smoker -- 0.50 packs/day    Types: Cigarettes  . Smokeless tobacco: Never Used  . Alcohol Use: Yes     Comment: occasionally  . Drug Use: No  . Sexual Activity: Yes     Comment: single female partner   Other Topics Concern  . Not on file   Social History Narrative  Review of Systems  All other systems reviewed and are negative.      Objective:   Physical Exam  Constitutional: She appears well-developed and well-nourished.  Cardiovascular: Normal rate, regular rhythm and normal heart sounds.   Pulmonary/Chest: Effort normal and breath sounds normal. No respiratory distress. She has no wheezes. She has no rales.  Vitals reviewed.         Assessment & Plan:  Type 2 diabetes mellitus with hyperglycemia, without long-term current use of insulin ([place]) - Plan: CBC with Differential/Platelet, COMPLETE METABOLIC PANEL WITH GFR, Lipid panel, Microalbumin, urine, Hemoglobin A1c  Elevated BP - Plan: CBC with Differential/Platelet, COMPLETE METABOLIC PANEL WITH GFR, Lipid panel, Microalbumin, urine, Hemoglobin A1c  Neck pain on left side  I spent 20 minutes today discussing the risk  factors this patient had. I explain his cardiovascular risk given her diabetes, her smoking, and her high blood pressure. She does not want to take medicine for her blood pressure but she will agree to get a blood pressure cuff and start checking her blood pressure at home and if her blood pressures consistently greater than 140/90 will allow me to treat it at that time. She is convinced that this value is a fluke. We also discussed strategies for smoking cessation and I recommended the nicotine patches. We also discussed diabetes and I explained the patient needs to consume a low carbohydrate diet. I would like her to come in fasting so that I can check a CBC, CMP, fasting lipid panel, hemoglobin A1c, and urine microalbumin. She will come in fasting in the morning for this. I recommended that we switch her medicine to Celebrex and so diclofenac given the fact she takes it on a daily basis to avoid or at least reduce the risk of gastrointestinal toxicity. I did refill her Vesicare.

## 2015-08-06 ENCOUNTER — Telehealth: Payer: Self-pay | Admitting: *Deleted

## 2015-08-06 ENCOUNTER — Other Ambulatory Visit: Payer: Medicare Other

## 2015-08-06 DIAGNOSIS — E785 Hyperlipidemia, unspecified: Secondary | ICD-10-CM

## 2015-08-06 DIAGNOSIS — N390 Urinary tract infection, site not specified: Secondary | ICD-10-CM

## 2015-08-06 DIAGNOSIS — E119 Type 2 diabetes mellitus without complications: Secondary | ICD-10-CM | POA: Diagnosis not present

## 2015-08-06 LAB — CBC WITH DIFFERENTIAL/PLATELET
BASOS ABS: 84 {cells}/uL (ref 0–200)
Basophils Relative: 1 %
EOS ABS: 252 {cells}/uL (ref 15–500)
EOS PCT: 3 %
HEMATOCRIT: 45.6 % — AB (ref 35.0–45.0)
HEMOGLOBIN: 15.1 g/dL — AB (ref 12.0–15.0)
LYMPHS ABS: 3276 {cells}/uL (ref 850–3900)
Lymphocytes Relative: 39 %
MCH: 29.8 pg (ref 27.0–33.0)
MCHC: 33.1 g/dL (ref 32.0–36.0)
MCV: 89.9 fL (ref 80.0–100.0)
MPV: 10.7 fL (ref 7.5–12.5)
Monocytes Absolute: 756 cells/uL (ref 200–950)
Monocytes Relative: 9 %
NEUTROS PCT: 48 %
Neutro Abs: 4032 cells/uL (ref 1500–7800)
Platelets: 200 10*3/uL (ref 140–400)
RBC: 5.07 MIL/uL (ref 3.80–5.10)
RDW: 13.6 % (ref 11.0–15.0)
WBC: 8.4 10*3/uL (ref 3.8–10.8)

## 2015-08-06 LAB — LIPID PANEL
Cholesterol: 221 mg/dL — ABNORMAL HIGH (ref 125–200)
HDL: 48 mg/dL (ref >=46)
LDL CALC: 132 mg/dL — AB (ref <130)
Total CHOL/HDL Ratio: 4.6 Ratio (ref <=5.0)
Triglycerides: 205 mg/dL — ABNORMAL HIGH (ref <150)
VLDL: 41 mg/dL — ABNORMAL HIGH (ref <30)

## 2015-08-06 LAB — COMPLETE METABOLIC PANEL WITH GFR
ALBUMIN: 4.2 g/dL (ref 3.6–5.1)
ALT: 23 U/L (ref 6–29)
AST: 18 U/L (ref 10–35)
Alkaline Phosphatase: 46 U/L (ref 33–130)
BILIRUBIN TOTAL: 0.3 mg/dL (ref 0.2–1.2)
BUN: 20 mg/dL (ref 7–25)
CALCIUM: 9.9 mg/dL (ref 8.6–10.4)
CO2: 26 mmol/L (ref 20–31)
Chloride: 104 mmol/L (ref 98–110)
Creat: 1.2 mg/dL — ABNORMAL HIGH (ref 0.50–0.99)
GFR, EST AFRICAN AMERICAN: 54 mL/min — AB (ref >=60)
GFR, EST NON AFRICAN AMERICAN: 47 mL/min — AB (ref >=60)
Glucose, Bld: 161 mg/dL — ABNORMAL HIGH (ref 70–99)
POTASSIUM: 4.7 mmol/L (ref 3.5–5.3)
SODIUM: 141 mmol/L (ref 135–146)
Total Protein: 6.5 g/dL (ref 6.1–8.1)

## 2015-08-06 LAB — HEMOGLOBIN A1C
HEMOGLOBIN A1C: 7.3 % — AB (ref <5.7)
MEAN PLASMA GLUCOSE: 163 mg/dL

## 2015-08-06 NOTE — Telephone Encounter (Signed)
Received PA determination.   PA approved 08/06/2015 -08/05/2016.  Call placed to pharmacy to make aware.

## 2015-08-06 NOTE — Telephone Encounter (Signed)
Received request from pharmacy for PA on Celebrex.   She takes medication on a daily basis. She was switched to Celebrex to avoid or at least reduce the risk of gastrointestinal toxicity.  PA submitted.   Dx: Cervicalgia (M54.2)

## 2015-08-07 ENCOUNTER — Other Ambulatory Visit: Payer: Self-pay | Admitting: Family Medicine

## 2015-08-07 ENCOUNTER — Encounter: Payer: Self-pay | Admitting: Family Medicine

## 2015-08-13 MED ORDER — ATORVASTATIN CALCIUM 20 MG PO TABS: 20.0000 mg | ORAL_TABLET | Freq: Every day | ORAL | Status: DC

## 2015-08-13 MED ORDER — SITAGLIPTIN PHOSPHATE 100 MG PO TABS: 100.0000 mg | ORAL_TABLET | Freq: Every day | ORAL | Status: DC

## 2015-08-19 ENCOUNTER — Ambulatory Visit (HOSPITAL_COMMUNITY)
Admission: EM | Admit: 2015-08-19 | Discharge: 2015-08-19 | Disposition: A | Discharge status: Home / Self Care | Payer: Medicare Other | Attending: Family Medicine | Admitting: Family Medicine

## 2015-08-19 ENCOUNTER — Encounter (HOSPITAL_COMMUNITY): Payer: Self-pay | Admitting: *Deleted

## 2015-08-19 DIAGNOSIS — H6121 Impacted cerumen, right ear: Secondary | ICD-10-CM

## 2015-08-19 DIAGNOSIS — H6091 Unspecified otitis externa, right ear: Secondary | ICD-10-CM

## 2015-08-19 MED ORDER — IBUPROFEN 800 MG PO TABS
800.0000 mg | ORAL_TABLET | Freq: Once | ORAL | Status: AC
  Administered 2015-08-19: 800 mg via ORAL

## 2015-08-19 MED ORDER — ANTIPYRINE-BENZOCAINE 5.4-1.4 % OT SOLN: 3.0000 [drp] | Freq: Three times a day (TID) | OTIC | Status: DC | PRN

## 2015-08-19 MED ORDER — IBUPROFEN 800 MG PO TABS
ORAL_TABLET | ORAL | Status: AC
  Filled 2015-08-19: qty 1

## 2015-08-19 NOTE — Discharge Instructions (Signed)
Take the drops as directed. If you're still feeling pain, please follow up with Dr. Dennard Schaumann.   Otitis Externa Otitis externa is a bacterial or fungal infection of the outer ear canal. This is the area from the eardrum to the outside of the ear. Otitis externa is sometimes called "swimmer's ear." CAUSES  Possible causes of infection include:  Swimming in dirty water.  Moisture remaining in the ear after swimming or bathing.  Mild injury (trauma) to the ear.  Objects stuck in the ear (foreign body).  Cuts or scrapes (abrasions) on the outside of the ear. SIGNS AND SYMPTOMS  The first symptom of infection is often itching in the ear canal. Later signs and symptoms may include swelling and redness of the ear canal, ear pain, and yellowish-white fluid (pus) coming from the ear. The ear pain may be worse when pulling on the earlobe. DIAGNOSIS  Your health care provider will perform a physical exam. A sample of fluid may be taken from the ear and examined for bacteria or fungi. TREATMENT  Antibiotic ear drops are often given for 10 to 14 days. Treatment may also include pain medicine or corticosteroids to reduce itching and swelling. HOME CARE INSTRUCTIONS   Apply antibiotic ear drops to the ear canal as prescribed by your health care provider.  Take medicines only as directed by your health care provider.  If you have diabetes, follow any additional treatment instructions from your health care provider.  Keep all follow-up visits as directed by your health care provider. PREVENTION   Keep your ear dry. Use the corner of a towel to absorb water out of the ear canal after swimming or bathing.  Avoid scratching or putting objects inside your ear. This can damage the ear canal or remove the protective wax that lines the canal. This makes it easier for bacteria and fungi to grow.  Avoid swimming in lakes, polluted water, or poorly chlorinated pools.  You may use ear drops made of rubbing  alcohol and vinegar after swimming. Combine equal parts of white vinegar and alcohol in a bottle. Put 3 or 4 drops into each ear after swimming. SEEK MEDICAL CARE IF:   You have a fever.  Your ear is still red, swollen, painful, or draining pus after 3 days.  Your redness, swelling, or pain gets worse.  You have a severe headache.  You have redness, swelling, pain, or tenderness in the area behind your ear. MAKE SURE YOU:   Understand these instructions.  Will watch your condition.  Will get help right away if you are not doing well or get worse.   This information is not intended to replace advice given to you by your health care provider. Make sure you discuss any questions you have with your health care provider.   Document Released: 02/03/2005 Document Revised: 02/24/2014 Document Reviewed: 02/20/2011 Elsevier Interactive Patient Education [employer].

## 2015-08-19 NOTE — ED Notes (Signed)
C/O severe right ear pain since yesterday.  Has been instilling expired Rx for ofloxacin 0.3% gtts, but reports ear pain getting worse.  Denies fevers.

## 2015-08-19 NOTE — ED Provider Notes (Signed)
CSN: [id number]     Arrival date & time 08/19/15  1914 History   First MD Initiated Contact with Patient 08/19/15 1930     Chief Complaint  Patient presents with  . Otalgia   HPI Kathleen Ramirez is a 70 y.o. female with a history of DM, and cervical cancer presenting for ear pain.   She reports waking up from a nap on the couch yesterday afternoon with severe, sharp, paroxysmal pain in the right ear that does not radiate, is intermittent. It has not improved with ofloxacin drops. She's had a prior episode of similar pain for which she was prescribed multiple drops from this urgent care and her PCP which ultimately caused remission of pain after about 1 week. She denies water submersion, fever, ear fullness, dizziness, tinnitus, or trouble hearing.   Past Medical History  Diagnosis Date  . Cancer ([place])   . Cervical cancer ([place])   . Tumors     5tumors removed  . Bilateral ovarian tumors   . Dysplastic colon polyp   . Yeast vaginitis   . UTI (lower urinary tract infection)   . Diabetes mellitus without complication ([place])   . HLD (hyperlipidemia)   . Smoking    Past Surgical History  Procedure Laterality Date  . Abdominal hysterectomy    . Tubal ligation    . Tonsillectomy    . Bladder surgery    . Breast lumpectomy    . Tumor excision      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  . Oophrectomy    . Bladder neck suspension    . Appendectomy    . Orif ulnar fracture  10/19/2011    Procedure: OPEN REDUCTION INTERNAL FIXATION (ORIF) ULNAR FRACTURE;  Surgeon: Linna Hoff, MD;  Location: [place];  Service: Orthopedics;  Laterality: Right;   No family history on file. Social History  Substance Use Topics  . Smoking status: Current Every Day Smoker -- 0.00 packs/day    Types: Cigarettes  . Smokeless tobacco: Never Used  . Alcohol Use: Yes     Comment: occasionally   OB History    No data available     Review of Systems: as above  Allergies  Codeine  Home Medications    Prior to Admission medications   Medication Sig Start Date End Date Taking? Authorizing Provider  atorvastatin (LIPITOR) 20 MG tablet Take 1 tablet (20 mg total) by mouth daily. 08/13/15  Yes Susy Frizzle, MD  diclofenac (VOLTAREN) 75 MG EC tablet TAKE 1 TABLET(75 MG) BY MOUTH TWICE DAILY 07/27/15  Yes Susy Frizzle, MD  sitaGLIPtin (JANUVIA) 100 MG tablet Take 1 tablet (100 mg total) by mouth daily. 08/13/15  Yes Susy Frizzle, MD  antipyrine-benzocaine Toniann Fail) otic solution Place 3-4 drops into the right ear 3 (three) times daily as needed for ear pain. 08/19/15   Patrecia Pour, MD  solifenacin (VESICARE) 10 MG tablet Take 1 tablet (10 mg total) by mouth daily. Patient taking differently: Take 10 mg by mouth daily. Will start medicine soon 08/02/15   Susy Frizzle, MD   Meds Ordered and Administered this Visit   Medications  ibuprofen (ADVIL,MOTRIN) tablet 800 mg (800 mg Oral Given 08/19/15 1939)   BP 149/87 mmHg  Pulse 74  Temp(Src) 97.8 F (36.6 C) (Oral)  Resp 16  SpO2 95% No data found.  Physical Exam  Constitutional: She is oriented to person, place, and time. She appears well-developed and well-nourished. No distress.  HENT:  R TM wnl with small hard fragment of yellow cerumen abutted against inferior TM. No canal abnormalities. L TM wnl with normal canal. No tenderness to palpation of the mastoid and no TMJ dysfunction by palpation.   Eyes: Conjunctivae and EOM are normal. Pupils are equal, round, and reactive to light. No scleral icterus.  Neck: Neck supple. No JVD present.  Cardiovascular: Normal rate, regular rhythm, normal heart sounds and intact distal pulses.   No murmur heard. Pulmonary/Chest: Effort normal and breath sounds normal. No respiratory distress.  Abdominal: Soft. Bowel sounds are normal. She exhibits no distension. There is no tenderness.  Musculoskeletal: Normal range of motion. She exhibits no edema or tenderness.  Lymphadenopathy:    She has  no cervical adenopathy.  Neurological: She is alert and oriented to person, place, and time. She exhibits normal muscle tone.  Skin: Skin is warm and dry.  Vitals reviewed.  ED Course  Procedures (including critical care time)  Labs Review Labs Reviewed - No data to display  Imaging Review No results found.  MDM   1. Cerumen debris on tympanic membrane, right   2. Otitis externa, acute, right     70 y.o. female with a history of cervical cancer presenting with paroxysmal severe, lancinating pain on the right ear. Ear wax removed by irrigation with improvement in pain and some residual erythema in ear canal. No evidence of otitis externa or otitis media. Will Rx aurgalgan gtts for canal inflammation. Long discussion with patient about seeking PCP follow up if the pain continues/returns or worsens as the characteristics of this complaint are consistent also with trigeminal neuralgia. If symptoms continue, would consider trial of carbamazepine and MRI brain.     Patrecia Pour, MD 08/19/15 2038

## 2015-08-22 ENCOUNTER — Encounter: Payer: Self-pay | Admitting: Physician Assistant

## 2015-08-22 ENCOUNTER — Ambulatory Visit (INDEPENDENT_AMBULATORY_CARE_PROVIDER_SITE_OTHER): Payer: Medicare Other | Admitting: Physician Assistant

## 2015-08-22 VITALS — BP 132/64 | Temp 98.2°F | Ht 65.0 in | Wt 178.0 lb

## 2015-08-22 DIAGNOSIS — M26621 Arthralgia of right temporomandibular joint: Secondary | ICD-10-CM | POA: Diagnosis not present

## 2015-08-22 DIAGNOSIS — M438X2 Other specified deforming dorsopathies, cervical region: Secondary | ICD-10-CM | POA: Diagnosis not present

## 2015-08-22 DIAGNOSIS — M542 Cervicalgia: Secondary | ICD-10-CM | POA: Diagnosis not present

## 2015-08-22 DIAGNOSIS — M489 Spondylopathy, unspecified: Secondary | ICD-10-CM

## 2015-08-22 MED ORDER — DIAZEPAM 5 MG PO TABS: 5.0000 mg | ORAL_TABLET | Freq: Two times a day (BID) | ORAL | Status: DC | PRN

## 2015-08-22 MED ORDER — DICLOFENAC SODIUM 75 MG PO TBEC: DELAYED_RELEASE_TABLET | ORAL | Status: DC

## 2015-08-22 NOTE — Progress Notes (Signed)
Patient ID: Kathleen Ramirez MRN: [id number], DOB: 10-Sep-1945, 70 y.o. Date of Encounter: @DATE @  Chief Complaint:  Chief Complaint  Patient presents with  . right ear pain    started saturday, seen by [facility] on sunday     HPI: 70 y.o. year old white female  presents with above.   Patient states that she was seen at the urgent care for this problem. Says they prescribed a medicine but when she went to [facility] they said that they did not have that medicine in stock and they called [facility] and they said they also did not have that medicine in stock. Says that after that-- both the pharmacy and the patient tried to call the doctor at urgent care but never got any response. Says after that the pharmacy told the patient that that medication is no longer produced at all. As well patient never heard any follow-up from the urgent care. Nonetheless, she never got any Rx.  Still having pain in the right ear.  Reviewed the urgent care note dated 08/19/15. Note stated "she reports waking up from a nap on the couch yesterday afternoon with severe sharp paroxysmal pain in the right ear that does not radiate and is intermittent. On exam they saw a small hard fragment of yellow cerumen abutted against the inferior TM. No canal abnormalities. Irrigated the ear and removed wax. Prescribed Auralgan.  Palpated different areas around the ear. She reports severe tenderness with palpation at the right TM joint and also severe tenderness of the  right anterior neck just a lot low the right jaw region. She grimaces when I palpate these 2 areas.  She does not grimace or indicate pain and I palpate other areas on the right. As well I then palpated the same areas on the left and she does not grimace and even says that there is no pain there when I palpate the left TM joint in the left neck.    Past Medical History  Diagnosis Date  . Cancer ([place])   . Cervical cancer ([place])   . Tumors     5tumors removed  . Bilateral  ovarian tumors   . Dysplastic colon polyp   . Yeast vaginitis   . UTI (lower urinary tract infection)   . Diabetes mellitus without complication ([place])   . HLD (hyperlipidemia)   . Smoking      Home Meds: Outpatient Prescriptions Prior to Visit  Medication Sig Dispense Refill  . atorvastatin (LIPITOR) 20 MG tablet Take 1 tablet (20 mg total) by mouth daily. 90 tablet 1  . sitaGLIPtin (JANUVIA) 100 MG tablet Take 1 tablet (100 mg total) by mouth daily. 90 tablet 1  . solifenacin (VESICARE) 10 MG tablet Take 1 tablet (10 mg total) by mouth daily. (Patient taking differently: Take 10 mg by mouth daily. Will start medicine soon) 30 tablet 5  . diclofenac (VOLTAREN) 75 MG EC tablet TAKE 1 TABLET(75 MG) BY MOUTH TWICE DAILY 90 tablet 0  . antipyrine-benzocaine (AURALGAN) otic solution Place 3-4 drops into the right ear 3 (three) times daily as needed for ear pain. (Patient not taking: Reported on 08/22/2015) 10 mL 0   No facility-administered medications prior to visit.    Allergies:  Allergies  Allergen Reactions  . Codeine Nausea And Vomiting     makes heart race    Social History   Social History  . Marital Status: Single    Spouse Name: N/A  . Number of Children: N/A  .  Years of Education: N/A   Occupational History  . Not on file.   Social History Main Topics  . Smoking status: Current Every Day Smoker -- 0.00 packs/day    Types: Cigarettes  . Smokeless tobacco: Never Used  . Alcohol Use: Yes     Comment: occasionally  . Drug Use: No  . Sexual Activity: Not on file     Comment: single female partner   Other Topics Concern  . Not on file   Social History Narrative    No family history on file.   Review of Systems:  See HPI for pertinent ROS. All other ROS negative.    Physical Exam: Blood pressure 132/64, temperature 98.2 F (36.8 C), temperature source Oral, height 5\' 5"  (1.651 m), weight 178 lb (80.74 kg)., Body mass index is 29.62 kg/(m^2). General: Female.   Appears in no acute distress. Head: Normocephalic, atraumatic, eyes without discharge, sclera non-icteric, nares are without discharge. Bilateral auditory canals clear, TM's are without perforation, pearly grey and translucent with reflective cone of light bilaterally. Oral cavity moist, posterior pharynx without exudate, erythema. Palpated different areas around the ear. She reports severe tenderness with palpation at the right TM joint and also severe tenderness of the  right anterior neck just a lot low the right jaw region. She grimaces when I palpate these 2 areas.  She does not grimace or indicate pain and I palpate other areas on the right. As well I then palpated the same areas on the left and she does not grimace and even says that there is no pain there when I palpate the left TM joint in the left neck. There are no palpable/enlarged lymph nodes at area of tenderness. Her tenderness/pain follow path of muscles.   Neck: Supple. No thyromegaly. No lymphadenopathy. Lungs: Clear bilaterally to auscultation without wheezes, rales, or rhonchi. Breathing is unlabored. Heart: RRR with S1 S2. No murmurs, rubs, or gallops. Musculoskeletal:  Strength and tone normal for age. Extremities/Skin: Warm and dry.  Neuro: Alert and oriented X 3. Moves all extremities spontaneously. Gait is normal. CNII-XII grossly in tact. Psych:  Responds to questions appropriately with a normal affect.     ASSESSMENT AND PLAN:  70 y.o. year old female with  1. Arthralgia of right temporomandibular joint  I reviewed her chart. Back in 2015 she had 2 separate urgent care visits. At one of the urgent care visits she was treated for otitis media. At the other urgent care visit she was treated for otitis externa. After those 2 visits she had follow-up with Dr. Dennard Schaumann at which time he treated her for TMJ. Noted that she does grind her teeth when she sleeps.  Also reviewed her chart -- 09/05/14 she had severe cervical disc  disease.  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 Discussed that all of this can be secondary to combination of her cervical disc disease, muscle tension, grinding teeth. She is to take the diclofenac twice daily with food until this pain resolves. She is to use the Valium twice a day until this pain resolves. Discussed resting these muscles. She is to either lie down or sit in a recliner that supports her head and neck. She is to avoid any upper extremity activity lifting etc. She is to eat soft foods. Avoid hard crunchy foods. She is to take small bites and avoid opening her mouth for large bites. Follow-up  if the symptoms worsen or change or are not improved in 2 weeks.  If pain/symptoms not resolved in 2 weeks, needs f/u with Spine specialist regarding Cervical Disc Disease.   - diclofenac (VOLTAREN) 75 MG EC tablet; TAKE 1 TABLET(75 MG) BY MOUTH TWICE DAILY  Dispense: 90 tablet; Refill: 0 - diazepam (VALIUM) 5 MG tablet; Take 1 tablet (5 mg total) by mouth every 12 (twelve) hours as needed for anxiety.  Dispense: 30 tablet; Refill: 0  2. Neck pain - diclofenac (VOLTAREN) 75 MG EC tablet; TAKE 1 TABLET(75 MG) BY MOUTH TWICE DAILY  Dispense: 90 tablet; Refill: 0 - diazepam (VALIUM) 5 MG tablet; Take 1 tablet (5 mg total) by mouth every 12 (twelve) hours as needed for anxiety.  Dispense: 30 tablet; Refill: 0  3. Cervical spine disease - diclofenac (VOLTAREN) 75 MG EC tablet; TAKE 1 TABLET(75 MG) BY MOUTH TWICE DAILY  Dispense: 90 tablet; Refill: 0 - diazepam (VALIUM) 5 MG tablet; Take 1 tablet (5 mg total) by mouth every 12 (twelve) hours as needed for anxiety.  Dispense: 30 tablet; Refill: 0   Signed, [address] [place], [place], [facility] 08/22/2015 2:02 PM

## 2015-08-23 ENCOUNTER — Telehealth: Payer: Self-pay | Admitting: *Deleted

## 2015-08-23 NOTE — Telephone Encounter (Signed)
Received request from pharmacy for PA on Diazepam.   PA submitted.   Dx: TMJ.

## 2015-08-24 NOTE — Telephone Encounter (Signed)
Received PA determination.   PA approved 08/23/2015- 08/22/2016.  Pharmacy made aware.

## 2015-09-16 ENCOUNTER — Other Ambulatory Visit: Payer: Self-pay | Admitting: Family Medicine

## 2015-09-17 ENCOUNTER — Ambulatory Visit (INDEPENDENT_AMBULATORY_CARE_PROVIDER_SITE_OTHER): Payer: Medicare Other | Admitting: Family Medicine

## 2015-09-17 ENCOUNTER — Encounter: Payer: Self-pay | Admitting: Family Medicine

## 2015-09-17 VITALS — BP 138/78 | HR 60 | Temp 97.5°F | Resp 16 | Ht 65.0 in | Wt 178.0 lb

## 2015-09-17 DIAGNOSIS — K5909 Other constipation: Secondary | ICD-10-CM

## 2015-09-17 DIAGNOSIS — F411 Generalized anxiety disorder: Secondary | ICD-10-CM

## 2015-09-17 DIAGNOSIS — G479 Sleep disorder, unspecified: Secondary | ICD-10-CM

## 2015-09-17 DIAGNOSIS — R5383 Other fatigue: Secondary | ICD-10-CM | POA: Diagnosis not present

## 2015-09-17 DIAGNOSIS — K5900 Constipation, unspecified: Secondary | ICD-10-CM | POA: Diagnosis not present

## 2015-09-17 DIAGNOSIS — K921 Melena: Secondary | ICD-10-CM | POA: Diagnosis not present

## 2015-09-17 NOTE — Progress Notes (Signed)
Subjective:    Patient ID: Kathleen Ramirez, female    DOB: 05/17/1945, 69 y.o.   MRN: [id number]  HPI Patient's history is difficult to follow. She starts by telling me that she is extremely tired all the time. She never has any energy. However the more she talks, she states that she is more sleepy and tired. She believes she is sleepy because she is waking up 5 times a night to go to urinate. She is tried [employer] and some other medications that have helped a little bit but not substantially. She believes this is what is keeping her awake at night and making her so tired. However then she discusses her chronic constipation that has been present for years. She states that she has one bowel movement a week sometimes less. This causes intestinal spasms and pain. On 3 separate occasions she has had hematochezia with "blowout" diarrhea. Sometimes the blood is so heavy that she will defecate silver dollar size clots. Her last colonoscopy was in 2003 by Dr. Henrene Pastor. She has a history significant for numerous colon polyps. She is long overdue for repeat colonoscopy. This has her extremely anxious that she is worried that she could have cancer. I believe this could be affecting her sleep as well. Past Medical History:  Diagnosis Date  . Bilateral ovarian tumors   . Cancer ([place])   . Cervical cancer ([place])   . Diabetes mellitus without complication ([place])   . Dysplastic colon polyp   . HLD (hyperlipidemia)   . Smoking   . Tumors    5tumors removed  . UTI (lower urinary tract infection)   . Yeast vaginitis    Past Surgical History:  Procedure Laterality Date  . ABDOMINAL HYSTERECTOMY    . APPENDECTOMY    . BLADDER NECK SUSPENSION    . BLADDER SURGERY    . BREAST LUMPECTOMY    . oophrectomy    . ORIF ULNAR FRACTURE  10/19/2011   Procedure: OPEN REDUCTION INTERNAL FIXATION (ORIF) ULNAR FRACTURE;  Surgeon: Linna Hoff, MD;  Location: [facility];  Service: Orthopedics;  Laterality: Right;  . TONSILLECTOMY    .  TUBAL LIGATION    . TUMOR EXCISION     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   Current Outpatient Prescriptions on File Prior to Visit  Medication Sig Dispense Refill  . antipyrine-benzocaine (AURALGAN) otic solution Place 3-4 drops into the right ear 3 (three) times daily as needed for ear pain. 10 mL 0  . atorvastatin (LIPITOR) 20 MG tablet Take 1 tablet (20 mg total) by mouth daily. 90 tablet 1  . diazepam (VALIUM) 5 MG tablet Take 1 tablet (5 mg total) by mouth every 12 (twelve) hours as needed for anxiety. 30 tablet 0  . sitaGLIPtin (JANUVIA) 100 MG tablet Take 1 tablet (100 mg total) by mouth daily. 90 tablet 1  . solifenacin (VESICARE) 10 MG tablet Take 1 tablet (10 mg total) by mouth daily. (Patient taking differently: Take 10 mg by mouth daily. Will start medicine soon) 30 tablet 5   No current facility-administered medications on file prior to visit.    Allergies  Allergen Reactions  . Codeine Nausea And Vomiting     makes heart race   Social History   Social History  . Marital status: Single    Spouse name: N/A  . Number of children: N/A  . Years of education: N/A   Occupational History  . Not on file.   Social History Main Topics  .  Smoking status: Current Every Day Smoker    Packs/day: 0.00    Types: Cigarettes  . Smokeless tobacco: Never Used  . Alcohol use Yes     Comment: occasionally  . Drug use: No  . Sexual activity: Not on file     Comment: single female partner   Other Topics Concern  . Not on file   Social History Narrative  . No narrative on file      Review of Systems  All other systems reviewed and are negative.      Objective:   Physical Exam  Constitutional: She appears well-developed and well-nourished.  Cardiovascular: Normal rate, regular rhythm, normal heart sounds and intact distal pulses.   No murmur heard. Pulmonary/Chest: Effort normal and breath sounds normal. No respiratory distress. She has no wheezes. She has no  rales.  Abdominal: Soft. Bowel sounds are normal. She exhibits no distension. There is no tenderness. There is no rebound and no guarding.  Vitals reviewed.         Assessment & Plan:  Hematochezia - Plan: Ambulatory referral to Gastroenterology  Anxiety state  Chronic constipation - Plan: Ambulatory referral to Gastroenterology  Fatigue due to sleep pattern disturbance  I believe the patient's fatigue is multifactorial. I believe the majority is due to sleep disturbance possibly from polyuria as well as anxiety due to her recent diagnosis of diabetes as well as anxiety regarding hematochezia and her fear that she may have colon cancer. I stressed to the patient that we must take this one step at a time. The first thing I recommended is a GI consultation as soon as possible for colonoscopy. I will try treating her chronic constipation with amitiza 24 mcg pobid.  Chronic constipation may even be contributing to some of her polyuria due to the pressure makes her on the bladder. I believe that once we control the constipation, rule out colon cancer, the peace of mind will assist her in sleeping. I believe this will help her fatigue.

## 2015-09-19 ENCOUNTER — Encounter: Payer: Self-pay | Admitting: Internal Medicine

## 2015-11-26 ENCOUNTER — Ambulatory Visit (INDEPENDENT_AMBULATORY_CARE_PROVIDER_SITE_OTHER): Payer: Medicare Other | Admitting: Internal Medicine

## 2015-11-26 ENCOUNTER — Encounter (INDEPENDENT_AMBULATORY_CARE_PROVIDER_SITE_OTHER): Payer: Self-pay

## 2015-11-26 ENCOUNTER — Encounter: Payer: Self-pay | Admitting: Internal Medicine

## 2015-11-26 VITALS — BP 152/82 | HR 64 | Ht 65.0 in | Wt 176.0 lb

## 2015-11-26 DIAGNOSIS — K5909 Other constipation: Secondary | ICD-10-CM | POA: Diagnosis not present

## 2015-11-26 DIAGNOSIS — K625 Hemorrhage of anus and rectum: Secondary | ICD-10-CM

## 2015-11-26 DIAGNOSIS — Z86010 Personal history of colonic polyps: Secondary | ICD-10-CM | POA: Diagnosis not present

## 2015-11-26 DIAGNOSIS — R1084 Generalized abdominal pain: Secondary | ICD-10-CM

## 2015-11-26 MED ORDER — NA SULFATE-K SULFATE-MG SULF 17.5-3.13-1.6 GM/177ML PO SOLN: 1.0000 | Freq: Once | ORAL | 0 refills | Status: AC

## 2015-11-26 NOTE — Progress Notes (Signed)
HISTORY OF PRESENT ILLNESS:  Kathleen Ramirez is a 70 y.o. female with chronic tobacco abuse who is referred today by her primary provider regarding problems with constipation, abdominal pain, and rectal bleeding. Patient has a history of adenomatous colon polyps with advanced adenomas in 2001. Follow-up 2003 with tubular adenomas. Follow-up in 3 years recommended. The patient has been lost to follow-up citing lack of health insurance. Any event, reports a one-year history of problems with constipation associated with abdominal cramping. My move her bowels once or twice per week. Has had intermittent bleeding as manifested by clots. Particular nose above with difficult bowel movements. No weight loss. Continues to smoke. Underwent urology evaluation within the past year for hematuria. Essentially negative CT. Given Amitiza samples for constipation. Resulted in unacceptably loose stool. Requests colonoscopy  REVIEW OF SYSTEMS:  All non-GI ROS negative except for hematuria, excessive urination  Past Medical History:  Diagnosis Date  . Bilateral ovarian tumors   . Cancer ([place])   . Cervical cancer ([place])   . Diabetes mellitus without complication ([place])   . Dysplastic colon polyp   . HLD (hyperlipidemia)   . Smoking   . Tumors    5tumors removed  . UTI (lower urinary tract infection)   . Yeast vaginitis     Past Surgical History:  Procedure Laterality Date  . ABDOMINAL HYSTERECTOMY    . APPENDECTOMY    . BLADDER NECK SUSPENSION    . BLADDER SURGERY    . BREAST LUMPECTOMY    . oophrectomy    . ORIF ULNAR FRACTURE  10/19/2011   Procedure: OPEN REDUCTION INTERNAL FIXATION (ORIF) ULNAR FRACTURE;  Surgeon: Linna Hoff, MD;  Location: [facility];  Service: Orthopedics;  Laterality: Right;  . TONSILLECTOMY    . TUBAL LIGATION    . TUMOR EXCISION     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    Social History Kathleen Ramirez  reports that she has been smoking Cigarettes.  She has been smoking  about 0.00 packs per day. She has never used smokeless tobacco. She reports that she drinks alcohol. She reports that she does not use drugs.  family history includes Heart disease in her mother.  Allergies  Allergen Reactions  . Codeine Nausea And Vomiting     makes heart race       PHYSICAL EXAMINATION: Vital signs: BP (!) 152/82 (BP Location: Left Arm, Patient Position: Sitting, Cuff Size: Normal)   Pulse 64   Ht 5\' 5"  (1.651 m)   Wt 176 lb (79.8 kg)   BMI 29.29 kg/m   Constitutional: generally well-appearing, no acute distress Psychiatric: alert and oriented x3, cooperative Eyes: extraocular movements intact, anicteric, conjunctiva pink Mouth: oral pharynx moist, no lesions Neck: supple no lymphadenopathy Cardiovascular: heart regular rate and rhythm, no murmur Lungs: clear to auscultation bilaterally Abdomen: soft,Obese, nontender, nondistended, no obvious ascites, no peritoneal signs, normal bowel sounds, no organomegaly Rectal: Deferred until colonoscopy Extremities: no lower extremity edema bilaterally Skin: Wrinkled skin. no lesions on visible extremities Neuro: No focal deficits. Normal DTRs  ASSESSMENT:  #1. One-year history of constipation. #2. Intermittent rectal bleeding of uncertain cause #3. Abdominal pain. Rule out secondary to constipation. Rule out neoplasia #4. History of multiple and advanced adenomas. Overdue for follow-up. Previous examinations 2001 and 2003   PLAN:  #1. Colonoscopy.The nature of the procedure, as well as the risks, benefits, and alternatives were carefully and thoroughly reviewed with the patient. Ample time for discussion and questions allowed. The patient understood,  was satisfied, and agreed to proceed. #2. Bowel regimen to be determined after the above results available #3. Stop smoking

## 2015-11-26 NOTE — Patient Instructions (Signed)

## 2015-11-28 ENCOUNTER — Telehealth: Payer: Self-pay | Admitting: Internal Medicine

## 2015-11-28 NOTE — Telephone Encounter (Signed)
Code given to [place] and now prep will be $50.00.  Left patient voice mail stating this.

## 2015-12-05 ENCOUNTER — Encounter: Payer: Self-pay | Admitting: Internal Medicine

## 2015-12-05 ENCOUNTER — Ambulatory Visit (AMBULATORY_SURGERY_CENTER): Payer: Medicare Other | Admitting: Internal Medicine

## 2015-12-05 VITALS — BP 124/60 | HR 63 | Temp 99.3°F | Resp 16 | Ht 65.0 in | Wt 176.0 lb

## 2015-12-05 DIAGNOSIS — D120 Benign neoplasm of cecum: Secondary | ICD-10-CM

## 2015-12-05 DIAGNOSIS — K625 Hemorrhage of anus and rectum: Secondary | ICD-10-CM | POA: Diagnosis not present

## 2015-12-05 DIAGNOSIS — K5730 Diverticulosis of large intestine without perforation or abscess without bleeding: Secondary | ICD-10-CM

## 2015-12-05 DIAGNOSIS — R1084 Generalized abdominal pain: Secondary | ICD-10-CM | POA: Diagnosis not present

## 2015-12-05 DIAGNOSIS — Z86010 Personal history of colonic polyps: Secondary | ICD-10-CM | POA: Diagnosis not present

## 2015-12-05 DIAGNOSIS — Z1211 Encounter for screening for malignant neoplasm of colon: Secondary | ICD-10-CM | POA: Diagnosis not present

## 2015-12-05 DIAGNOSIS — K5909 Other constipation: Secondary | ICD-10-CM

## 2015-12-05 DIAGNOSIS — K648 Other hemorrhoids: Secondary | ICD-10-CM

## 2015-12-05 HISTORY — DX: Diverticulosis of large intestine without perforation or abscess without bleeding: K57.30

## 2015-12-05 HISTORY — DX: Other hemorrhoids: K64.8

## 2015-12-05 HISTORY — PX: COLONOSCOPY W/ POLYPECTOMY: SHX1380

## 2015-12-05 LAB — GLUCOSE, CAPILLARY
Glucose-Capillary: 132 mg/dL — ABNORMAL HIGH (ref 65–99)
Glucose-Capillary: 113 mg/dL — ABNORMAL HIGH (ref 65–99)

## 2015-12-05 MED ORDER — SODIUM CHLORIDE 0.9 % IV SOLN: 500.0000 mL | INTRAVENOUS | Status: DC

## 2015-12-05 NOTE — Op Note (Signed)
[facility] Patient Name: Kathleen Ramirez Procedure Date: 12/05/2015 11:43 AM MRN: [id number] Endoscopist: Docia Chuck. Henrene Pastor , MD Age: 70 Referring MD:  Date of Birth: October 18, 1945 Gender: Female Account #: [id number] Procedure:                Colonoscopy, with cold snare polypectomy x 1 Indications:              Surveillance: Personal history of adenomatous                            polyps on last colonoscopy > 5 years ago, High risk                            colon cancer surveillance: Personal history of                            adenoma (10 mm or greater in size), High risk colon                            cancer surveillance: Personal history of multiple                            (3 or more) adenomas. Previous examinations 2001                            and 2003. Overdue for follow-up. Other complaints                            include abdominal pain, constipation, and rectal                            bleeding Medicines:                Monitored Anesthesia Care Procedure:                Pre-Anesthesia Assessment:                           - Prior to the procedure, a History and Physical                            was performed, and patient medications and                            allergies were reviewed. The patient's tolerance of                            previous anesthesia was also reviewed. The risks                            and benefits of the procedure and the sedation                            options and risks were discussed with the patient.  All questions were answered, and informed consent                            was obtained. Prior Anticoagulants: The patient has                            taken no previous anticoagulant or antiplatelet                            agents. ASA Grade Assessment: II - A patient with                            mild systemic disease. After reviewing the risks                            and benefits,  the patient was deemed in                            satisfactory condition to undergo the procedure.                           After obtaining informed consent, the colonoscope                            was passed under direct vision. Throughout the                            procedure, the patient's blood pressure, pulse, and                            oxygen saturations were monitored continuously. The                            Model CF-HQ190L [id number]) scope was introduced                            through the anus and advanced to the the cecum,                            identified by appendiceal orifice and ileocecal                            valve. The ileocecal valve, appendiceal orifice,                            and rectum were photographed. The quality of the                            bowel preparation was excellent. The colonoscopy                            was performed without difficulty. The patient  tolerated the procedure well. The bowel preparation                            used was SUPREP. Scope In: 11:54:54 AM Scope Out: 12:12:07 PM Scope Withdrawal Time: 0 hours 12 minutes 18 seconds  Total Procedure Duration: 0 hours 17 minutes 13 seconds  Findings:                 A 1 mm polyp was found in the cecum. The polyp was                            removed with a cold snare. Resection was complete.                            No meaningful tissue retrieved for pathologic                            submission.                           Multiple diverticula were found in the sigmoid                            colon.                           Internal hemorrhoids were found during retroflexion.                           The exam was otherwise without abnormality on                            direct and retroflexion views. Complications:            No immediate complications. Estimated blood loss:                            None. Estimated  Blood Loss:     Estimated blood loss: none. Impression:               - One 1 mm polyp in the cecum, removed with a cold                            snare.                           - Diverticulosis in the sigmoid colon.                           - Internal hemorrhoids.                           - The examination was otherwise normal on direct                            and retroflexion views. Recommendation:           - Repeat colonoscopy in  5 years for surveillance.                           - Patient has a contact number available for                            emergencies. The signs and symptoms of potential                            delayed complications were discussed with the                            patient. Return to normal activities tomorrow.                            Written discharge instructions were provided to the                            patient.                           - Resume previous diet.                           - Continue present medications. Docia Chuck. Henrene Pastor, MD 12/05/2015 12:20:21 PM This report has been signed electronically.

## 2015-12-05 NOTE — Progress Notes (Signed)
Called to room to assist during endoscopic procedure.  Patient ID and intended procedure confirmed with present staff. Received instructions for my participation in the procedure from the performing physician.  

## 2015-12-05 NOTE — Progress Notes (Signed)
A and O x3. Report to RN. Tolerated MAC anesthesia well. 

## 2015-12-05 NOTE — Patient Instructions (Signed)
Discharge instructions given. Handouts on polyps,diverticulosis and hemorrhoids. Resume previous medications. YOU HAD AN ENDOSCOPIC PROCEDURE TODAY AT THE [facility]:   Refer to the procedure report that was given to you for any specific questions about what was found during the examination.  If the procedure report does not answer your questions, please call your gastroenterologist to clarify.  If you requested that your care partner not be given the details of your procedure findings, then the procedure report has been included in a sealed envelope for you to review at your convenience later.  YOU SHOULD EXPECT: Some feelings of bloating in the abdomen. Passage of more gas than usual.  Walking can help get rid of the air that was put into your GI tract during the procedure and reduce the bloating. If you had a lower endoscopy (such as a colonoscopy or flexible sigmoidoscopy) you may notice spotting of blood in your stool or on the toilet paper. If you underwent a bowel prep for your procedure, you may not have a normal bowel movement for a few days.  Please Note:  You might notice some irritation and congestion in your nose or some drainage.  This is from the oxygen used during your procedure.  There is no need for concern and it should clear up in a day or so.  SYMPTOMS TO REPORT IMMEDIATELY:   Following lower endoscopy (colonoscopy or flexible sigmoidoscopy):  Excessive amounts of blood in the stool  Significant tenderness or worsening of abdominal pains  Swelling of the abdomen that is new, acute  Fever of 100F or higher   For urgent or emergent issues, a gastroenterologist can be reached at any hour by calling [phone].   DIET:  We do recommend a small meal at first, but then you may proceed to your regular diet.  Drink plenty of fluids but you should avoid alcoholic beverages for 24 hours.  ACTIVITY:  You should plan to take it easy for the rest of today and you  should NOT DRIVE or use heavy machinery until tomorrow (because of the sedation medicines used during the test).    FOLLOW UP: Our staff will call the number listed on your records the next business day following your procedure to check on you and address any questions or concerns that you may have regarding the information given to you following your procedure. If we do not reach you, we will leave a message.  However, if you are feeling well and you are not experiencing any problems, there is no need to return our call.  We will assume that you have returned to your regular daily activities without incident.  If any biopsies were taken you will be contacted by phone or by letter within the next 1-3 weeks.  Please call us at [phone] if you have not heard about the biopsies in 3 weeks.    SIGNATURES/CONFIDENTIALITY: You and/or your care partner have signed paperwork which will be entered into your electronic medical record.  These signatures attest to the fact that that the information above on your After Visit Summary has been reviewed and is understood.  Full responsibility of the confidentiality of this discharge information lies with you and/or your care-partner. 

## 2015-12-06 ENCOUNTER — Telehealth: Payer: Self-pay

## 2015-12-06 NOTE — Telephone Encounter (Signed)
  Follow up Call-  Call back number 12/05/2015  Post procedure Call Back phone  # [phone]  Permission to leave phone message Yes  Some recent data might be hidden     Patient questions:  Do you have a fever, pain , or abdominal swelling? No. Pain Score  0 *  Have you tolerated food without any problems? Yes.    Have you been able to return to your normal activities? Yes.    Do you have any questions about your discharge instructions: Diet   No. Medications  No. Follow up visit  No.  Do you have questions or concerns about your Care? No.  Actions: * If pain score is 4 or above: No action needed, pain <4.

## 2016-01-20 ENCOUNTER — Ambulatory Visit (HOSPITAL_COMMUNITY)
Admission: EM | Admit: 2016-01-20 | Discharge: 2016-01-20 | Disposition: A | Discharge status: Home / Self Care | Payer: Medicare Other | Attending: Emergency Medicine | Admitting: Emergency Medicine

## 2016-01-20 ENCOUNTER — Encounter (HOSPITAL_COMMUNITY): Payer: Self-pay | Admitting: Emergency Medicine

## 2016-01-20 DIAGNOSIS — J069 Acute upper respiratory infection, unspecified: Secondary | ICD-10-CM | POA: Diagnosis not present

## 2016-01-20 DIAGNOSIS — B9789 Other viral agents as the cause of diseases classified elsewhere: Secondary | ICD-10-CM

## 2016-01-20 MED ORDER — LIDOCAINE VISCOUS 2 % MT SOLN
10.0000 mL | OROMUCOSAL | 0 refills | Status: DC | PRN
Start: 2016-01-20 — End: 2016-05-05

## 2016-01-20 MED ORDER — PREDNISONE 20 MG PO TABS: 40.0000 mg | ORAL_TABLET | Freq: Every day | ORAL | 0 refills | Status: DC

## 2016-01-20 MED ORDER — ALBUTEROL SULFATE HFA 108 (90 BASE) MCG/ACT IN AERS: 2.0000 | INHALATION_SPRAY | Freq: Four times a day (QID) | RESPIRATORY_TRACT | 0 refills | Status: DC | PRN

## 2016-01-20 NOTE — ED Triage Notes (Signed)
Friday patient's throat started feeling sore, then cough and congestion, eyes matted.  Denies sinus complaints.  Denies fever.  Chest shore with coughing.  Chest is on fire per patient

## 2016-01-20 NOTE — Discharge Instructions (Signed)
Start Prednisone oral tablets 40mg  daily for 5 days. Use Albuterol inhaler 2 puffs every 6 hours as needed for wheezing. Take Delsym cough medication every 12 hours as needed for cough. Take Lidocaine mouth wash 2 teaspoons swish and spit out every 4 hours as needed for throat pain. Follow-up with your primary care provider in 3 days if not improving.

## 2016-01-20 NOTE — ED Provider Notes (Signed)
CSN: [id number]     Arrival date & time 01/20/16  1204 History   First MD Initiated Contact with Patient 01/20/16 1252     Chief Complaint  Patient presents with  . URI   (Consider location/radiation/quality/duration/timing/severity/associated sxs/prior Treatment) 70 year old female presents with sore throat for the past 2 days. Now experiencing more coughing and chest congestion. Woke up this morning with her eyes matted shut. Denies any fever or nasal congestion. Has been taking Advil with no relief. Has taken Tessalon cough pills in the past without success and requests different cough medication but allergic to codeine. Still smokes and tends to get bronchitis multiple times a year.    The history is provided by the patient.    Past Medical History:  Diagnosis Date  . Bilateral ovarian tumors   . Cancer ([place])   . Cervical cancer ([place])   . Diabetes mellitus without complication ([place])   . Dysplastic colon polyp   . HLD (hyperlipidemia)   . Smoking   . Tumors    5tumors removed  . UTI (lower urinary tract infection)   . Yeast vaginitis    Past Surgical History:  Procedure Laterality Date  . ABDOMINAL HYSTERECTOMY    . APPENDECTOMY    . BLADDER NECK SUSPENSION    . BLADDER SURGERY    . BREAST LUMPECTOMY    . oophrectomy    . ORIF ULNAR FRACTURE  10/19/2011   Procedure: OPEN REDUCTION INTERNAL FIXATION (ORIF) ULNAR FRACTURE;  Surgeon: Linna Hoff, MD;  Location: [facility];  Service: Orthopedics;  Laterality: Right;  . TONSILLECTOMY    . TUBAL LIGATION    . TUMOR EXCISION     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   Family History  Problem Relation Age of Onset  . Heart disease Mother    Social History  Substance Use Topics  . Smoking status: Current Every Day Smoker    Packs/day: 0.00    Types: Cigarettes  . Smokeless tobacco: Never Used  . Alcohol use Yes     Comment: occasionally   OB History    No data available     Review of Systems  Constitutional:  Positive for fatigue. Negative for appetite change, chills and fever.  HENT: Positive for congestion and sore throat. Negative for ear pain, rhinorrhea, sinus pain, sinus pressure and sneezing.   Eyes: Positive for discharge.  Respiratory: Positive for cough, chest tightness, shortness of breath and wheezing.   Cardiovascular: Negative for chest pain.  Musculoskeletal: Negative for neck pain and neck stiffness.  Skin: Negative for rash.  Neurological: Positive for headaches. Negative for dizziness, weakness, light-headedness and numbness.  Hematological: Negative for adenopathy.    Allergies  Codeine  Home Medications   Prior to Admission medications   Medication Sig Start Date End Date Taking? Authorizing Provider  albuterol (PROVENTIL HFA;VENTOLIN HFA) 108 (90 Base) MCG/ACT inhaler Inhale 2 puffs into the lungs every 6 (six) hours as needed for wheezing or shortness of breath. 01/20/16   Katy Apo, NP  atorvastatin (LIPITOR) 20 MG tablet Take 1 tablet (20 mg total) by mouth daily. 08/13/15   Susy Frizzle, MD  celecoxib (CELEBREX) 200 MG capsule TAKE 1 CAPSULE(200 MG) BY MOUTH TWICE DAILY 09/17/15   Susy Frizzle, MD  lidocaine (XYLOCAINE) 2 % solution Use as directed 10 mLs in the mouth or throat every 4 (four) hours as needed for mouth pain. 01/20/16   Katy Apo, NP  predniSONE (DELTASONE)  20 MG tablet Take 2 tablets (40 mg total) by mouth daily. 01/20/16 01/25/16  Katy Apo, NP  sitaGLIPtin (JANUVIA) 100 MG tablet Take 1 tablet (100 mg total) by mouth daily. 08/13/15   Susy Frizzle, MD   Meds Ordered and Administered this Visit  Medications - No data to display  BP 134/80 (BP Location: Left Arm)   Pulse 91   Temp 98.2 F (36.8 C) (Oral)   Resp 22 Comment: coughing  SpO2 99%  No data found.   Physical Exam  Constitutional: She is oriented to person, place, and time. She appears well-developed and well-nourished. She appears ill. No distress.  HENT:   Head: Normocephalic and atraumatic.  Right Ear: Hearing, tympanic membrane, external ear and ear canal normal.  Left Ear: Hearing, tympanic membrane, external ear and ear canal normal.  Nose: Nose normal. Right sinus exhibits no maxillary sinus tenderness and no frontal sinus tenderness. Left sinus exhibits no maxillary sinus tenderness and no frontal sinus tenderness.  Mouth/Throat: Uvula is midline and mucous membranes are normal. Posterior oropharyngeal erythema present.  Eyes: Conjunctivae are normal. Right eye exhibits no discharge.  Neck: Normal range of motion. Neck supple.  Cardiovascular: Normal rate, regular rhythm and normal heart sounds.   Pulmonary/Chest: Effort normal. No respiratory distress. She has decreased breath sounds in the right upper field, the right lower field, the left upper field and the left lower field. She has wheezes in the right upper field and the left upper field. She has no rhonchi. She has no rales.  Lymphadenopathy:    She has no cervical adenopathy.  Neurological: She is alert and oriented to person, place, and time.  Skin: Skin is warm and dry. Capillary refill takes less than 2 seconds.  Psychiatric: She has a normal mood and affect. Her behavior is normal. Judgment and thought content normal.    Urgent Care Course   Clinical Course     Procedures (including critical care time)  Labs Review Labs Reviewed - No data to display  Imaging Review No results found.   Visual Acuity Review  Right Eye Distance:   Left Eye Distance:   Bilateral Distance:    Right Eye Near:   Left Eye Near:    Bilateral Near:         MDM   1. Viral URI with cough    Discussed that she probably has a viral illness. Recommend start Prednisone oral tablets 40mg  daily for 5 days. Use Albuterol inhaler 2 puffs every 6 hours as needed for wheezing. Take OTC Delsym cough medication every 12 hours as needed for cough. Use Lidocaine mouth wash 2 teaspoons swish  and spit out every 4 hours as needed for throat pain. Recommend follow-up with your primary care provider in 3 days if not improving.      Katy Apo, NP 01/21/16 [phone]

## 2016-01-22 ENCOUNTER — Ambulatory Visit (INDEPENDENT_AMBULATORY_CARE_PROVIDER_SITE_OTHER): Payer: Medicare Other | Admitting: Family Medicine

## 2016-01-22 VITALS — BP 136/80 | HR 76 | Temp 98.5°F | Resp 18 | Ht 65.0 in | Wt 177.0 lb

## 2016-01-22 DIAGNOSIS — J441 Chronic obstructive pulmonary disease with (acute) exacerbation: Secondary | ICD-10-CM | POA: Diagnosis not present

## 2016-01-22 MED ORDER — LEVOFLOXACIN 500 MG PO TABS: 500.0000 mg | ORAL_TABLET | Freq: Every day | ORAL | 0 refills | Status: DC

## 2016-01-22 MED ORDER — PREDNISONE 20 MG PO TABS: 60.0000 mg | ORAL_TABLET | Freq: Every day | ORAL | 0 refills | Status: DC

## 2016-01-22 NOTE — Progress Notes (Signed)
Subjective:    Patient ID: Kathleen Ramirez, female    DOB: 06-28-1945, 70 y.o.   MRN: [id number]  HPI  Patient symptoms began at the end of last week. Began with a cough and chest congestion. Patient has a 30+ pack year history of smoking. Patient gradually became more and more short of breath. She is now audibly wheezing. Was seen in urgent care and we can was started on prednisone 40 mg a day. This has not helped at all. She now has a cough productive of white and green sputum. She has increased work of breathing and shortness of breath. Symptoms improved after she was given a DuoNeb today in the office. She denies any fevers or chills or hemoptysis Past Medical History:  Diagnosis Date  . Bilateral ovarian tumors   . Cancer ([place])   . Cervical cancer ([place])   . Diabetes mellitus without complication ([place])   . Dysplastic colon polyp   . HLD (hyperlipidemia)   . Smoking   . Tumors    5tumors removed  . UTI (lower urinary tract infection)   . Yeast vaginitis    Past Surgical History:  Procedure Laterality Date  . ABDOMINAL HYSTERECTOMY    . APPENDECTOMY    . BLADDER NECK SUSPENSION    . BLADDER SURGERY    . BREAST LUMPECTOMY    . oophrectomy    . ORIF ULNAR FRACTURE  10/19/2011   Procedure: OPEN REDUCTION INTERNAL FIXATION (ORIF) ULNAR FRACTURE;  Surgeon: Linna Hoff, MD;  Location: [facility];  Service: Orthopedics;  Laterality: Right;  . TONSILLECTOMY    . TUBAL LIGATION    . TUMOR EXCISION     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   Current Outpatient Prescriptions on File Prior to Visit  Medication Sig Dispense Refill  . albuterol (PROVENTIL HFA;VENTOLIN HFA) 108 (90 Base) MCG/ACT inhaler Inhale 2 puffs into the lungs every 6 (six) hours as needed for wheezing or shortness of breath. 1 Inhaler 0  . atorvastatin (LIPITOR) 20 MG tablet Take 1 tablet (20 mg total) by mouth daily. 90 tablet 1  . celecoxib (CELEBREX) 200 MG capsule TAKE 1 CAPSULE(200 MG) BY MOUTH TWICE DAILY  180 capsule 3  . lidocaine (XYLOCAINE) 2 % solution Use as directed 10 mLs in the mouth or throat every 4 (four) hours as needed for mouth pain. 100 mL 0  . predniSONE (DELTASONE) 20 MG tablet Take 2 tablets (40 mg total) by mouth daily. 10 tablet 0  . sitaGLIPtin (JANUVIA) 100 MG tablet Take 1 tablet (100 mg total) by mouth daily. 90 tablet 1   Current Facility-Administered Medications on File Prior to Visit  Medication Dose Route Frequency Provider Last Rate Last Dose  . 0.9 %  sodium chloride infusion  500 mL Intravenous Continuous Irene Shipper, MD       Allergies  Allergen Reactions  . Codeine Nausea And Vomiting     makes heart race   Social History   Social History  . Marital status: Single    Spouse name: N/A  . Number of children: N/A  . Years of education: N/A   Occupational History  . Not on file.   Social History Main Topics  . Smoking status: Current Every Day Smoker    Packs/day: 0.00    Types: Cigarettes  . Smokeless tobacco: Never Used  . Alcohol use Yes     Comment: occasionally  . Drug use: No  . Sexual activity: Not on file  Comment: single female partner   Other Topics Concern  . Not on file   Social History Narrative  . No narrative on file     Review of Systems  All other systems reviewed and are negative.      Objective:   Physical Exam  Constitutional: She appears well-developed and well-nourished. No distress.  Neck: No JVD present.  Cardiovascular: Normal rate, regular rhythm and normal heart sounds.   Pulmonary/Chest: No respiratory distress. She has decreased breath sounds. She has wheezes. She has no rales.  Musculoskeletal: She exhibits no edema.  Skin: She is not diaphoretic.  Vitals reviewed.         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 I believe she has an exacerbation of such due to her recent infection. Given the sputum production and the significance of  her exam I'll start the patient on Levaquin 500 milligrams once daily for 7 days. Begin prednisone 60 mg a day. Given her diminished breath sounds and increased wheezing, I believe the patient needs a higher dose steroid. Patient was given a DuoNeb today in office. She is to continue with albuterol every 6 hours as needed home. Recheck on Thursday or sooner if worse. Once starting to improve wean down gradually on prednisone

## 2016-01-24 ENCOUNTER — Ambulatory Visit (INDEPENDENT_AMBULATORY_CARE_PROVIDER_SITE_OTHER): Payer: Medicare Other | Admitting: Family Medicine

## 2016-01-24 VITALS — BP 150/92 | HR 72 | Temp 97.9°F | Resp 18 | Ht 65.0 in | Wt 175.0 lb

## 2016-01-24 DIAGNOSIS — J441 Chronic obstructive pulmonary disease with (acute) exacerbation: Secondary | ICD-10-CM

## 2016-01-24 NOTE — Progress Notes (Signed)
Subjective:    Patient ID: Kathleen Ramirez, female    DOB: [birth year], 70 y.o.   MRN: [id number]  HPI 01/22/16 Patient symptoms began at the end of last week. Began with a cough and chest congestion. Patient has a 30+ pack year history of smoking. Patient gradually became more and more short of breath. She is now audibly wheezing. Was seen in urgent care and we can was started on prednisone 40 mg a day. This has not helped at all. She now has a cough productive of white and green sputum. She has increased work of breathing and shortness of breath. Symptoms improved after she was given a DuoNeb today in the office. She denies any fevers or chills or hemoptysis.  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 I believe she has an exacerbation of such due to her recent infection. Given the sputum production and the significance of her exam I'll start the patient on Levaquin 500 milligrams once daily for 7 days. Begin prednisone 60 mg a day. Given her diminished breath sounds and increased wheezing, I believe the patient needs a higher dose steroid. Patient was given a DuoNeb today in office. She is to continue with albuterol every 6 hours as needed home. Recheck on Thursday or sooner if worse. Once starting to improve wean down gradually on prednisone  01/24/16 Patient's breathing is noticeably better. She states she feels better. Air movement has improved. There are no wheezes today on exam. She still has a terrible cough but this is at least 50% better than it was 2 days ago. Past Medical History:  Diagnosis Date  . Bilateral ovarian tumors   . Cancer ([place])   . Cervical cancer ([place])   . Diabetes mellitus without complication ([place])   . Dysplastic colon polyp   . HLD (hyperlipidemia)   . Smoking   . Tumors    5tumors removed  . UTI (lower urinary tract infection)   . Yeast vaginitis    Past Surgical History:  Procedure Laterality Date  . ABDOMINAL HYSTERECTOMY      . APPENDECTOMY    . BLADDER NECK SUSPENSION    . BLADDER SURGERY    . BREAST LUMPECTOMY    . oophrectomy    . ORIF ULNAR FRACTURE  10/19/2011   Procedure: OPEN REDUCTION INTERNAL FIXATION (ORIF) ULNAR FRACTURE;  Surgeon: Linna Hoff, MD;  Location: [facility];  Service: Orthopedics;  Laterality: Right;  . TONSILLECTOMY    . TUBAL LIGATION    . TUMOR EXCISION     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   Current Outpatient Prescriptions on File Prior to Visit  Medication Sig Dispense Refill  . albuterol (PROVENTIL HFA;VENTOLIN HFA) 108 (90 Base) MCG/ACT inhaler Inhale 2 puffs into the lungs every 6 (six) hours as needed for wheezing or shortness of breath. 1 Inhaler 0  . atorvastatin (LIPITOR) 20 MG tablet Take 1 tablet (20 mg total) by mouth daily. 90 tablet 1  . celecoxib (CELEBREX) 200 MG capsule TAKE 1 CAPSULE(200 MG) BY MOUTH TWICE DAILY 180 capsule 3  . levofloxacin (LEVAQUIN) 500 MG tablet Take 1 tablet (500 mg total) by mouth daily. 7 tablet 0  . lidocaine (XYLOCAINE) 2 % solution Use as directed 10 mLs in the mouth or throat every 4 (four) hours as needed for mouth pain. 100 mL 0  . predniSONE (DELTASONE) 20 MG tablet Take 3 tablets (60 mg total) by mouth daily with breakfast. 15 tablet  0  . sitaGLIPtin (JANUVIA) 100 MG tablet Take 1 tablet (100 mg total) by mouth daily. 90 tablet 1   Current Facility-Administered Medications on File Prior to Visit  Medication Dose Route Frequency Provider Last Rate Last Dose  . 0.9 %  sodium chloride infusion  500 mL Intravenous Continuous Irene Shipper, MD       Allergies  Allergen Reactions  . Codeine Nausea And Vomiting     makes heart race   Social History   Social History  . Marital status: Single    Spouse name: N/A  . Number of children: N/A  . Years of education: N/A   Occupational History  . Not on file.   Social History Main Topics  . Smoking status: Current Every Day Smoker    Packs/day: 0.00    Types: Cigarettes   . Smokeless tobacco: Never Used  . Alcohol use Yes     Comment: occasionally  . Drug use: No  . Sexual activity: Not on file     Comment: single female partner   Other Topics Concern  . Not on file   Social History Narrative  . No narrative on file     Review of Systems  All other systems reviewed and are negative.      Objective:   Physical Exam  Constitutional: She appears well-developed and well-nourished. No distress.  Neck: No JVD present.  Cardiovascular: Normal rate, regular rhythm and normal heart sounds.   Pulmonary/Chest: No respiratory distress. She has decreased breath sounds. She has wheezes. She has no rales.  Musculoskeletal: She exhibits no edema.  Skin: She is not diaphoretic.  Vitals reviewed.         Assessment & Plan:  COPD exacerbation ([place]) Clinically improving. Complete prednisone. Complete Levaquin. April back on albuterol to every 6 hours as needed. Spent 10 minutes emphasizing the need for her to refrain from smoking. I certainly believe she has COPD.

## 2016-02-04 ENCOUNTER — Other Ambulatory Visit: Payer: Self-pay | Admitting: Family Medicine

## 2016-04-26 ENCOUNTER — Other Ambulatory Visit: Payer: Self-pay | Admitting: Family Medicine

## 2016-05-05 ENCOUNTER — Ambulatory Visit (INDEPENDENT_AMBULATORY_CARE_PROVIDER_SITE_OTHER): Payer: Medicare Other | Admitting: Physician Assistant

## 2016-05-05 VITALS — BP 148/82 | HR 73 | Temp 98.0°F | Resp 16 | Wt 180.2 lb

## 2016-05-05 DIAGNOSIS — J040 Acute laryngitis: Secondary | ICD-10-CM | POA: Diagnosis not present

## 2016-05-05 DIAGNOSIS — B9789 Other viral agents as the cause of diseases classified elsewhere: Secondary | ICD-10-CM | POA: Diagnosis not present

## 2016-05-05 DIAGNOSIS — J029 Acute pharyngitis, unspecified: Secondary | ICD-10-CM

## 2016-05-05 LAB — STREP GROUP A AG, W/REFLEX TO CULT: STREGTOCOCCUS GROUP A AG SCREEN: NOT DETECTED

## 2016-05-05 NOTE — Progress Notes (Signed)
Patient ID: Kathleen Ramirez MRN: [id number], DOB: 10-Jan-1946, 71 y.o. Date of Encounter: 05/05/2016, 9:58 AM    Chief Complaint:  Chief Complaint  Patient presents with  . Sore Throat    x3days   .      HPI: 71 y.o. year old female presents with above.   Says she has problems with cervical disc and doesn't know how much of her pain and symptoms are coming from that. Says that Friday 04/29/16 was feeling discomfort in her left ear and left neck. Thought that was just because of her cervical disc but then later Friday she started becoming hoarse. Since then, the hoarseness has continued and worsened. Also, over the weekend developed sore throat. Now her throat doesn't feel as sore but it does feel kind of swollen. Also says that she just doesn't feel good-- feels very weak and run down and spent most of the weekend in bed. Says that she is blowing nothing out of her nose and is having no cough. No Fevers or chills.     Home Meds:   Outpatient Medications Prior to Visit  Medication Sig Dispense Refill  . albuterol (PROVENTIL HFA;VENTOLIN HFA) 108 (90 Base) MCG/ACT inhaler Inhale 2 puffs into the lungs every 6 (six) hours as needed for wheezing or shortness of breath. 1 Inhaler 0  . atorvastatin (LIPITOR) 20 MG tablet TAKE 1 TABLET(20 MG) BY MOUTH DAILY 90 tablet 0  . celecoxib (CELEBREX) 200 MG capsule TAKE 1 CAPSULE(200 MG) BY MOUTH TWICE DAILY 180 capsule 3  . JANUVIA 100 MG tablet TAKE 1 TABLET(100 MG) BY MOUTH DAILY 90 tablet 0  . levofloxacin (LEVAQUIN) 500 MG tablet Take 1 tablet (500 mg total) by mouth daily. 7 tablet 0  . lidocaine (XYLOCAINE) 2 % solution Use as directed 10 mLs in the mouth or throat every 4 (four) hours as needed for mouth pain. 100 mL 0  . predniSONE (DELTASONE) 20 MG tablet Take 3 tablets (60 mg total) by mouth daily with breakfast. 15 tablet 0   Facility-Administered Medications Prior to Visit  Medication Dose Route Frequency Provider Last Rate Last Dose  .  0.9 %  sodium chloride infusion  500 mL Intravenous Continuous Irene Shipper, MD        Allergies:  Allergies  Allergen Reactions  . Codeine Nausea And Vomiting     makes heart race      Review of Systems: See HPI for pertinent ROS. All other ROS negative.    Physical Exam: Blood pressure (!) 148/82, pulse 73, temperature 98 F (36.7 C), temperature source Oral, resp. rate 16, weight 180 lb 3.2 oz (81.7 kg), SpO2 97 %., Body mass index is 29.99 kg/m. General:  WNWD WF Appears in no acute distress. HEENT: Normocephalic, atraumatic, eyes without discharge, sclera non-icteric, nares are without discharge. Bilateral auditory canals clear, TM's are without perforation, pearly grey and translucent with reflective cone of light bilaterally. Oral cavity moist, posterior pharynx without exudate, erythema, peritonsillar abscess.  Neck: Supple. No thyromegaly. No lymphadenopathy. She has severe tenderness with palpation of left trapezius, at areas where there are no lymph nodes Lungs: Clear bilaterally to auscultation without wheezes, rales, or rhonchi. Breathing is unlabored. No wheezes on exam today.  Heart: Regular rhythm. No murmurs, rubs, or gallops. Msk:  Strength and tone normal for age. Extremities/Skin: Warm and dry. Neuro: Alert and oriented X 3. Moves all extremities spontaneously. Gait is normal. CNII-XII grossly in tact. Psych:  Responds to questions appropriately  with a normal affect.   Rapid Strep Test--Negative   ASSESSMENT AND PLAN:    71 y.o. year old female with    1. Viral pharyngitis Discussed that this is most likely viral which means that will run its course and resolve on its own. Need to rest voice. Use lozenges and spray as needed for throat pain. F/U if symptoms worsen or develops additional symptoms or if symptoms persist greater than 7 days. 2. Viral laryngitis  3. Sore throat - STREP GROUP A AG, W/REFLEX TO CULT    Signed, [address] [place], [place],  [facility] 05/05/2016 9:58 AM

## 2016-05-06 ENCOUNTER — Other Ambulatory Visit: Payer: Self-pay | Admitting: Family Medicine

## 2016-05-07 LAB — CULTURE, GROUP A STREP

## 2016-05-27 ENCOUNTER — Other Ambulatory Visit: Payer: Self-pay | Admitting: Family Medicine

## 2016-05-27 ENCOUNTER — Encounter: Payer: Self-pay | Admitting: Family Medicine

## 2016-05-27 ENCOUNTER — Ambulatory Visit (INDEPENDENT_AMBULATORY_CARE_PROVIDER_SITE_OTHER): Payer: Medicare Other | Admitting: Family Medicine

## 2016-05-27 VITALS — BP 136/68 | HR 70 | Temp 97.3°F | Resp 18 | Wt 174.0 lb

## 2016-05-27 DIAGNOSIS — J441 Chronic obstructive pulmonary disease with (acute) exacerbation: Secondary | ICD-10-CM

## 2016-05-27 MED ORDER — PREDNISONE 20 MG PO TABS
40.0000 mg | ORAL_TABLET | Freq: Every day | ORAL | 0 refills | Status: DC
Start: 2016-05-27 — End: 2016-06-16

## 2016-05-27 MED ORDER — ALBUTEROL SULFATE HFA 108 (90 BASE) MCG/ACT IN AERS: 2.0000 | INHALATION_SPRAY | Freq: Four times a day (QID) | RESPIRATORY_TRACT | 0 refills | Status: DC | PRN

## 2016-05-27 NOTE — Progress Notes (Signed)
Subjective:    Patient ID: Kathleen Ramirez, female    DOB: 1945/08/01, 71 y.o.   MRN: [id number]  HPI Patient was seen in March and was diagnosed with viral laryngitis. Tincture of time was recommended. Laryngitis has improved however since that time, the patient reports 2 weeks of head congestion, shortness of breath, worsening cough, wheezing. She is not using her albuterol inhaler because it ran out. She denies any purulent sputum. She denies any purulent nasal discharge. She denies any pain in her sinuses although she does report a dull frontal sinus headache, postnasal drip, and some mild dizziness. Past Medical History:  Diagnosis Date  . Bilateral ovarian tumors   . Cancer ([place])   . Cervical cancer ([place])   . Diabetes mellitus without complication ([place])   . Dysplastic colon polyp   . HLD (hyperlipidemia)   . Smoking   . Tumors    5tumors removed  . UTI (lower urinary tract infection)   . Yeast vaginitis    Past Surgical History:  Procedure Laterality Date  . ABDOMINAL HYSTERECTOMY    . APPENDECTOMY    . BLADDER NECK SUSPENSION    . BLADDER SURGERY    . BREAST LUMPECTOMY    . oophrectomy    . ORIF ULNAR FRACTURE  10/19/2011   Procedure: OPEN REDUCTION INTERNAL FIXATION (ORIF) ULNAR FRACTURE;  Surgeon: Linna Hoff, MD;  Location: [facility];  Service: Orthopedics;  Laterality: Right;  . TONSILLECTOMY    . TUBAL LIGATION    . TUMOR EXCISION     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   Current Outpatient Prescriptions on File Prior to Visit  Medication Sig Dispense Refill  . atorvastatin (LIPITOR) 20 MG tablet TAKE 1 TABLET(20 MG) BY MOUTH DAILY 90 tablet 0  . celecoxib (CELEBREX) 200 MG capsule TAKE 1 CAPSULE(200 MG) BY MOUTH TWICE DAILY 180 capsule 3  . JANUVIA 100 MG tablet TAKE 1 TABLET(100 MG) BY MOUTH DAILY 90 tablet 0   Current Facility-Administered Medications on File Prior to Visit  Medication Dose Route Frequency Provider Last Rate Last Dose  . 0.9 %  sodium  chloride infusion  500 mL Intravenous Continuous Irene Shipper, MD       Allergies  Allergen Reactions  . Codeine Nausea And Vomiting     makes heart race   Social History   Social History  . Marital status: Single    Spouse name: N/A  . Number of children: N/A  . Years of education: N/A   Occupational History  . Not on file.   Social History Main Topics  . Smoking status: Former Smoker    Packs/day: 0.00    Types: Cigarettes  . Smokeless tobacco: Never Used  . Alcohol use Yes     Comment: occasionally  . Drug use: No  . Sexual activity: Not on file     Comment: single female partner   Other Topics Concern  . Not on file   Social History Narrative  . No narrative on file     Review of Systems  Respiratory: Positive for cough.   Neurological: Positive for headaches.  All other systems reviewed and are negative.      Objective:   Physical Exam  Constitutional: She appears well-developed and well-nourished. No distress.  HENT:  Right Ear: External ear normal.  Left Ear: External ear normal.  Nose: Mucosal edema present. No rhinorrhea. Right sinus exhibits frontal sinus tenderness. Left sinus exhibits frontal sinus tenderness.  Mouth/Throat:  Oropharynx is clear and moist.  Neck: No JVD present.  Cardiovascular: Normal rate, regular rhythm and normal heart sounds.   Pulmonary/Chest: No respiratory distress. She has decreased breath sounds. She has wheezes. She has no rales.  Musculoskeletal: She exhibits no edema.  Skin: She is not diaphoretic.  Vitals reviewed.         Assessment & Plan:  COPD exacerbation ([place]) I believe the patient had a virus this subsequently led into sinusitis and bronchitis/COPD exacerbation. I believe that we can effectively treat his sinusitis and a COPD exacerbation with a round of prednisone 40 mg a day for 7 days and albuterol 2 puffs inhaled every 6 hours as needed for wheezing or coughing. I see no role for antibiotics at the  present time as the patient is afebrile, cough is nonproductive, and there is no purulent sputum or purulent nasal discharge. Should symptoms worsen, I would add Levaquin

## 2016-06-16 ENCOUNTER — Ambulatory Visit (INDEPENDENT_AMBULATORY_CARE_PROVIDER_SITE_OTHER): Payer: Medicare Other | Admitting: Family Medicine

## 2016-06-16 ENCOUNTER — Encounter: Payer: Self-pay | Admitting: Family Medicine

## 2016-06-16 ENCOUNTER — Ambulatory Visit
Admission: RE | Admit: 2016-06-16 | Discharge: 2016-06-16 | Disposition: A | Discharge status: Home / Self Care | Payer: Medicare Other | Source: Ambulatory Visit | Attending: Family Medicine | Admitting: Family Medicine

## 2016-06-16 VITALS — BP 110/60 | HR 78 | Temp 97.8°F | Resp 20 | Ht 65.0 in | Wt 175.0 lb

## 2016-06-16 DIAGNOSIS — J441 Chronic obstructive pulmonary disease with (acute) exacerbation: Secondary | ICD-10-CM | POA: Diagnosis not present

## 2016-06-16 DIAGNOSIS — R05 Cough: Secondary | ICD-10-CM | POA: Diagnosis not present

## 2016-06-16 MED ORDER — LEVOFLOXACIN 500 MG PO TABS: 500.0000 mg | ORAL_TABLET | Freq: Every day | ORAL | 0 refills | Status: DC

## 2016-06-16 MED ORDER — PREDNISONE 20 MG PO TABS: ORAL_TABLET | ORAL | 0 refills | Status: DC

## 2016-06-16 NOTE — Progress Notes (Signed)
Subjective:    Patient ID: Kathleen Ramirez, female    DOB: September 11, 1945, 71 y.o.   MRN: [id number]  Cough  Associated symptoms include headaches.  Headache   Associated symptoms include coughing.  05/27/16 Patient was seen in March and was diagnosed with viral laryngitis. Tincture of time was recommended. Laryngitis has improved however since that time, the patient reports 2 weeks of head congestion, shortness of breath, worsening cough, wheezing. She is not using her albuterol inhaler because it ran out. She denies any purulent sputum. She denies any purulent nasal discharge. She denies any pain in her sinuses although she does report a dull frontal sinus headache, postnasal drip, and some mild dizziness.  At that time, my plan was: I believe the patient had a virus this subsequently led into sinusitis and bronchitis/COPD exacerbation. I believe that we can effectively treat his sinusitis and a COPD exacerbation with a round of prednisone 40 mg a day for 7 days and albuterol 2 puffs inhaled every 6 hours as needed for wheezing or coughing. I see no role for antibiotics at the present time as the patient is afebrile, cough is nonproductive, and there is no purulent sputum or purulent nasal discharge. Should symptoms worsen, I would add Levaquin   06/16/16 The patient's symptoms improved temporarily on prednisone but have since returned just as bad as before. She reports a cough productive of green sputum. The cough is relentless and keeps her awake at night. She denies any hemoptysis. She does report pleurisy secondary to coughing. She also reports wheezing unrelieved by albuterol. She denies any fevers or chills or shortness of breath. She denies any acid reflux. She does report headache whenever she coughs. There is no peripheral edema Past Medical History:  Diagnosis Date  . Bilateral ovarian tumors   . Cancer ([place])   . Cervical cancer ([place])   . Diabetes mellitus without complication ([place])   .  Dysplastic colon polyp   . HLD (hyperlipidemia)   . Smoking   . Tumors    5tumors removed  . UTI (lower urinary tract infection)   . Yeast vaginitis    Past Surgical History:  Procedure Laterality Date  . ABDOMINAL HYSTERECTOMY    . APPENDECTOMY    . BLADDER NECK SUSPENSION    . BLADDER SURGERY    . BREAST LUMPECTOMY    . oophrectomy    . ORIF ULNAR FRACTURE  10/19/2011   Procedure: OPEN REDUCTION INTERNAL FIXATION (ORIF) ULNAR FRACTURE;  Surgeon: Linna Hoff, MD;  Location: [facility];  Service: Orthopedics;  Laterality: Right;  . TONSILLECTOMY    . TUBAL LIGATION    . TUMOR EXCISION     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   Current Outpatient Prescriptions on File Prior to Visit  Medication Sig Dispense Refill  . atorvastatin (LIPITOR) 20 MG tablet TAKE 1 TABLET(20 MG) BY MOUTH DAILY 90 tablet 0  . celecoxib (CELEBREX) 200 MG capsule TAKE 1 CAPSULE(200 MG) BY MOUTH TWICE DAILY 180 capsule 3  . JANUVIA 100 MG tablet TAKE 1 TABLET(100 MG) BY MOUTH DAILY 90 tablet 0  . VENTOLIN HFA 108 (90 Base) MCG/ACT inhaler INHALE 2 PUFFS BY MOUTH EVERY 6 HOURS AS NEEDED FOR WHEEZING OR SHORTNESS OF BREATH 18 g 0   Current Facility-Administered Medications on File Prior to Visit  Medication Dose Route Frequency Provider Last Rate Last Dose  . 0.9 %  sodium chloride infusion  500 mL Intravenous Continuous Irene Shipper, MD  Allergies  Allergen Reactions  . Codeine Nausea And Vomiting     makes heart race   Social History   Social History  . Marital status: Single    Spouse name: N/A  . Number of children: N/A  . Years of education: N/A   Occupational History  . Not on file.   Social History Main Topics  . Smoking status: Former Smoker    Packs/day: 0.00    Types: Cigarettes    Quit date: 05/12/2016  . Smokeless tobacco: Never Used  . Alcohol use Yes     Comment: occasionally  . Drug use: No  . Sexual activity: Not on file     Comment: single female partner    Other Topics Concern  . Not on file   Social History Narrative  . No narrative on file     Review of Systems  Respiratory: Positive for cough.   Neurological: Positive for headaches.  All other systems reviewed and are negative.      Objective:   Physical Exam  Constitutional: She appears well-developed and well-nourished. No distress.  HENT:  Right Ear: External ear normal.  Left Ear: External ear normal.  Nose: No mucosal edema or rhinorrhea. Right sinus exhibits no frontal sinus tenderness. Left sinus exhibits no frontal sinus tenderness.  Mouth/Throat: Oropharynx is clear and moist.  Neck: No JVD present.  Cardiovascular: Normal rate, regular rhythm and normal heart sounds.   Pulmonary/Chest: No respiratory distress. She has decreased breath sounds. She has wheezes. She has no rales.  Musculoskeletal: She exhibits no edema.  Skin: She is not diaphoretic.  Vitals reviewed.         Assessment & Plan:  COPD exacerbation ([place]) - Plan: levofloxacin (LEVAQUIN) 500 MG tablet, predniSONE (DELTASONE) 20 MG tablet, DG Chest 2 View Add Levaquin 500 mg by mouth daily for 7 days. Begin prednisone taper pack. Supplement with symbicort 160/4.5, 2 puffs inhaled twice a day. Obtain chest x-ray. 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 Chest x-ray to rule out significant pulmonary pathology. Recheck in 2 weeks. If not completely better, consider laryngo-esophageal reflux as a contributing factor

## 2016-06-30 ENCOUNTER — Ambulatory Visit (INDEPENDENT_AMBULATORY_CARE_PROVIDER_SITE_OTHER): Payer: Medicare Other | Admitting: Family Medicine

## 2016-06-30 ENCOUNTER — Encounter: Payer: Self-pay | Admitting: Family Medicine

## 2016-06-30 VITALS — BP 126/74 | HR 68 | Temp 97.9°F | Resp 18 | Ht 65.0 in | Wt 178.0 lb

## 2016-06-30 DIAGNOSIS — J410 Simple chronic bronchitis: Secondary | ICD-10-CM | POA: Diagnosis not present

## 2016-06-30 MED ORDER — BUDESONIDE-FORMOTEROL FUMARATE 160-4.5 MCG/ACT IN AERO: 2.0000 | INHALATION_SPRAY | Freq: Two times a day (BID) | RESPIRATORY_TRACT | 5 refills | Status: DC

## 2016-06-30 NOTE — Progress Notes (Signed)
Subjective:    Patient ID: Kathleen Ramirez, female    DOB: Sep 13, 1945, 71 y.o.   MRN: [id number]  Cough  Associated symptoms include headaches.  Headache   Associated symptoms include coughing.  05/27/16 Patient was seen in March and was diagnosed with viral laryngitis. Tincture of time was recommended. Laryngitis has improved however since that time, the patient reports 2 weeks of head congestion, shortness of breath, worsening cough, wheezing. She is not using her albuterol inhaler because it ran out. She denies any purulent sputum. She denies any purulent nasal discharge. She denies any pain in her sinuses although she does report a dull frontal sinus headache, postnasal drip, and some mild dizziness.  At that time, my plan was: I believe the patient had a virus this subsequently led into sinusitis and bronchitis/COPD exacerbation. I believe that we can effectively treat his sinusitis and a COPD exacerbation with a round of prednisone 40 mg a day for 7 days and albuterol 2 puffs inhaled every 6 hours as needed for wheezing or coughing. I see no role for antibiotics at the present time as the patient is afebrile, cough is nonproductive, and there is no purulent sputum or purulent nasal discharge. Should symptoms worsen, I would add Levaquin   06/16/16 The patient's symptoms improved temporarily on prednisone but have since returned just as bad as before. She reports a cough productive of green sputum. The cough is relentless and keeps her awake at night. She denies any hemoptysis. She does report pleurisy secondary to coughing. She also reports wheezing unrelieved by albuterol. She denies any fevers or chills or shortness of breath. She denies any acid reflux. She does report headache whenever she coughs. There is no peripheral edema.  At that time, my plan was: Add Levaquin 500 mg by mouth daily for 7 days. Begin prednisone taper pack. Supplement with symbicort 160/4.5, 2 puffs inhaled twice a day.  Obtain chest x-ray. 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 Chest x-ray to rule out significant pulmonary pathology. Recheck in 2 weeks. If not completely better, consider laryngo-esophageal reflux as a contributing factor  06/30/16  She is doing much better. She continues to refrain from smoking. She noticed substantial improvement with her breathing after starting Symbicort. I truly believe the patient is suffering from chronic bronchitis and that the Symbicort and the smoking cessation for helping to manage that. She denies any side effects from the medication. She denies any tachycardia or thrush in her mouth Past Medical History:  Diagnosis Date  . Bilateral ovarian tumors   . Cancer ([place])   . Cervical cancer ([place])   . Diabetes mellitus without complication ([place])   . Dysplastic colon polyp   . HLD (hyperlipidemia)   . Smoking   . Tumors    5tumors removed  . UTI (lower urinary tract infection)   . Yeast vaginitis    Past Surgical History:  Procedure Laterality Date  . ABDOMINAL HYSTERECTOMY    . APPENDECTOMY    . BLADDER NECK SUSPENSION    . BLADDER SURGERY    . BREAST LUMPECTOMY    . oophrectomy    . ORIF ULNAR FRACTURE  10/19/2011   Procedure: OPEN REDUCTION INTERNAL FIXATION (ORIF) ULNAR FRACTURE;  Surgeon: Linna Hoff, MD;  Location: [facility];  Service: Orthopedics;  Laterality: Right;  . TONSILLECTOMY    . TUBAL LIGATION    . TUMOR EXCISION     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   Current Outpatient Prescriptions  on File Prior to Visit  Medication Sig Dispense Refill  . atorvastatin (LIPITOR) 20 MG tablet TAKE 1 TABLET(20 MG) BY MOUTH DAILY 90 tablet 0  . celecoxib (CELEBREX) 200 MG capsule TAKE 1 CAPSULE(200 MG) BY MOUTH TWICE DAILY 180 capsule 3  . JANUVIA 100 MG tablet TAKE 1 TABLET(100 MG) BY MOUTH DAILY 90 tablet 0  . VENTOLIN HFA 108 (90 Base) MCG/ACT inhaler INHALE 2 PUFFS BY MOUTH EVERY 6 HOURS AS NEEDED FOR WHEEZING OR SHORTNESS OF  BREATH 18 g 0   Current Facility-Administered Medications on File Prior to Visit  Medication Dose Route Frequency Provider Last Rate Last Dose  . 0.9 %  sodium chloride infusion  500 mL Intravenous Continuous Irene Shipper, MD       Allergies  Allergen Reactions  . Codeine Nausea And Vomiting     makes heart race   Social History   Social History  . Marital status: Single    Spouse name: N/A  . Number of children: N/A  . Years of education: N/A   Occupational History  . Not on file.   Social History Main Topics  . Smoking status: Former Smoker    Packs/day: 0.00    Types: Cigarettes    Quit date: 05/12/2016  . Smokeless tobacco: Never Used  . Alcohol use Yes     Comment: occasionally  . Drug use: No  . Sexual activity: Not on file     Comment: single female partner   Other Topics Concern  . Not on file   Social History Narrative  . No narrative on file     Review of Systems  Respiratory: Positive for cough.   Neurological: Positive for headaches.  All other systems reviewed and are negative.      Objective:   Physical Exam  Constitutional: She appears well-developed and well-nourished. No distress.  HENT:  Right Ear: External ear normal.  Left Ear: External ear normal.  Nose: No mucosal edema or rhinorrhea. Right sinus exhibits no frontal sinus tenderness. Left sinus exhibits no frontal sinus tenderness.  Mouth/Throat: Oropharynx is clear and moist.  Neck: No JVD present.  Cardiovascular: Normal rate, regular rhythm and normal heart sounds.   Pulmonary/Chest: No respiratory distress. She has decreased breath sounds. She has no wheezes. She has no rales.  Musculoskeletal: She exhibits no edema.  Skin: She is not diaphoretic.  Vitals reviewed.         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 I believe that the addition of Symbicort and the discontinuation of smoking have caused her  symptoms to resolve. Chest x-ray was normal. The present time she is doing much better. I encouraged her to stay on Symbicort and to refrain from smoking and follow up as needed.

## 2016-08-03 ENCOUNTER — Other Ambulatory Visit: Payer: Self-pay | Admitting: Family Medicine

## 2016-10-03 ENCOUNTER — Other Ambulatory Visit: Payer: Self-pay | Admitting: Family Medicine

## 2016-11-10 ENCOUNTER — Other Ambulatory Visit: Payer: Self-pay | Admitting: Family Medicine

## 2016-12-10 ENCOUNTER — Telehealth: Payer: Self-pay

## 2016-12-10 NOTE — Telephone Encounter (Signed)
Prior authorization started on celecoxib 200 mg capsules

## 2016-12-18 NOTE — Telephone Encounter (Signed)
Prior authorization denied for celecoxib is there an alternative?

## 2016-12-18 NOTE — Telephone Encounter (Signed)
Meloxicam 7.5 mg p.o. daily

## 2016-12-18 NOTE — Telephone Encounter (Signed)
Will attempt to resubmit the PA  fo celecoxib patient has been on this for awhile now

## 2016-12-19 MED ORDER — MELOXICAM 7.5 MG PO TABS: 7.5000 mg | ORAL_TABLET | Freq: Every day | ORAL | 1 refills | Status: DC

## 2016-12-19 NOTE — Telephone Encounter (Signed)
Prior authorization for celecoxib has been denied.patient aware to start the meloxicam

## 2016-12-30 ENCOUNTER — Ambulatory Visit: Payer: Medicare Other | Admitting: Family Medicine

## 2017-01-30 ENCOUNTER — Other Ambulatory Visit: Payer: Self-pay | Admitting: Family Medicine

## 2017-03-09 ENCOUNTER — Ambulatory Visit (INDEPENDENT_AMBULATORY_CARE_PROVIDER_SITE_OTHER): Payer: Medicare Other | Admitting: Family Medicine

## 2017-03-09 ENCOUNTER — Encounter: Payer: Self-pay | Admitting: Family Medicine

## 2017-03-09 VITALS — BP 110/62 | HR 68 | Temp 97.9°F | Resp 18 | Ht 65.0 in | Wt 180.0 lb

## 2017-03-09 DIAGNOSIS — J441 Chronic obstructive pulmonary disease with (acute) exacerbation: Secondary | ICD-10-CM | POA: Diagnosis not present

## 2017-03-09 MED ORDER — AZITHROMYCIN 250 MG PO TABS: ORAL_TABLET | ORAL | 0 refills | Status: DC

## 2017-03-09 MED ORDER — PREDNISONE 20 MG PO TABS: ORAL_TABLET | ORAL | 0 refills | Status: DC

## 2017-03-09 NOTE — Progress Notes (Signed)
Subjective:    Patient ID: Kathleen Ramirez, female    DOB: 01-01-1946, 72 y.o.   MRN: [id number]  HPI  Patient is a very pleasant 72 year old Caucasian female with an underlying history of COPD currently on Symbicort. Developed an upper respiratory infection 2 weeks ago that has continually progressed and worsened. She is now short of breath with expiratory wheezing. She has a cough productive of green purulent sputum. She reports pleurisy and shortness of breath even at rest. She sees benefit with her albuterol because having to use it more frequently. Continues to smoke. Past Medical History:  Diagnosis Date  . Bilateral ovarian tumors   . Cancer ([place])   . Cervical cancer ([place])   . Diabetes mellitus without complication ([place])   . Dysplastic colon polyp   . HLD (hyperlipidemia)   . Smoking   . Tumors    5tumors removed  . UTI (lower urinary tract infection)   . Yeast vaginitis    Past Surgical History:  Procedure Laterality Date  . ABDOMINAL HYSTERECTOMY    . APPENDECTOMY    . BLADDER NECK SUSPENSION    . BLADDER SURGERY    . BREAST LUMPECTOMY    . oophrectomy    . ORIF ULNAR FRACTURE  10/19/2011   Procedure: OPEN REDUCTION INTERNAL FIXATION (ORIF) ULNAR FRACTURE;  Surgeon: Linna Hoff, MD;  Location: [facility];  Service: Orthopedics;  Laterality: Right;  . TONSILLECTOMY    . TUBAL LIGATION    . TUMOR EXCISION     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   Current Outpatient Medications on File Prior to Visit  Medication Sig Dispense Refill  . atorvastatin (LIPITOR) 20 MG tablet TAKE 1 TABLET(20 MG) BY MOUTH DAILY 90 tablet 0  . budesonide-formoterol (SYMBICORT) 160-4.5 MCG/ACT inhaler Inhale 2 puffs into the lungs 2 (two) times daily. 1 Inhaler 5  . JANUVIA 100 MG tablet TAKE 1 TABLET(100 MG) BY MOUTH DAILY 90 tablet 0  . VENTOLIN HFA 108 (90 Base) MCG/ACT inhaler INHALE 2 PUFFS BY MOUTH EVERY 6 HOURS AS NEEDED FOR WHEEZING OR SHORTNESS OF BREATH 18 g 0   Current  Facility-Administered Medications on File Prior to Visit  Medication Dose Route Frequency Provider Last Rate Last Dose  . 0.9 %  sodium chloride infusion  500 mL Intravenous Continuous Irene Shipper, MD       Allergies  Allergen Reactions  . Codeine Nausea And Vomiting     makes heart race   Social History   Socioeconomic History  . Marital status: Single    Spouse name: Not on file  . Number of children: Not on file  . Years of education: Not on file  . Highest education level: Not on file  Social Needs  . Financial resource strain: Not on file  . Food insecurity - worry: Not on file  . Food insecurity - inability: Not on file  . Transportation needs - medical: Not on file  . Transportation needs - non-medical: Not on file  Occupational History  . Not on file  Tobacco Use  . Smoking status: Current Some Day Smoker    Packs/day: 0.00    Types: Cigarettes  . Smokeless tobacco: Never Used  Substance and Sexual Activity  . Alcohol use: Yes    Comment: occasionally  . Drug use: No  . Sexual activity: Not on file    Comment: single female partner  Other Topics Concern  . Not on file  Social History Narrative  .  Not on file     Review of Systems  All other systems reviewed and are negative.      Objective:   Physical Exam  Constitutional: She appears well-developed and well-nourished.  Neck: Neck supple.  Cardiovascular: Normal rate, regular rhythm and normal heart sounds.  Pulmonary/Chest: Effort normal. No respiratory distress. She has wheezes. She has rales.  Lymphadenopathy:    She has no cervical adenopathy.  Vitals reviewed.         Assessment & Plan:  COPD exacerbation ([place]) - Plan: predniSONE (DELTASONE) 20 MG tablet, azithromycin (ZITHROMAX) 250 MG tablet  Patient has a COPD exacerbation. Recommended smoking cessation. Begin prednisone taper pack, ibuprofen 2 puffs inhaled every 6 hours, and a Z-Pak. Recheck in 2 weeks or sooner if worse. Patient  needs fasting lab work to monitor her diabetes and a general checkup and she has not been seen in over a year

## 2017-03-26 ENCOUNTER — Encounter: Payer: Self-pay | Admitting: Family Medicine

## 2017-03-26 ENCOUNTER — Ambulatory Visit (INDEPENDENT_AMBULATORY_CARE_PROVIDER_SITE_OTHER): Payer: Medicare Other | Admitting: Family Medicine

## 2017-03-26 VITALS — BP 122/80 | HR 66 | Temp 97.9°F | Resp 18 | Ht 65.0 in | Wt 181.0 lb

## 2017-03-26 DIAGNOSIS — Z780 Asymptomatic menopausal state: Secondary | ICD-10-CM | POA: Diagnosis not present

## 2017-03-26 DIAGNOSIS — E7800 Pure hypercholesterolemia, unspecified: Secondary | ICD-10-CM | POA: Diagnosis not present

## 2017-03-26 DIAGNOSIS — Z1159 Encounter for screening for other viral diseases: Secondary | ICD-10-CM

## 2017-03-26 DIAGNOSIS — E119 Type 2 diabetes mellitus without complications: Secondary | ICD-10-CM

## 2017-03-26 DIAGNOSIS — Z1231 Encounter for screening mammogram for malignant neoplasm of breast: Secondary | ICD-10-CM

## 2017-03-26 DIAGNOSIS — Z1239 Encounter for other screening for malignant neoplasm of breast: Secondary | ICD-10-CM

## 2017-03-26 DIAGNOSIS — Z23 Encounter for immunization: Secondary | ICD-10-CM

## 2017-03-26 NOTE — Progress Notes (Signed)
Subjective:    Patient ID: Kathleen Ramirez, female    DOB: [birth year], 72 y.o.   MRN: [id number]  HPI 03/09/17 Patient is a very pleasant 72 year old Caucasian female with an underlying history of COPD currently on Symbicort. Developed an upper respiratory infection 2 weeks ago that has continually progressed and worsened. She is now short of breath with expiratory wheezing. She has a cough productive of green purulent sputum. She reports pleurisy and shortness of breath even at rest. She sees benefit with her albuterol because having to use it more frequently. Continues to smoke.  At that time, my plan was: Patient has a COPD exacerbation. Recommended smoking cessation. Begin prednisone taper pack, ibuprofen 2 puffs inhaled every 6 hours, and a Z-Pak. Recheck in 2 weeks or sooner if worse. Patient needs fasting lab work to monitor her diabetes and a general checkup and she has not been seen in over a year  03/26/17 Patient is here today for follow-up.  She continues to endorse wheezing and coughing and mild dyspnea on exertion.  She continues to smoke.  I asked her to come back in because she is long overdue for a diabetes checkup.  She is not monitoring her sugars at all.  She denies any episodes of hypoglycemia.  She denies any polyuria, polydipsia, or blurry vision.  She denies any chest pain.  Her blood pressure is well controlled at 122/80.  She is due for Prevnar 13.  She is due for a flu shot.  Colonoscopy is up-to-date however she is due for a bone density test, a diabetic eye exam, a diabetic foot exam, as well as a mammogram.  She is also due for hepatitis C screening. Past Medical History:  Diagnosis Date  . Bilateral ovarian tumors   . Cancer ([place])   . Cervical cancer ([place])   . Diabetes mellitus without complication ([place])   . Dysplastic colon polyp   . HLD (hyperlipidemia)   . Smoking   . Tumors    5tumors removed  . UTI (lower urinary tract infection)   . Yeast vaginitis    Past  Surgical History:  Procedure Laterality Date  . ABDOMINAL HYSTERECTOMY    . APPENDECTOMY    . BLADDER NECK SUSPENSION    . BLADDER SURGERY    . BREAST LUMPECTOMY    . oophrectomy    . ORIF ULNAR FRACTURE  10/19/2011   Procedure: OPEN REDUCTION INTERNAL FIXATION (ORIF) ULNAR FRACTURE;  Surgeon: Linna Hoff, MD;  Location: [facility];  Service: Orthopedics;  Laterality: Right;  . TONSILLECTOMY    . TUBAL LIGATION    . TUMOR EXCISION     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   Current Outpatient Medications on File Prior to Visit  Medication Sig Dispense Refill  . atorvastatin (LIPITOR) 20 MG tablet TAKE 1 TABLET(20 MG) BY MOUTH DAILY 90 tablet 0  . budesonide-formoterol (SYMBICORT) 160-4.5 MCG/ACT inhaler Inhale 2 puffs into the lungs 2 (two) times daily. 1 Inhaler 5  . JANUVIA 100 MG tablet TAKE 1 TABLET(100 MG) BY MOUTH DAILY 90 tablet 0  . VENTOLIN HFA 108 (90 Base) MCG/ACT inhaler INHALE 2 PUFFS BY MOUTH EVERY 6 HOURS AS NEEDED FOR WHEEZING OR SHORTNESS OF BREATH 18 g 0   Current Facility-Administered Medications on File Prior to Visit  Medication Dose Route Frequency Provider Last Rate Last Dose  . 0.9 %  sodium chloride infusion  500 mL Intravenous Continuous Irene Shipper, MD  Allergies  Allergen Reactions  . Codeine Nausea And Vomiting     makes heart race   Social History   Socioeconomic History  . Marital status: Single    Spouse name: Not on file  . Number of children: Not on file  . Years of education: Not on file  . Highest education level: Not on file  Social Needs  . Financial resource strain: Not on file  . Food insecurity - worry: Not on file  . Food insecurity - inability: Not on file  . Transportation needs - medical: Not on file  . Transportation needs - non-medical: Not on file  Occupational History  . Not on file  Tobacco Use  . Smoking status: Current Some Day Smoker    Packs/day: 0.00    Types: Cigarettes  . Smokeless tobacco: Never  Used  Substance and Sexual Activity  . Alcohol use: Yes    Comment: occasionally  . Drug use: No  . Sexual activity: Not on file    Comment: single female partner  Other Topics Concern  . Not on file  Social History Narrative  . Not on file     Review of Systems  All other systems reviewed and are negative.      Objective:   Physical Exam  Constitutional: She appears well-developed and well-nourished.  Neck: Neck supple.  Cardiovascular: Normal rate, regular rhythm and normal heart sounds.  Pulmonary/Chest: Effort normal. No respiratory distress. She has wheezes. She has rales.  Lymphadenopathy:    She has no cervical adenopathy.  Vitals reviewed.         Assessment & Plan:  Controlled type 2 diabetes mellitus without complication, without long-term current use of insulin ([place]) - Plan: CBC with Differential/Platelet, COMPLETE METABOLIC PANEL WITH GFR, Lipid panel, Microalbumin, urine, Hemoglobin A1c, Ambulatory referral to Ophthalmology  Pure hypercholesterolemia  Encounter for hepatitis C screening test for low risk patient - Plan: Hepatitis C Antibody  Breast cancer screening - Plan: MM Digital Screening  Postmenopausal estrogen deficiency - Plan: DG Bone Density  Need for prophylactic vaccination against Streptococcus pneumoniae (pneumococcus) - Plan: Pneumococcal conjugate vaccine 13-valent IM  I will check a hemoglobin A1c today pertaining to her diabetes.  Goal hemoglobin A1c is less than 6.5.  Diabetic foot exam is performed and is normal.  I will consult ophthalmology for diabetic eye exam.  I will also check a urine microalbumin along with a fasting lipid panel.  Goal LDL cholesterol is less than 100.  I recommended a flu shot which the patient refused.  I recommended Prevnar 13 which she agreed to.  I will also screen the patient for hepatitis C.  I will schedule the patient for a mammogram which she reluctantly agreed to.  I will also schedule the patient for a  bone density test to screen for osteoporosis.  Blood pressure is well controlled today.  Discontinue Symbicort and replaced with trelegy 1 inhalation a day.  Reassess breathing in 1 month.  Continue to encourage smoking cessation.

## 2017-03-27 LAB — CBC WITH DIFFERENTIAL/PLATELET
BASOS ABS: 108 {cells}/uL (ref 0–200)
Basophils Relative: 1.2 %
EOS ABS: 387 {cells}/uL (ref 15–500)
Eosinophils Relative: 4.3 %
HCT: 44.6 % (ref 35.0–45.0)
HEMOGLOBIN: 15.3 g/dL (ref 11.7–15.5)
Lymphs Abs: 3348 cells/uL (ref 850–3900)
MCH: 29.3 pg (ref 27.0–33.0)
MCHC: 34.3 g/dL (ref 32.0–36.0)
MCV: 85.3 fL (ref 80.0–100.0)
MONOS PCT: 7.8 %
MPV: 11.2 fL (ref 7.5–12.5)
NEUTROS ABS: 4455 {cells}/uL (ref 1500–7800)
Neutrophils Relative %: 49.5 %
Platelets: 203 10*3/uL (ref 140–400)
RBC: 5.23 10*6/uL — ABNORMAL HIGH (ref 3.80–5.10)
RDW: 12.2 % (ref 11.0–15.0)
Total Lymphocyte: 37.2 %
WBC mixed population: 702 cells/uL (ref 200–950)
WBC: 9 10*3/uL (ref 3.8–10.8)

## 2017-03-27 LAB — COMPLETE METABOLIC PANEL WITH GFR
AG Ratio: 1.7 (calc) (ref 1.0–2.5)
ALBUMIN MSPROF: 4.3 g/dL (ref 3.6–5.1)
ALKALINE PHOSPHATASE (APISO): 53 U/L (ref 33–130)
ALT: 14 U/L (ref 6–29)
AST: 15 U/L (ref 10–35)
BUN / CREAT RATIO: 24 (calc) — AB (ref 6–22)
BUN: 24 mg/dL (ref 7–25)
CO2: 21 mmol/L (ref 20–32)
Calcium: 9.5 mg/dL (ref 8.6–10.4)
Chloride: 104 mmol/L (ref 98–110)
Creat: 1.02 mg/dL — ABNORMAL HIGH (ref 0.60–0.93)
GFR, Est African American: 65 mL/min/{1.73_m2} (ref >=60)
GFR, Est Non African American: 56 mL/min/{1.73_m2} — ABNORMAL LOW (ref >=60)
GLOBULIN: 2.5 g/dL (ref 1.9–3.7)
Glucose, Bld: 173 mg/dL — ABNORMAL HIGH (ref 65–99)
POTASSIUM: 4.9 mmol/L (ref 3.5–5.3)
Sodium: 137 mmol/L (ref 135–146)
Total Bilirubin: 0.5 mg/dL (ref 0.2–1.2)
Total Protein: 6.8 g/dL (ref 6.1–8.1)

## 2017-03-27 LAB — HEMOGLOBIN A1C
Hgb A1c MFr Bld: 8.5 %{Hb} — ABNORMAL HIGH
Mean Plasma Glucose: 197 (calc)
eAG (mmol/L): 10.9 (calc)

## 2017-03-27 LAB — LIPID PANEL
Cholesterol: 148 mg/dL
HDL: 49 mg/dL — ABNORMAL LOW
LDL Cholesterol (Calc): 72 mg/dL
Non-HDL Cholesterol (Calc): 99 mg/dL
Total CHOL/HDL Ratio: 3 (calc)
Triglycerides: 202 mg/dL — ABNORMAL HIGH

## 2017-03-27 LAB — HEPATITIS C ANTIBODY
Hepatitis C Ab: NONREACTIVE
SIGNAL TO CUT-OFF: 0.01 (ref <1.00)

## 2017-03-27 LAB — MICROALBUMIN, URINE: Microalb, Ur: 0.5 mg/dL

## 2017-04-08 ENCOUNTER — Telehealth: Payer: Self-pay | Admitting: Family Medicine

## 2017-04-08 MED ORDER — METFORMIN HCL 500 MG PO TABS: ORAL_TABLET | ORAL | 3 refills | Status: DC

## 2017-04-08 NOTE — Telephone Encounter (Signed)
Called pt will lab results and she wanted to know if there was something else besides the Mobic she could take for arthritis pain - she is taking 7.5mg  once a day.   Ok to leave message on VM if she does not answer.

## 2017-04-09 MED ORDER — DICLOFENAC SODIUM 75 MG PO TBEC: 75.0000 mg | DELAYED_RELEASE_TABLET | Freq: Two times a day (BID) | ORAL | 3 refills | Status: DC

## 2017-04-09 NOTE — Telephone Encounter (Signed)
Pt aware and med sent to pharm 

## 2017-04-09 NOTE — Telephone Encounter (Signed)
Try diclofenac 75 mg pobid prn

## 2017-04-30 ENCOUNTER — Other Ambulatory Visit: Payer: Self-pay | Admitting: Family Medicine

## 2017-05-10 ENCOUNTER — Other Ambulatory Visit: Payer: Self-pay | Admitting: Family Medicine

## 2017-06-10 ENCOUNTER — Other Ambulatory Visit: Payer: Self-pay | Admitting: Family Medicine

## 2017-06-15 DIAGNOSIS — Z961 Presence of intraocular lens: Secondary | ICD-10-CM | POA: Diagnosis not present

## 2017-06-15 DIAGNOSIS — H26491 Other secondary cataract, right eye: Secondary | ICD-10-CM | POA: Diagnosis not present

## 2017-06-15 DIAGNOSIS — E119 Type 2 diabetes mellitus without complications: Secondary | ICD-10-CM | POA: Diagnosis not present

## 2017-06-15 DIAGNOSIS — H353132 Nonexudative age-related macular degeneration, bilateral, intermediate dry stage: Secondary | ICD-10-CM | POA: Diagnosis not present

## 2017-06-15 LAB — HM DIABETES EYE EXAM

## 2017-06-17 ENCOUNTER — Encounter: Payer: Self-pay | Admitting: *Deleted

## 2017-06-22 ENCOUNTER — Other Ambulatory Visit: Payer: Self-pay | Admitting: Family Medicine

## 2017-07-17 ENCOUNTER — Other Ambulatory Visit: Payer: Medicare Other

## 2017-07-17 ENCOUNTER — Ambulatory Visit: Payer: Medicare Other

## 2017-07-21 ENCOUNTER — Ambulatory Visit
Admission: RE | Admit: 2017-07-21 | Discharge: 2017-07-21 | Disposition: A | Discharge status: Home / Self Care | Payer: Medicare Other | Source: Ambulatory Visit | Attending: Family Medicine | Admitting: Family Medicine

## 2017-07-21 DIAGNOSIS — Z780 Asymptomatic menopausal state: Secondary | ICD-10-CM

## 2017-07-21 DIAGNOSIS — Z1239 Encounter for other screening for malignant neoplasm of breast: Secondary | ICD-10-CM

## 2017-07-21 DIAGNOSIS — Z1231 Encounter for screening mammogram for malignant neoplasm of breast: Secondary | ICD-10-CM | POA: Diagnosis not present

## 2017-07-22 ENCOUNTER — Other Ambulatory Visit: Payer: Self-pay | Admitting: Family Medicine

## 2017-07-22 ENCOUNTER — Other Ambulatory Visit (HOSPITAL_COMMUNITY): Payer: Self-pay | Admitting: Lab

## 2017-07-22 DIAGNOSIS — R928 Other abnormal and inconclusive findings on diagnostic imaging of breast: Secondary | ICD-10-CM

## 2017-07-24 ENCOUNTER — Other Ambulatory Visit: Payer: Self-pay | Admitting: Family Medicine

## 2017-07-27 ENCOUNTER — Encounter: Payer: Self-pay | Admitting: Family Medicine

## 2017-07-31 ENCOUNTER — Ambulatory Visit
Admission: RE | Admit: 2017-07-31 | Discharge: 2017-07-31 | Disposition: A | Discharge status: Home / Self Care | Payer: Medicare Other | Source: Ambulatory Visit | Attending: Family Medicine | Admitting: Family Medicine

## 2017-07-31 DIAGNOSIS — R928 Other abnormal and inconclusive findings on diagnostic imaging of breast: Secondary | ICD-10-CM | POA: Diagnosis not present

## 2017-07-31 DIAGNOSIS — N6002 Solitary cyst of left breast: Secondary | ICD-10-CM | POA: Diagnosis not present

## 2017-08-05 ENCOUNTER — Other Ambulatory Visit: Payer: Self-pay | Admitting: Family Medicine

## 2017-09-15 ENCOUNTER — Other Ambulatory Visit: Payer: Self-pay | Admitting: Family Medicine

## 2017-09-18 ENCOUNTER — Ambulatory Visit (INDEPENDENT_AMBULATORY_CARE_PROVIDER_SITE_OTHER): Payer: Medicare Other | Admitting: Family Medicine

## 2017-09-18 ENCOUNTER — Encounter: Payer: Self-pay | Admitting: Family Medicine

## 2017-09-18 VITALS — BP 146/72 | HR 66 | Temp 98.1°F | Resp 18 | Ht 65.0 in | Wt 174.0 lb

## 2017-09-18 DIAGNOSIS — E119 Type 2 diabetes mellitus without complications: Secondary | ICD-10-CM | POA: Diagnosis not present

## 2017-09-18 DIAGNOSIS — M25561 Pain in right knee: Secondary | ICD-10-CM | POA: Diagnosis not present

## 2017-09-18 NOTE — Progress Notes (Signed)
Subjective:    Patient ID: Kathleen Ramirez, female    DOB: 10/15/1945, 72 y.o.   MRN: [id number]  Medication Refill    03/09/17 Patient is a very pleasant 72 year old Caucasian female with an underlying history of COPD currently on Symbicort. Developed an upper respiratory infection 2 weeks ago that has continually progressed and worsened. She is now short of breath with expiratory wheezing. She has a cough productive of green purulent sputum. She reports pleurisy and shortness of breath even at rest. She sees benefit with her albuterol because having to use it more frequently. Continues to smoke.  At that time, my plan was: Patient has a COPD exacerbation. Recommended smoking cessation. Begin prednisone taper pack, ibuprofen 2 puffs inhaled every 6 hours, and a Z-Pak. Recheck in 2 weeks or sooner if worse. Patient needs fasting lab work to monitor her diabetes and a general checkup and she has not been seen in over a year  03/26/17 Patient is here today for follow-up.  She continues to endorse wheezing and coughing and mild dyspnea on exertion.  She continues to smoke.  I asked her to come back in because she is long overdue for a diabetes checkup.  She is not monitoring her sugars at all.  She denies any episodes of hypoglycemia.  She denies any polyuria, polydipsia, or blurry vision.  She denies any chest pain.  Her blood pressure is well controlled at 122/80.  She is due for Prevnar 13.  She is due for a flu shot.  Colonoscopy is up-to-date however she is due for a bone density test, a diabetic eye exam, a diabetic foot exam, as well as a mammogram.  She is also due for hepatitis C screening.  At that time, my plan was: I will check a hemoglobin A1c today pertaining to her diabetes.  Goal hemoglobin A1c is less than 6.5.  Diabetic foot exam is performed and is normal.  I will consult ophthalmology for diabetic eye exam.  I will also check a urine microalbumin along with a fasting lipid panel.  Goal LDL  cholesterol is less than 100.  I recommended a flu shot which the patient refused.  I recommended Prevnar 13 which she agreed to.  I will also screen the patient for hepatitis C.  I will schedule the patient for a mammogram which she reluctantly agreed to.  I will also schedule the patient for a bone density test to screen for osteoporosis.  Blood pressure is well controlled today.  Discontinue Symbicort and replaced with trelegy 1 inhalation a day.  Reassess breathing in 1 month.  Continue to encourage smoking cessation.  09/18/17 Patient had a hemoglobin A1c of 8.5.  At that time I recommended starting metformin.  Patient never followed up until now.  She admits that she is not taking metformin.  Instead she tried lifestyle changes including dietary changes and weight loss.  She is lost 7 pounds since her last visit.  She is very interested in seeing what her hemoglobin A1c is.  At the present time she is only taking Januvia to try to lower his sugars however she is given up carbohydrates.  She states that she has not eaten bread in over 3 months.  She is no longer drinking sodas.  She is not engaging in any exercise primarily because her right leg hurts.  She reports moderate to severe pain in the right knee over the medial joint line.  She also reports locking, popping sensation at times,  and difficulty fully extending and flexing the knee due to pain.  It hurts to walk.  It hurts to stand.  Now she has developed pain in her quadriceps as well as in her right hip that sounds more like muscle irritation from walking with an antalgic gait compensating for her knee pain.  She denies any neuropathic pain. Past Medical History:  Diagnosis Date  . Bilateral ovarian tumors   . Cancer ([place])   . Cervical cancer ([place])   . Diabetes mellitus without complication ([place])   . Dysplastic colon polyp   . HLD (hyperlipidemia)   . Smoking   . Tumors    5tumors removed  . UTI (lower urinary tract infection)   . Yeast  vaginitis    Past Surgical History:  Procedure Laterality Date  . ABDOMINAL HYSTERECTOMY    . APPENDECTOMY    . BLADDER NECK SUSPENSION    . BLADDER SURGERY    . BREAST LUMPECTOMY    . oophrectomy    . ORIF ULNAR FRACTURE  10/19/2011   Procedure: OPEN REDUCTION INTERNAL FIXATION (ORIF) ULNAR FRACTURE;  Surgeon: Linna Hoff, MD;  Location: [facility];  Service: Orthopedics;  Laterality: Right;  . TONSILLECTOMY    . TUBAL LIGATION    . TUMOR EXCISION     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   Current Outpatient Medications on File Prior to Visit  Medication Sig Dispense Refill  . albuterol (PROVENTIL HFA;VENTOLIN HFA) 108 (90 Base) MCG/ACT inhaler INHALE 2 PUFFS BY MOUTH EVERY 6 HOURS AS NEEDED FOR WHEEZING& SHORTNESS OF BREATH 18 g 0  . atorvastatin (LIPITOR) 20 MG tablet TAKE 1 TABLET(20 MG) BY MOUTH DAILY 90 tablet 2  . diclofenac (VOLTAREN) 75 MG EC tablet TAKE 1 TABLET(75 MG) BY MOUTH TWICE DAILY 60 tablet 0  . JANUVIA 100 MG tablet TAKE 1 TABLET(100 MG) BY MOUTH DAILY 90 tablet 3  . meloxicam (MOBIC) 7.5 MG tablet TAKE 1 TABLET(7.5 MG) BY MOUTH DAILY 90 tablet 1  . metFORMIN (GLUCOPHAGE) 500 MG tablet TAKE 1 TABLET BY MOUTH TWICE DAILY FOR 2 WEEKS, THEN 2 TABLETS TWICE DAILY 120 tablet 0  . SYMBICORT 160-4.5 MCG/ACT inhaler INHALE 2 PUFFS INTO THE LUNGS TWICE DAILY 10.2 g 5   Current Facility-Administered Medications on File Prior to Visit  Medication Dose Route Frequency Provider Last Rate Last Dose  . 0.9 %  sodium chloride infusion  500 mL Intravenous Continuous Irene Shipper, MD       Allergies  Allergen Reactions  . Codeine Nausea And Vomiting     makes heart race   Social History   Socioeconomic History  . Marital status: Single    Spouse name: Not on file  . Number of children: Not on file  . Years of education: Not on file  . Highest education level: Not on file  Occupational History  . Not on file  Social Needs  . Financial resource strain: Not on file   . Food insecurity:    Worry: Not on file    Inability: Not on file  . Transportation needs:    Medical: Not on file    Non-medical: Not on file  Tobacco Use  . Smoking status: Current Some Day Smoker    Packs/day: 0.00    Types: Cigarettes  . Smokeless tobacco: Never Used  Substance and Sexual Activity  . Alcohol use: Yes    Comment: occasionally  . Drug use: No  . Sexual activity: Not on file  Comment: single female partner  Lifestyle  . Physical activity:    Days per week: Not on file    Minutes per session: Not on file  . Stress: Not on file  Relationships  . Social connections:    Talks on phone: Not on file    Gets together: Not on file    Attends religious service: Not on file    Active member of club or organization: Not on file    Attends meetings of clubs or organizations: Not on file    Relationship status: Not on file  . Intimate partner violence:    Fear of current or ex partner: Not on file    Emotionally abused: Not on file    Physically abused: Not on file    Forced sexual activity: Not on file  Other Topics Concern  . Not on file  Social History Narrative  . Not on file     Review of Systems  All other systems reviewed and are negative.      Objective:   Physical Exam  Constitutional: She appears well-developed and well-nourished.  Neck: Neck supple.  Cardiovascular: Normal rate, regular rhythm and normal heart sounds.  Pulmonary/Chest: Effort normal. No respiratory distress. She has wheezes. She has rales.  Musculoskeletal:       Right knee: She exhibits decreased range of motion and abnormal meniscus. She exhibits no LCL laxity and no MCL laxity. Tenderness found. Medial joint line and lateral joint line tenderness noted. No MCL and no LCL tenderness noted.  Lymphadenopathy:    She has no cervical adenopathy.  Vitals reviewed.         Assessment & Plan:  Controlled type 2 diabetes mellitus without complication, without long-term  current use of insulin ([place]) - Plan: Hemoglobin A1c, COMPLETE METABOLIC PANEL WITH GFR, Lipid panel, Microalbumin, urine  Acute pain of right knee - Plan: DG Knee Complete 4 Views Right  Check hemoglobin A1c today along with a fasting lipid panel and a urine microalbumin.  If hemoglobin A1c is not less than 7, I would recommend adding metformin.  Goal LDL cholesterol is less than 100.  I offer the patient a cortisone injection as I suspect that she has osteoarthritis in the right knee.  I am unable to get a adequate exam today of her meniscus due to the pain that she is reporting.  She jumps and flinches every time I try to flex and extend her knee and therefore I do not believe that the pain she experienced with Apley grind is necessarily due to a meniscal tear.  I offered the patient a cortisone injection.  She declined even though diclofenac is not easing her pain.  Instead she prefers to get a x-ray of the right knee first to evaluate further.  This was ordered in epic.  Await the results of the knee x-ray

## 2017-09-19 LAB — COMPLETE METABOLIC PANEL WITH GFR
AG Ratio: 1.9 (calc) (ref 1.0–2.5)
ALBUMIN MSPROF: 4.4 g/dL (ref 3.6–5.1)
ALT: 15 U/L (ref 6–29)
AST: 14 U/L (ref 10–35)
Alkaline phosphatase (APISO): 52 U/L (ref 33–130)
BUN / CREAT RATIO: 17 (calc) (ref 6–22)
BUN: 16 mg/dL (ref 7–25)
CO2: 24 mmol/L (ref 20–32)
CREATININE: 0.96 mg/dL — AB (ref 0.60–0.93)
Calcium: 10.1 mg/dL (ref 8.6–10.4)
Chloride: 104 mmol/L (ref 98–110)
GFR, EST AFRICAN AMERICAN: 69 mL/min/{1.73_m2} (ref >=60)
GFR, EST NON AFRICAN AMERICAN: 60 mL/min/{1.73_m2} (ref >=60)
GLOBULIN: 2.3 g/dL (ref 1.9–3.7)
Glucose, Bld: 126 mg/dL — ABNORMAL HIGH (ref 65–99)
POTASSIUM: 4.2 mmol/L (ref 3.5–5.3)
Sodium: 137 mmol/L (ref 135–146)
Total Bilirubin: 0.6 mg/dL (ref 0.2–1.2)
Total Protein: 6.7 g/dL (ref 6.1–8.1)

## 2017-09-19 LAB — LIPID PANEL
CHOLESTEROL: 185 mg/dL (ref <200)
HDL: 43 mg/dL — ABNORMAL LOW (ref >50)
LDL CHOLESTEROL (CALC): 97 mg/dL
Non-HDL Cholesterol (Calc): 142 mg/dL (calc) — ABNORMAL HIGH (ref <130)
TRIGLYCERIDES: 338 mg/dL — AB (ref <150)
Total CHOL/HDL Ratio: 4.3 (calc) (ref <5.0)

## 2017-09-19 LAB — HEMOGLOBIN A1C
EAG (MMOL/L): 10.8 (calc)
HEMOGLOBIN A1C: 8.4 %{Hb} — AB (ref <5.7)
Mean Plasma Glucose: 194 (calc)

## 2017-09-19 LAB — MICROALBUMIN, URINE: MICROALB UR: 1 mg/dL

## 2017-09-22 ENCOUNTER — Ambulatory Visit
Admission: RE | Admit: 2017-09-22 | Discharge: 2017-09-22 | Disposition: A | Discharge status: Home / Self Care | Payer: Medicare Other | Source: Ambulatory Visit | Attending: Family Medicine | Admitting: Family Medicine

## 2017-09-22 ENCOUNTER — Other Ambulatory Visit: Payer: Self-pay | Admitting: Family Medicine

## 2017-09-22 DIAGNOSIS — M25561 Pain in right knee: Secondary | ICD-10-CM

## 2017-09-22 NOTE — Progress Notes (Signed)
ref

## 2017-10-26 DIAGNOSIS — M25551 Pain in right hip: Secondary | ICD-10-CM | POA: Diagnosis not present

## 2017-10-26 DIAGNOSIS — M1611 Unilateral primary osteoarthritis, right hip: Secondary | ICD-10-CM | POA: Diagnosis not present

## 2017-11-10 ENCOUNTER — Other Ambulatory Visit: Payer: Self-pay | Admitting: Family Medicine

## 2017-11-13 DIAGNOSIS — M25551 Pain in right hip: Secondary | ICD-10-CM | POA: Insufficient documentation

## 2017-11-13 DIAGNOSIS — M1611 Unilateral primary osteoarthritis, right hip: Secondary | ICD-10-CM | POA: Diagnosis not present

## 2017-11-27 ENCOUNTER — Ambulatory Visit (INDEPENDENT_AMBULATORY_CARE_PROVIDER_SITE_OTHER): Payer: Medicare Other | Admitting: Family Medicine

## 2017-11-27 ENCOUNTER — Encounter: Payer: Self-pay | Admitting: Family Medicine

## 2017-11-27 VITALS — BP 130/78 | HR 85 | Temp 98.3°F | Resp 18 | Ht 65.0 in | Wt 170.0 lb

## 2017-11-27 DIAGNOSIS — E118 Type 2 diabetes mellitus with unspecified complications: Secondary | ICD-10-CM | POA: Diagnosis not present

## 2017-11-27 DIAGNOSIS — E7800 Pure hypercholesterolemia, unspecified: Secondary | ICD-10-CM

## 2017-11-27 NOTE — Progress Notes (Signed)
Subjective:    Patient ID: Kathleen Ramirez, female    DOB: 1945-08-15, 72 y.o.   MRN: [id number]  Medication Refill    03/09/17 Patient is a very pleasant 72 year old Caucasian female with an underlying history of COPD currently on Symbicort. Developed an upper respiratory infection 2 weeks ago that has continually progressed and worsened. She is now short of breath with expiratory wheezing. She has a cough productive of green purulent sputum. She reports pleurisy and shortness of breath even at rest. She sees benefit with her albuterol because having to use it more frequently. Continues to smoke.  At that time, my plan was: Patient has a COPD exacerbation. Recommended smoking cessation. Begin prednisone taper pack, ibuprofen 2 puffs inhaled every 6 hours, and a Z-Pak. Recheck in 2 weeks or sooner if worse. Patient needs fasting lab work to monitor her diabetes and a general checkup and she has not been seen in over a year  03/26/17 Patient is here today for follow-up.  She continues to endorse wheezing and coughing and mild dyspnea on exertion.  She continues to smoke.  I asked her to come back in because she is long overdue for a diabetes checkup.  She is not monitoring her sugars at all.  She denies any episodes of hypoglycemia.  She denies any polyuria, polydipsia, or blurry vision.  She denies any chest pain.  Her blood pressure is well controlled at 122/80.  She is due for Prevnar 13.  She is due for a flu shot.  Colonoscopy is up-to-date however she is due for a bone density test, a diabetic eye exam, a diabetic foot exam, as well as a mammogram.  She is also due for hepatitis C screening.  At that time, my plan was: I will check a hemoglobin A1c today pertaining to her diabetes.  Goal hemoglobin A1c is less than 6.5.  Diabetic foot exam is performed and is normal.  I will consult ophthalmology for diabetic eye exam.  I will also check a urine microalbumin along with a fasting lipid panel.  Goal LDL  cholesterol is less than 100.  I recommended a flu shot which the patient refused.  I recommended Prevnar 13 which she agreed to.  I will also screen the patient for hepatitis C.  I will schedule the patient for a mammogram which she reluctantly agreed to.  I will also schedule the patient for a bone density test to screen for osteoporosis.  Blood pressure is well controlled today.  Discontinue Symbicort and replaced with trelegy 1 inhalation a day.  Reassess breathing in 1 month.  Continue to encourage smoking cessation.  09/18/17 Patient had a hemoglobin A1c of 8.5.  At that time I recommended starting metformin.  Patient never followed up until now.  She admits that she is not taking metformin.  Instead she tried lifestyle changes including dietary changes and weight loss.  She is lost 7 pounds since her last visit.  She is very interested in seeing what her hemoglobin A1c is.  At the present time she is only taking Januvia to try to lower his sugars however she is given up carbohydrates.  She states that she has not eaten bread in over 3 months.  She is no longer drinking sodas.  She is not engaging in any exercise primarily because her right leg hurts.  She reports moderate to severe pain in the right knee over the medial joint line.  She also reports locking, popping sensation at times,  and difficulty fully extending and flexing the knee due to pain.  It hurts to walk.  It hurts to stand.  Now she has developed pain in her quadriceps as well as in her right hip that sounds more like muscle irritation from walking with an antalgic gait compensating for her knee pain.  She denies any neuropathic pain.  At that time, my plan was: Check hemoglobin A1c today along with a fasting lipid panel and a urine microalbumin.  If hemoglobin A1c is not less than 7, I would recommend adding metformin.  Goal LDL cholesterol is less than 100.  I offer the patient a cortisone injection as I suspect that she has osteoarthritis  in the right knee.  I am unable to get a adequate exam today of her meniscus due to the pain that she is reporting.  She jumps and flinches every time I try to flex and extend her knee and therefore I do not believe that the pain she experienced with Apley grind is necessarily due to a meniscal tear.  I offered the patient a cortisone injection.  She declined even though diclofenac is not easing her pain.  Instead she prefers to get a x-ray of the right knee first to evaluate further.  This was ordered in epic.  Await the results of the knee x-ray  11/27/17 Patient has seen [facility] orthopedics.  Despite the pain in her right knee, they have determined the problems coming for a severe problem in her right hip and I recommended a hip replacement.  She is currently seeing Dr. [name].  She is here today for surgical clearance.  She is extremely tearful and distraught.  She is very concerned having the surgery.  She is currently on Januvia and metformin for diabetes.  Her last hemoglobin A1c was 8.4.  However she is only been on the metformin for 2 weeks.  She is unable to tolerate the metformin due to severe diarrhea.  She states that Imodium will not help the diarrhea.  Furthermore she is vomiting almost every morning when she takes the medication.  She is alternating diclofenac and meloxicam on an every other day basis.  However she sees very little benefit from this.  She denies any chest pain, shortness of breath, dyspnea on exertion.  She denies any symptoms of uncontrolled congestive heart failure.  She denies any polyuria polydipsia oliguria or hematuria. Past Medical History:  Diagnosis Date  . Bilateral ovarian tumors   . Cancer ([place])   . Cervical cancer ([place])   . Diabetes mellitus without complication ([place])   . Dysplastic colon polyp   . HLD (hyperlipidemia)   . Smoking   . Tumors    5tumors removed  . UTI (lower urinary tract infection)   . Yeast vaginitis    Past Surgical History:    Procedure Laterality Date  . ABDOMINAL HYSTERECTOMY    . APPENDECTOMY    . BLADDER NECK SUSPENSION    . BLADDER SURGERY    . BREAST LUMPECTOMY    . oophrectomy    . ORIF ULNAR FRACTURE  10/19/2011   Procedure: OPEN REDUCTION INTERNAL FIXATION (ORIF) ULNAR FRACTURE;  Surgeon: Linna Hoff, MD;  Location: [facility];  Service: Orthopedics;  Laterality: Right;  . TONSILLECTOMY    . TUBAL LIGATION    . TUMOR EXCISION     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   Current Outpatient Medications on File Prior to Visit  Medication Sig Dispense Refill  . albuterol (  PROVENTIL HFA;VENTOLIN HFA) 108 (90 Base) MCG/ACT inhaler INHALE 2 PUFFS BY MOUTH EVERY 6 HOURS AS NEEDED FOR WHEEZING& SHORTNESS OF BREATH 18 g 0  . atorvastatin (LIPITOR) 20 MG tablet TAKE 1 TABLET(20 MG) BY MOUTH DAILY 90 tablet 2  . diclofenac (VOLTAREN) 75 MG EC tablet TAKE 1 TABLET(75 MG) BY MOUTH TWICE DAILY 60 tablet 0  . JANUVIA 100 MG tablet TAKE 1 TABLET(100 MG) BY MOUTH DAILY 90 tablet 3  . metFORMIN (GLUCOPHAGE) 500 MG tablet TAKE 1 TABLET BY MOUTH TWICE DAILY FOR 2 WEEKS, THEN 2 TABLETS TWICE DAILY 120 tablet 1  . SYMBICORT 160-4.5 MCG/ACT inhaler INHALE 2 PUFFS INTO THE LUNGS TWICE DAILY 10.2 g 5   Current Facility-Administered Medications on File Prior to Visit  Medication Dose Route Frequency Provider Last Rate Last Dose  . 0.9 %  sodium chloride infusion  500 mL Intravenous Continuous Irene Shipper, MD       Allergies  Allergen Reactions  . Codeine Nausea And Vomiting     makes heart race   Social History   Socioeconomic History  . Marital status: Single    Spouse name: Not on file  . Number of children: Not on file  . Years of education: Not on file  . Highest education level: Not on file  Occupational History  . Not on file  Social Needs  . Financial resource strain: Not on file  . Food insecurity:    Worry: Not on file    Inability: Not on file  . Transportation needs:    Medical: Not on file     Non-medical: Not on file  Tobacco Use  . Smoking status: Former Smoker    Packs/day: 0.00    Types: Cigarettes    Last attempt to quit: 11/13/2017    Years since quitting: 0.0  . Smokeless tobacco: Never Used  Substance and Sexual Activity  . Alcohol use: Yes    Comment: occasionally  . Drug use: No  . Sexual activity: Not on file    Comment: single female partner  Lifestyle  . Physical activity:    Days per week: Not on file    Minutes per session: Not on file  . Stress: Not on file  Relationships  . Social connections:    Talks on phone: Not on file    Gets together: Not on file    Attends religious service: Not on file    Active member of club or organization: Not on file    Attends meetings of clubs or organizations: Not on file    Relationship status: Not on file  . Intimate partner violence:    Fear of current or ex partner: Not on file    Emotionally abused: Not on file    Physically abused: Not on file    Forced sexual activity: Not on file  Other Topics Concern  . Not on file  Social History Narrative  . Not on file     Review of Systems  All other systems reviewed and are negative.      Objective:   Physical Exam  Constitutional: She appears well-developed and well-nourished. No distress.  HENT:  Right Ear: External ear normal.  Left Ear: External ear normal.  Nose: Nose normal.  Mouth/Throat: Oropharynx is clear and moist. No oropharyngeal exudate.  Eyes: Conjunctivae are normal.  Neck: Neck supple. No JVD present.  Cardiovascular: Normal rate, regular rhythm and normal heart sounds. Exam reveals no gallop and no friction  rub.  No murmur heard. Pulmonary/Chest: Effort normal. No respiratory distress. She has no wheezes. She has no rales. She exhibits no tenderness.  Abdominal: Soft. Bowel sounds are normal. She exhibits no distension. There is no tenderness. There is no rebound and no guarding.  Musculoskeletal: She exhibits no edema.       Right  hip: She exhibits decreased range of motion.       Left hip: She exhibits decreased range of motion.       Right knee: She exhibits decreased range of motion and abnormal meniscus. Tenderness found. Medial joint line and lateral joint line tenderness noted.  Lymphadenopathy:    She has no cervical adenopathy.  Skin: She is not diaphoretic.  Vitals reviewed.         Assessment & Plan:  Controlled type 2 diabetes mellitus with complication, without long-term current use of insulin ([facility]) - Plan: Hemoglobin A1c, CBC with Differential/Platelet, COMPLETE METABOLIC PANEL WITH GFR, Lipid panel  Pure hypercholesterolemia  I will check a hemoglobin A1c today.  If hemoglobin A1c is improving on Januvia line which the patient has been taking since early August in addition to her weight loss and diet changes, we may just keep the patient on Januvia by itself.  If hemoglobin A1c is still significantly elevated, I would recommend continuing metformin and replacing with another alternative.  Options may include glipizide, Actos, or namebrand medication such as Jardiance or Victoza.  Other medical problems, I would likely recommend Victoza.  Regarding surgical clearance, the patient has no evidence of unstable angina or uncontrolled congestive heart failure.  She continues to smoke however she is working on smoking cessation.  This would slightly increase her surgical risk.  Her hemoglobin A1c was 8.4 in August.  I will check another one today however her hemoglobin A1c may not be below 7.5 yet.  I feel her surgical risk is moderate given her history of smoking, diabetes, and COPD.  However I do not feel that the patient requires specialty clearance.  She is medically cleared to proceed with surgery.

## 2017-11-28 LAB — COMPLETE METABOLIC PANEL WITH GFR
AG RATIO: 1.9 (calc) (ref 1.0–2.5)
ALBUMIN MSPROF: 4.6 g/dL (ref 3.6–5.1)
ALKALINE PHOSPHATASE (APISO): 49 U/L (ref 33–130)
ALT: 21 U/L (ref 6–29)
AST: 18 U/L (ref 10–35)
BUN / CREAT RATIO: 15 (calc) (ref 6–22)
BUN: 16 mg/dL (ref 7–25)
CALCIUM: 10.2 mg/dL (ref 8.6–10.4)
CO2: 22 mmol/L (ref 20–32)
Chloride: 102 mmol/L (ref 98–110)
Creat: 1.04 mg/dL — ABNORMAL HIGH (ref 0.60–0.93)
GFR, EST NON AFRICAN AMERICAN: 54 mL/min/{1.73_m2} — AB (ref >=60)
GFR, Est African American: 63 mL/min/{1.73_m2} (ref >=60)
GLOBULIN: 2.4 g/dL (ref 1.9–3.7)
Glucose, Bld: 101 mg/dL — ABNORMAL HIGH (ref 65–99)
POTASSIUM: 3.9 mmol/L (ref 3.5–5.3)
SODIUM: 139 mmol/L (ref 135–146)
Total Bilirubin: 0.7 mg/dL (ref 0.2–1.2)
Total Protein: 7 g/dL (ref 6.1–8.1)

## 2017-11-28 LAB — LIPID PANEL
Cholesterol: 150 mg/dL (ref <200)
HDL: 38 mg/dL — AB (ref >50)
LDL Cholesterol (Calc): 80 mg/dL (calc)
NON-HDL CHOLESTEROL (CALC): 112 mg/dL (ref <130)
Total CHOL/HDL Ratio: 3.9 (calc) (ref <5.0)
Triglycerides: 219 mg/dL — ABNORMAL HIGH (ref <150)

## 2017-11-28 LAB — CBC WITH DIFFERENTIAL/PLATELET
BASOS ABS: 144 {cells}/uL (ref 0–200)
Basophils Relative: 1.4 %
Eosinophils Absolute: 536 cells/uL — ABNORMAL HIGH (ref 15–500)
Eosinophils Relative: 5.2 %
HEMATOCRIT: 46.1 % — AB (ref 35.0–45.0)
Hemoglobin: 15.9 g/dL — ABNORMAL HIGH (ref 11.7–15.5)
LYMPHS ABS: 3780 {cells}/uL (ref 850–3900)
MCH: 29.8 pg (ref 27.0–33.0)
MCHC: 34.5 g/dL (ref 32.0–36.0)
MCV: 86.3 fL (ref 80.0–100.0)
MPV: 11.9 fL (ref 7.5–12.5)
Monocytes Relative: 8.2 %
NEUTROS PCT: 48.5 %
Neutro Abs: 4996 cells/uL (ref 1500–7800)
Platelets: 223 10*3/uL (ref 140–400)
RBC: 5.34 10*6/uL — ABNORMAL HIGH (ref 3.80–5.10)
RDW: 12.4 % (ref 11.0–15.0)
Total Lymphocyte: 36.7 %
WBC: 10.3 10*3/uL (ref 3.8–10.8)
WBCMIX: 845 {cells}/uL (ref 200–950)

## 2017-11-28 LAB — HEMOGLOBIN A1C
Hgb A1c MFr Bld: 7.4 % of total Hgb — ABNORMAL HIGH (ref <5.7)
Mean Plasma Glucose: 166 (calc)
eAG (mmol/L): 9.2 (calc)

## 2017-11-30 ENCOUNTER — Other Ambulatory Visit: Payer: Self-pay | Admitting: Family Medicine

## 2017-11-30 MED ORDER — PIOGLITAZONE HCL 30 MG PO TABS: 30.0000 mg | ORAL_TABLET | Freq: Every day | ORAL | 1 refills | Status: DC

## 2017-12-09 NOTE — Patient Instructions (Addendum)
Kathleen Ramirez  12/09/2017   Your procedure is scheduled on: 12-22-17     Report to [facility] Main  Entrance    Report to Admitting at 6:10 AM    Call this number if you have problems the morning of surgery [phone]     Remember: Do not eat food or drink liquids :After Midnight.   BRUSH YOUR TEETH MORNING OF SURGERY AND RINSE YOUR MOUTH OUT, NO CHEWING GUM CANDY OR MINTS.     Take these medicines the morning of surgery with A SIP OF WATER: None.  You may bring and use your inhaler as needed.   DO NOT TAKE ANY DIABETIC MEDICATIONS DAY OF YOUR SURGERY                               You may not have any metal on your body including hair pins and              piercings  Do not wear jewelry, make-up, lotions, powders or perfumes, deodorant             Do not wear nail polish.  Do not shave  48 hours prior to surgery.     Do not bring valuables to the hospital. [place].  Contacts, dentures or bridgework may not be worn into surgery.  Leave suitcase in the car. After surgery it may be brought to your room.   Special Instructions: N/A              Please read over the following fact sheets you were given: _____________________________________________________________________             How to Manage Your Diabetes Before and After Surgery  Why is it important to control my blood sugar before and after surgery? . Improving blood sugar levels before and after surgery helps healing and can limit problems. . A way of improving blood sugar control is eating a healthy diet by: o  Eating less sugar and carbohydrates o  Increasing activity/exercise o  Talking with your doctor about reaching your blood sugar goals . High blood sugars (greater than 180 mg/dL) can raise your risk of infections and slow your recovery, so you will need to focus on controlling your diabetes during the weeks before surgery. . Make  sure that the doctor who takes care of your diabetes knows about your planned surgery including the date and location.  How do I manage my blood sugar before surgery? . Check your blood sugar at least 4 times a day, starting 2 days before surgery, to make sure that the level is not too high or low. o Check your blood sugar the morning of your surgery when you wake up and every 2 hours until you get to the Short Stay unit. . If your blood sugar is less than 70 mg/dL, you will need to treat for low blood sugar: o Do not take insulin. o Treat a low blood sugar (less than 70 mg/dL) with  cup of clear juice (cranberry or apple), 4 glucose tablets, OR glucose gel. o Recheck blood sugar in 15 minutes after treatment (to make sure it is greater than 70 mg/dL). If your blood sugar is not greater than 70 mg/dL  on recheck, call [phone] for further instructions. . Report your blood sugar to the short stay nurse when you get to Short Stay.  . If you are admitted to the hospital after surgery: o Your blood sugar will be checked by the staff and you will probably be given insulin after surgery (instead of oral diabetes medicines) to make sure you have good blood sugar levels. o The goal for blood sugar control after surgery is 80-180 mg/dL.   WHAT DO I DO ABOUT MY DIABETES MEDICATION?  Marland Kitchen Do not take oral diabetes medicines (pills) the morning of surgery.  . THE DAY BEFORE SURGERY, take your usual dose of Januvia.                 [facility] - Preparing for Surgery Before surgery, you can play an important role.  Because skin is not sterile, your skin needs to be as free of germs as possible.  You can reduce the number of germs on your skin by washing with CHG (chlorahexidine gluconate) soap before surgery.  CHG is an antiseptic cleaner which kills germs and bonds with the skin to continue killing germs even after washing. Please DO NOT use if you have an allergy to CHG or antibacterial soaps.  If  your skin becomes reddened/irritated stop using the CHG and inform your nurse when you arrive at Short Stay. Do not shave (including legs and underarms) for at least 48 hours prior to the first CHG shower.  You may shave your face/neck. Please follow these instructions carefully:  1.  Shower with CHG Soap the night before surgery and the  morning of Surgery.  2.  If you choose to wash your hair, wash your hair first as usual with your  normal  shampoo.  3.  After you shampoo, rinse your hair and body thoroughly to remove the  shampoo.                           4.  Use CHG as you would any other liquid soap.  You can apply chg directly  to the skin and wash                       Gently with a scrungie or clean washcloth.  5.  Apply the CHG Soap to your body ONLY FROM THE NECK DOWN.   Do not use on face/ open                           Wound or open sores. Avoid contact with eyes, ears mouth and genitals (private parts).                       Wash face,  Genitals (private parts) with your normal soap.             6.  Wash thoroughly, paying special attention to the area where your surgery  will be performed.  7.  Thoroughly rinse your body with warm water from the neck down.  8.  DO NOT shower/wash with your normal soap after using and rinsing off  the CHG Soap.                9.  Pat yourself dry with a clean towel.            10.  Wear clean pajamas.  11.  Place clean sheets on your bed the night of your first shower and do not  sleep with pets. Day of Surgery : Do not apply any lotions/deodorants the morning of surgery.  Please wear clean clothes to the hospital/surgery center.  FAILURE TO FOLLOW THESE INSTRUCTIONS MAY RESULT IN THE CANCELLATION OF YOUR SURGERY PATIENT SIGNATURE_________________________________  NURSE SIGNATURE__________________________________  ________________________________________________________________________   Adam Phenix  An incentive  spirometer is a tool that can help keep your lungs clear and active. This tool measures how well you are filling your lungs with each breath. Taking long deep breaths may help reverse or decrease the chance of developing breathing (pulmonary) problems (especially infection) following:  A long period of time when you are unable to move or be active. BEFORE THE PROCEDURE   If the spirometer includes an indicator to show your best effort, your nurse or respiratory therapist will set it to a desired goal.  If possible, sit up straight or lean slightly forward. Try not to slouch.  Hold the incentive spirometer in an upright position. INSTRUCTIONS FOR USE  1. Sit on the edge of your bed if possible, or sit up as far as you can in bed or on a chair. 2. Hold the incentive spirometer in an upright position. 3. Breathe out normally. 4. Place the mouthpiece in your mouth and seal your lips tightly around it. 5. Breathe in slowly and as deeply as possible, raising the piston or the ball toward the top of the column. 6. Hold your breath for 3-5 seconds or for as long as possible. Allow the piston or ball to fall to the bottom of the column. 7. Remove the mouthpiece from your mouth and breathe out normally. 8. Rest for a few seconds and repeat Steps 1 through 7 at least 10 times every 1-2 hours when you are awake. Take your time and take a few normal breaths between deep breaths. 9. The spirometer may include an indicator to show your best effort. Use the indicator as a goal to work toward during each repetition. 10. After each set of 10 deep breaths, practice coughing to be sure your lungs are clear. If you have an incision (the cut made at the time of surgery), support your incision when coughing by placing a pillow or rolled up towels firmly against it. Once you are able to get out of bed, walk around indoors and cough well. You may stop using the incentive spirometer when instructed by your caregiver.   RISKS AND COMPLICATIONS  Take your time so you do not get dizzy or light-headed.  If you are in pain, you may need to take or ask for pain medication before doing incentive spirometry. It is harder to take a deep breath if you are having pain. AFTER USE  Rest and breathe slowly and easily.  It can be helpful to keep track of a log of your progress. Your caregiver can provide you with a simple table to help with this. If you are using the spirometer at home, follow these instructions: [place] IF:   You are having difficultly using the spirometer.  You have trouble using the spirometer as often as instructed.  Your pain medication is not giving enough relief while using the spirometer.  You develop fever of 100.5 F (38.1 C) or higher. SEEK IMMEDIATE MEDICAL CARE IF:   You cough up bloody sputum that had not been present before.  You develop fever of 102 F (38.9 C)  or greater.  You develop worsening pain at or near the incision site. MAKE SURE YOU:   Understand these instructions.  Will watch your condition.  Will get help right away if you are not doing well or get worse. Document Released: 06/16/2006 Document Revised: 04/28/2011 Document Reviewed: 08/17/2006 Exit[employer] Patient Information 2014 Exit[employer], [place].   ________________________________________________________________________  WHAT IS A BLOOD TRANSFUSION? Blood Transfusion Information  A transfusion is the replacement of blood or some of its parts. Blood is made up of multiple cells which provide different functions.  Red blood cells carry oxygen and are used for blood loss replacement.  White blood cells fight against infection.  Platelets control bleeding.  Plasma helps clot blood.  Other blood products are available for specialized needs, such as hemophilia or other clotting disorders. BEFORE THE TRANSFUSION  Who gives blood for transfusions?   Healthy volunteers who are fully evaluated  to make sure their blood is safe. This is blood bank blood. Transfusion therapy is the safest it has ever been in the practice of medicine. Before blood is taken from a donor, a complete history is taken to make sure that person has no history of diseases nor engages in risky social behavior (examples are intravenous drug use or sexual activity with multiple partners). The donor's travel history is screened to minimize risk of transmitting infections, such as malaria. The donated blood is tested for signs of infectious diseases, such as HIV and hepatitis. The blood is then tested to be sure it is compatible with you in order to minimize the chance of a transfusion reaction. If you or a relative donates blood, this is often done in anticipation of surgery and is not appropriate for emergency situations. It takes many days to process the donated blood. RISKS AND COMPLICATIONS Although transfusion therapy is very safe and saves many lives, the main dangers of transfusion include:   Getting an infectious disease.  Developing a transfusion reaction. This is an allergic reaction to something in the blood you were given. Every precaution is taken to prevent this. The decision to have a blood transfusion has been considered carefully by your caregiver before blood is given. Blood is not given unless the benefits outweigh the risks. AFTER THE TRANSFUSION  Right after receiving a blood transfusion, you will usually feel much better and more energetic. This is especially true if your red blood cells have gotten low (anemic). The transfusion raises the level of the red blood cells which carry oxygen, and this usually causes an energy increase.  The nurse administering the transfusion will monitor you carefully for complications. HOME CARE INSTRUCTIONS  No special instructions are needed after a transfusion. You may find your energy is better. Speak with your caregiver about any limitations on activity for  underlying diseases you may have. SEEK MEDICAL CARE IF:   Your condition is not improving after your transfusion.  You develop redness or irritation at the intravenous (IV) site. SEEK IMMEDIATE MEDICAL CARE IF:  Any of the following symptoms occur over the next 12 hours:  Shaking chills.  You have a temperature by mouth above 102 F (38.9 C), not controlled by medicine.  Chest, back, or muscle pain.  People around you feel you are not acting correctly or are confused.  Shortness of breath or difficulty breathing.  Dizziness and fainting.  You get a rash or develop hives.  You have a decrease in urine output.  Your urine turns a dark color or changes to pink, red,  or brown. Any of the following symptoms occur over the next 10 days:  You have a temperature by mouth above 102 F (38.9 C), not controlled by medicine.  Shortness of breath.  Weakness after normal activity.  The white part of the eye turns yellow (jaundice).  You have a decrease in the amount of urine or are urinating less often.  Your urine turns a dark color or changes to pink, red, or brown. Document Released: 02/01/2000 Document Revised: 04/28/2011 Document Reviewed: 09/20/2007 [facility] Patient Information 2014 [place], [place].  _______________________________________________________________________

## 2017-12-09 NOTE — Progress Notes (Signed)
12-03-17 Surgical clearance on chart  11-27-17 (Epic) HGA1C,  CBC w/Diff, CMP

## 2017-12-16 ENCOUNTER — Other Ambulatory Visit: Payer: Self-pay

## 2017-12-16 ENCOUNTER — Encounter (HOSPITAL_COMMUNITY): Payer: Self-pay

## 2017-12-16 ENCOUNTER — Encounter (HOSPITAL_COMMUNITY)
Admission: RE | Admit: 2017-12-16 | Discharge: 2017-12-16 | Disposition: A | Discharge status: Home / Self Care | Payer: Medicare Other | Source: Ambulatory Visit | Attending: Orthopedic Surgery | Admitting: Orthopedic Surgery

## 2017-12-16 DIAGNOSIS — Z79899 Other long term (current) drug therapy: Secondary | ICD-10-CM | POA: Diagnosis not present

## 2017-12-16 DIAGNOSIS — Z7951 Long term (current) use of inhaled steroids: Secondary | ICD-10-CM | POA: Diagnosis not present

## 2017-12-16 DIAGNOSIS — Z01818 Encounter for other preprocedural examination: Secondary | ICD-10-CM | POA: Insufficient documentation

## 2017-12-16 DIAGNOSIS — M1611 Unilateral primary osteoarthritis, right hip: Secondary | ICD-10-CM | POA: Diagnosis not present

## 2017-12-16 DIAGNOSIS — Z791 Long term (current) use of non-steroidal anti-inflammatories (NSAID): Secondary | ICD-10-CM | POA: Insufficient documentation

## 2017-12-16 LAB — SURGICAL PCR SCREEN
MRSA, PCR: NEGATIVE
STAPHYLOCOCCUS AUREUS: NEGATIVE

## 2017-12-16 LAB — GLUCOSE, CAPILLARY: GLUCOSE-CAPILLARY: 136 mg/dL — AB (ref 70–99)

## 2017-12-16 LAB — ABO/RH: ABO/RH(D): A POS

## 2017-12-16 NOTE — H&P (Signed)
TOTAL HIP ADMISSION H&P  Patient is admitted for right total hip arthroplasty, anterior approach.  Subjective:  Chief Complaint:   Right hip primary OA / pain  HPI: Kathleen Ramirez, 72 y.o. female, has a history of pain and functional disability in the right hip(s) due to arthritis and patient has failed non-surgical conservative treatments for greater than 12 weeks to include NSAID's and/or analgesics, use of assistive devices and activity modification.  Onset of symptoms was gradual starting 2 years ago with gradually worsening course since that time.The patient noted no past surgery on the right hip(s).  Patient currently rates pain in the right hip at 10 out of 10 with activity. Patient has night pain, worsening of pain with activity and weight bearing, trendelenberg gait, pain that interfers with activities of daily living and pain with passive range of motion. Patient has evidence of periarticular osteophytes and joint space narrowing by imaging studies. This condition presents safety issues increasing the risk of falls.  There is no current active infection.  Risks, benefits and expectations were discussed with the patient.  Risks including but not limited to the risk of anesthesia, blood clots, nerve damage, blood vessel damage, failure of the prosthesis, infection and up to and including death.  Patient understand the risks, benefits and expectations and wishes to proceed with surgery.   PCP: Susy Frizzle, MD  D/C Plans:       Home  Post-op Meds:       No Rx given   Tranexamic Acid:      To be given - IV   Decadron:      Is to be given  FYI:      ASA  Norco (ok per pt)  DME:   Rx given for - RW & 3-n-1  PT:   No PT    Patient Active Problem List   Diagnosis Date Noted  . AORTIC STENOSIS, MILD 09/02/2009  . [place], BREAST 08/25/2009  . OSTEOARTHRITIS, HANDS, 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  . VAGINITIS, CANDIDAL 02/20/2009  . URI 02/20/2009  . GANGLION CYST, WRIST, RIGHT  10/20/2008  . Diabetes ([place]) 09/08/2008  . HYPERLIPIDEMIA, MIXED 08/23/2008  . POLYCYTHEMIA, SECONDARY 08/23/2008  . TOBACCO ABUSE 08/11/2008  . TROCHANTERIC BURSITIS, RIGHT 08/11/2008  . COLONIC POLYPS, ADENOMATOUS, HX OF 08/11/2008  . TINEA CORPORIS 01/28/2008  . DEPRESSION/ANXIETY 01/28/2008  . MACULAR DEGENERATION 01/28/2008  . Extrinsic asthma 01/28/2008  . COPD (chronic obstructive pulmonary disease) ([place]) 01/28/2008  . FREQUENCY, URINARY 01/28/2008  . SHOULDER PAIN, LEFT 05/05/2003  . CHEST PAIN 11/18/2002  . DIVERTICULOSIS OF COLON 12/10/2001   Past Medical History:  Diagnosis Date  . Bilateral ovarian tumors   . Cancer ([place])   . Cervical cancer ([place])   . Diabetes mellitus without complication ([place])   . Dysplastic colon polyp   . HLD (hyperlipidemia)   . Smoking   . Tumors    5tumors removed  . UTI (lower urinary tract infection)   . Yeast vaginitis     Past Surgical History:  Procedure Laterality Date  . ABDOMINAL HYSTERECTOMY    . APPENDECTOMY    . BLADDER NECK SUSPENSION    . BLADDER SURGERY    . BREAST LUMPECTOMY    . oophrectomy    . ORIF ULNAR FRACTURE  10/19/2011   Procedure: OPEN REDUCTION INTERNAL FIXATION (ORIF) ULNAR FRACTURE;  Surgeon: Linna Hoff, MD;  Location: [facility];  Service: Orthopedics;  Laterality: Right;  . TONSILLECTOMY    . TUBAL LIGATION    .  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 0.9 %  sodium chloride infusion  500 mL Intravenous Continuous Irene Shipper, MD       Current Outpatient Medications  Medication Sig Dispense Refill Last Dose  . albuterol (PROVENTIL HFA;VENTOLIN HFA) 108 (90 Base) MCG/ACT inhaler INHALE 2 PUFFS BY MOUTH EVERY 6 HOURS AS NEEDED FOR WHEEZING& SHORTNESS OF BREATH (Patient taking differently: Inhale 2 puffs into the lungs every 6 (six) hours as needed for wheezing or  shortness of breath. ) 18 g 0 Taking  . atorvastatin (LIPITOR) 20 MG tablet TAKE 1 TABLET(20 MG) BY MOUTH DAILY (Patient taking differently: Take 20 mg by mouth daily. ) 90 tablet 2 Taking  . CINNAMON PO Take 1 capsule by mouth daily.     . diclofenac (VOLTAREN) 75 MG EC tablet TAKE 1 TABLET(75 MG) BY MOUTH TWICE DAILY (Patient not taking: No sig reported) 60 tablet 0 Not Taking at Unknown time  . JANUVIA 100 MG tablet TAKE 1 TABLET(100 MG) BY MOUTH DAILY (Patient taking differently: Take 100 mg by mouth daily. ) 90 tablet 3 Taking  . Lactobacillus (PROBIOTIC ACIDOPHILUS PO) Take 1 capsule by mouth daily.     . meloxicam (MOBIC) 7.5 MG tablet Take 1 tablet by mouth daily as needed for pain.     . SYMBICORT 160-4.5 MCG/ACT inhaler INHALE 2 PUFFS INTO THE LUNGS TWICE DAILY (Patient taking differently: Inhale 2 puffs into the lungs 2 (two) times daily as needed (asthma). ) 10.2 g 5 Taking  . pioglitazone (ACTOS) 30 MG tablet Take 1 tablet (30 mg total) by mouth daily. (Patient taking differently: Take 30 mg by mouth daily. ) 90 tablet 1    Allergies  Allergen Reactions  . Codeine Nausea And Vomiting     makes heart race    Social History   Tobacco Use  . Smoking status: Former Smoker    Packs/day: 0.00    Types: Cigarettes    Last attempt to quit: 11/13/2017    Years since quitting: 0.0  . Smokeless tobacco: Never Used  Substance Use Topics  . Alcohol use: Yes    Comment: occasionally    Family History  Problem Relation Age of Onset  . Heart disease Mother      Review of Systems  Constitutional: Negative.   HENT: Negative.   Eyes: Negative.   Respiratory: Negative.   Cardiovascular: Negative.   Gastrointestinal: Negative.   Genitourinary: Negative.   Musculoskeletal: Positive for joint pain.  Skin: Negative.   Neurological: Negative.   Endo/Heme/Allergies: Negative.   Psychiatric/Behavioral: Negative.     Objective:  Physical Exam  Constitutional: She is oriented to  person, place, and time. She appears well-developed.  HENT:  Head: Normocephalic.  Eyes: Pupils are equal, round, and reactive to light.  Neck: Neck supple. No JVD present. No tracheal deviation present. No thyromegaly present.  Cardiovascular: Normal rate, regular rhythm and intact distal pulses.  Respiratory: Effort normal and breath sounds normal. No respiratory distress. She has no wheezes.  GI: Soft. There is no tenderness. There is no guarding.  Musculoskeletal:       Right hip: She exhibits decreased range of motion, decreased strength, tenderness and bony tenderness. She exhibits no swelling, no deformity and no laceration.  Lymphadenopathy:    She has no cervical adenopathy.  Neurological: She is alert and oriented to  person, place, and time.  Skin: Skin is warm and dry.  Psychiatric: She has a normal mood and affect.    Vital signs in last 24 hours: Temp:  [98 F (36.7 C)] 98 F (36.7 C) (10/30 1203) Pulse Rate:  [61] 61 (10/30 1203) Resp:  [18] 18 (10/30 1203) BP: (146)/(72) 146/72 (10/30 1203) SpO2:  [99 %] 99 % (10/30 1203) Weight:  [77.3 kg] 77.3 kg (10/30 1203)  Labs:   Estimated body mass index is 28.35 kg/m as calculated from the following:   Height as of 12/16/17: 5\' 5"  (1.651 m).   Weight as of 12/16/17: 77.3 kg.   Imaging Review Plain radiographs demonstrate severe degenerative joint disease of the right hip. The bone quality appears to be good for age and reported activity level.    Preoperative templating of the joint replacement has been completed, documented, and submitted to the Operating Room personnel in order to optimize intra-operative equipment management.     Assessment/Plan:  End stage arthritis, right hip  The patient history, physical examination, clinical judgement of the provider and imaging studies are consistent with end stage degenerative joint disease of the right hip and total hip arthroplasty is deemed medically necessary.  The treatment options including medical management, injection therapy, arthroscopy and arthroplasty were discussed at length. The risks and benefits of total hip arthroplasty were presented and reviewed. The risks due to aseptic loosening, infection, stiffness, dislocation/subluxation,  thromboembolic complications and other imponderables were discussed.  The patient acknowledged the explanation, agreed to proceed with the plan and consent was signed. Patient is being admitted for inpatient treatment for surgery, pain control, PT, OT, prophylactic antibiotics, VTE prophylaxis, progressive ambulation and ADL's and discharge planning.The patient is planning to be discharged home.     [place] Donalee Gaumond   PA-C  12/16/2017, 2:43 PM

## 2017-12-21 DIAGNOSIS — M1611 Unilateral primary osteoarthritis, right hip: Secondary | ICD-10-CM | POA: Diagnosis not present

## 2017-12-22 ENCOUNTER — Inpatient Hospital Stay (HOSPITAL_COMMUNITY)
Admission: RE | Admit: 2017-12-22 | Discharge: 2017-12-23 | DRG: 470 | Disposition: A | Discharge status: Home / Self Care | Payer: Medicare Other | Source: Ambulatory Visit | Attending: Orthopedic Surgery | Admitting: Orthopedic Surgery

## 2017-12-22 ENCOUNTER — Inpatient Hospital Stay (HOSPITAL_COMMUNITY): Payer: Medicare Other

## 2017-12-22 ENCOUNTER — Other Ambulatory Visit: Payer: Self-pay

## 2017-12-22 ENCOUNTER — Encounter (HOSPITAL_COMMUNITY): Payer: Self-pay | Admitting: Emergency Medicine

## 2017-12-22 ENCOUNTER — Inpatient Hospital Stay (HOSPITAL_COMMUNITY): Payer: Medicare Other | Admitting: Anesthesiology

## 2017-12-22 ENCOUNTER — Encounter (HOSPITAL_COMMUNITY)
Admission: RE | Disposition: A | Discharge status: Home / Self Care | Payer: Self-pay | Source: Ambulatory Visit | Attending: Orthopedic Surgery

## 2017-12-22 DIAGNOSIS — E782 Mixed hyperlipidemia: Secondary | ICD-10-CM | POA: Diagnosis not present

## 2017-12-22 DIAGNOSIS — Z885 Allergy status to narcotic agent status: Secondary | ICD-10-CM | POA: Diagnosis not present

## 2017-12-22 DIAGNOSIS — Z419 Encounter for procedure for purposes other than remedying health state, unspecified: Secondary | ICD-10-CM

## 2017-12-22 DIAGNOSIS — Z9089 Acquired absence of other organs: Secondary | ICD-10-CM

## 2017-12-22 DIAGNOSIS — Z8541 Personal history of malignant neoplasm of cervix uteri: Secondary | ICD-10-CM | POA: Diagnosis not present

## 2017-12-22 DIAGNOSIS — E663 Overweight: Secondary | ICD-10-CM | POA: Diagnosis not present

## 2017-12-22 DIAGNOSIS — E119 Type 2 diabetes mellitus without complications: Secondary | ICD-10-CM | POA: Diagnosis present

## 2017-12-22 DIAGNOSIS — J449 Chronic obstructive pulmonary disease, unspecified: Secondary | ICD-10-CM | POA: Diagnosis present

## 2017-12-22 DIAGNOSIS — Z96641 Presence of right artificial hip joint: Secondary | ICD-10-CM | POA: Diagnosis not present

## 2017-12-22 DIAGNOSIS — Z87891 Personal history of nicotine dependence: Secondary | ICD-10-CM | POA: Diagnosis not present

## 2017-12-22 DIAGNOSIS — I350 Nonrheumatic aortic (valve) stenosis: Secondary | ICD-10-CM | POA: Diagnosis not present

## 2017-12-22 DIAGNOSIS — Z96649 Presence of unspecified artificial hip joint: Secondary | ICD-10-CM

## 2017-12-22 DIAGNOSIS — M1611 Unilateral primary osteoarthritis, right hip: Secondary | ICD-10-CM | POA: Diagnosis not present

## 2017-12-22 DIAGNOSIS — M19042 Primary osteoarthritis, left hand: Secondary | ICD-10-CM | POA: Diagnosis present

## 2017-12-22 DIAGNOSIS — E785 Hyperlipidemia, unspecified: Secondary | ICD-10-CM | POA: Diagnosis not present

## 2017-12-22 DIAGNOSIS — Z471 Aftercare following joint replacement surgery: Secondary | ICD-10-CM | POA: Diagnosis not present

## 2017-12-22 DIAGNOSIS — Z6829 Body mass index (BMI) 29.0-29.9, adult: Secondary | ICD-10-CM

## 2017-12-22 DIAGNOSIS — Z90710 Acquired absence of both cervix and uterus: Secondary | ICD-10-CM

## 2017-12-22 DIAGNOSIS — K5730 Diverticulosis of large intestine without perforation or abscess without bleeding: Secondary | ICD-10-CM | POA: Diagnosis not present

## 2017-12-22 DIAGNOSIS — M19041 Primary osteoarthritis, right hand: Secondary | ICD-10-CM | POA: Diagnosis not present

## 2017-12-22 DIAGNOSIS — Z8249 Family history of ischemic heart disease and other diseases of the circulatory system: Secondary | ICD-10-CM

## 2017-12-22 DIAGNOSIS — Z8719 Personal history of other diseases of the digestive system: Secondary | ICD-10-CM | POA: Diagnosis not present

## 2017-12-22 HISTORY — PX: TOTAL HIP ARTHROPLASTY: SHX124

## 2017-12-22 LAB — GLUCOSE, CAPILLARY
GLUCOSE-CAPILLARY: 134 mg/dL — AB (ref 70–99)
GLUCOSE-CAPILLARY: 131 mg/dL — AB (ref 70–99)
GLUCOSE-CAPILLARY: 310 mg/dL — AB (ref 70–99)
Glucose-Capillary: 232 mg/dL — ABNORMAL HIGH (ref 70–99)

## 2017-12-22 LAB — TYPE AND SCREEN
ABO/RH(D): A POS
Antibody Screen: NEGATIVE

## 2017-12-22 SURGERY — ARTHROPLASTY, HIP, TOTAL, ANTERIOR APPROACH
Anesthesia: Spinal | Site: Hip | Laterality: Right

## 2017-12-22 MED ORDER — PHENYLEPHRINE HCL 10 MG/ML IJ SOLN
INTRAMUSCULAR | Status: AC
  Filled 2017-12-22: qty 1

## 2017-12-22 MED ORDER — METOCLOPRAMIDE HCL 5 MG PO TABS: 5.0000 mg | ORAL_TABLET | Freq: Three times a day (TID) | ORAL | Status: DC | PRN

## 2017-12-22 MED ORDER — ASPIRIN 81 MG PO CHEW
81.0000 mg | CHEWABLE_TABLET | Freq: Two times a day (BID) | ORAL | Status: DC
  Administered 2017-12-22 – 2017-12-23 (×2): 81 mg via ORAL
  Filled 2017-12-22 (×2): qty 1

## 2017-12-22 MED ORDER — ONDANSETRON HCL 4 MG/2ML IJ SOLN: 4.0000 mg | Freq: Once | INTRAMUSCULAR | Status: DC | PRN

## 2017-12-22 MED ORDER — CELECOXIB 200 MG PO CAPS
200.0000 mg | ORAL_CAPSULE | Freq: Two times a day (BID) | ORAL | Status: DC
  Administered 2017-12-22 – 2017-12-23 (×2): 200 mg via ORAL
  Filled 2017-12-22 (×2): qty 1

## 2017-12-22 MED ORDER — BISACODYL 10 MG RE SUPP: 10.0000 mg | Freq: Every day | RECTAL | Status: DC | PRN

## 2017-12-22 MED ORDER — ALUM & MAG HYDROXIDE-SIMETH 200-200-20 MG/5ML PO SUSP: 15.0000 mL | ORAL | Status: DC | PRN

## 2017-12-22 MED ORDER — DEXAMETHASONE SODIUM PHOSPHATE 10 MG/ML IJ SOLN
10.0000 mg | Freq: Once | INTRAMUSCULAR | Status: AC
  Administered 2017-12-23: 10 mg via INTRAVENOUS
  Filled 2017-12-22: qty 1

## 2017-12-22 MED ORDER — POLYETHYLENE GLYCOL 3350 17 G PO PACK: 17.0000 g | PACK | Freq: Two times a day (BID) | ORAL | 0 refills | Status: DC

## 2017-12-22 MED ORDER — SODIUM CHLORIDE 0.9 % IR SOLN
Status: DC | PRN
  Administered 2017-12-22: 1000 mL

## 2017-12-22 MED ORDER — TRANEXAMIC ACID-NACL 1000-0.7 MG/100ML-% IV SOLN
1000.0000 mg | Freq: Once | INTRAVENOUS | Status: AC
  Administered 2017-12-22: 1000 mg via INTRAVENOUS
  Filled 2017-12-22: qty 100

## 2017-12-22 MED ORDER — LACTATED RINGERS IV SOLN
INTRAVENOUS | Status: DC
  Administered 2017-12-22 (×2): via INTRAVENOUS

## 2017-12-22 MED ORDER — LINAGLIPTIN 5 MG PO TABS
5.0000 mg | ORAL_TABLET | Freq: Every day | ORAL | Status: DC
  Administered 2017-12-22: 5 mg via ORAL
  Filled 2017-12-22 (×2): qty 1

## 2017-12-22 MED ORDER — INSULIN ASPART 100 UNIT/ML ~~LOC~~ SOLN: 0.0000 [IU] | Freq: Three times a day (TID) | SUBCUTANEOUS | Status: DC

## 2017-12-22 MED ORDER — MOMETASONE FURO-FORMOTEROL FUM 200-5 MCG/ACT IN AERO: 2.0000 | INHALATION_SPRAY | Freq: Two times a day (BID) | RESPIRATORY_TRACT | Status: DC | PRN

## 2017-12-22 MED ORDER — ASPIRIN 81 MG PO CHEW: 81.0000 mg | CHEWABLE_TABLET | Freq: Two times a day (BID) | ORAL | 0 refills | Status: AC

## 2017-12-22 MED ORDER — TRANEXAMIC ACID-NACL 1000-0.7 MG/100ML-% IV SOLN
1000.0000 mg | INTRAVENOUS | Status: AC
  Administered 2017-12-22: 1000 mg via INTRAVENOUS
  Filled 2017-12-22: qty 100

## 2017-12-22 MED ORDER — HYDROCODONE-ACETAMINOPHEN 5-325 MG PO TABS
1.0000 | ORAL_TABLET | ORAL | Status: DC | PRN
  Administered 2017-12-22 – 2017-12-23 (×4): 2 via ORAL
  Filled 2017-12-22 (×4): qty 2

## 2017-12-22 MED ORDER — MEPERIDINE HCL 50 MG/ML IJ SOLN
INTRAMUSCULAR | Status: AC
  Filled 2017-12-22: qty 1

## 2017-12-22 MED ORDER — ATORVASTATIN CALCIUM 20 MG PO TABS
20.0000 mg | ORAL_TABLET | Freq: Every day | ORAL | Status: DC
  Administered 2017-12-22 – 2017-12-23 (×2): 20 mg via ORAL
  Filled 2017-12-22 (×2): qty 1

## 2017-12-22 MED ORDER — CHLORHEXIDINE GLUCONATE 4 % EX LIQD: 60.0000 mL | Freq: Once | CUTANEOUS | Status: DC

## 2017-12-22 MED ORDER — FERROUS SULFATE 325 (65 FE) MG PO TABS
325.0000 mg | ORAL_TABLET | Freq: Three times a day (TID) | ORAL | Status: DC
  Filled 2017-12-22: qty 1

## 2017-12-22 MED ORDER — PROPOFOL 10 MG/ML IV BOLUS
INTRAVENOUS | Status: AC
  Filled 2017-12-22: qty 40

## 2017-12-22 MED ORDER — EPHEDRINE SULFATE-NACL 50-0.9 MG/10ML-% IV SOSY
PREFILLED_SYRINGE | INTRAVENOUS | Status: DC | PRN
  Administered 2017-12-22: 5 mg via INTRAVENOUS

## 2017-12-22 MED ORDER — FENTANYL CITRATE (PF) 100 MCG/2ML IJ SOLN: 25.0000 ug | INTRAMUSCULAR | Status: DC | PRN

## 2017-12-22 MED ORDER — ONDANSETRON HCL 4 MG/2ML IJ SOLN: 4.0000 mg | Freq: Four times a day (QID) | INTRAMUSCULAR | Status: DC | PRN

## 2017-12-22 MED ORDER — SODIUM CHLORIDE 0.9 % IV SOLN
INTRAVENOUS | Status: DC
  Administered 2017-12-22 (×2): via INTRAVENOUS

## 2017-12-22 MED ORDER — ALBUTEROL SULFATE (2.5 MG/3ML) 0.083% IN NEBU: 2.5000 mg | INHALATION_SOLUTION | Freq: Four times a day (QID) | RESPIRATORY_TRACT | Status: DC | PRN

## 2017-12-22 MED ORDER — PHENYLEPHRINE HCL 10 MG/ML IJ SOLN
INTRAVENOUS | Status: DC | PRN
  Administered 2017-12-22: 25 ug/min via INTRAVENOUS

## 2017-12-22 MED ORDER — DEXAMETHASONE SODIUM PHOSPHATE 10 MG/ML IJ SOLN
INTRAMUSCULAR | Status: AC
  Filled 2017-12-22: qty 1

## 2017-12-22 MED ORDER — ONDANSETRON HCL 4 MG/2ML IJ SOLN
INTRAMUSCULAR | Status: AC
  Filled 2017-12-22: qty 2

## 2017-12-22 MED ORDER — STERILE WATER FOR IRRIGATION IR SOLN
Status: DC | PRN
  Administered 2017-12-22: 2000 mL

## 2017-12-22 MED ORDER — DEXAMETHASONE SODIUM PHOSPHATE 10 MG/ML IJ SOLN
10.0000 mg | Freq: Once | INTRAMUSCULAR | Status: AC
  Administered 2017-12-22: 10 mg via INTRAVENOUS

## 2017-12-22 MED ORDER — INSULIN ASPART 100 UNIT/ML ~~LOC~~ SOLN
0.0000 [IU] | Freq: Three times a day (TID) | SUBCUTANEOUS | Status: DC
  Administered 2017-12-22: 3 [IU] via SUBCUTANEOUS
  Administered 2017-12-22: 5 [IU] via SUBCUTANEOUS

## 2017-12-22 MED ORDER — HYDROCODONE-ACETAMINOPHEN 7.5-325 MG PO TABS: 1.0000 | ORAL_TABLET | ORAL | 0 refills | Status: DC | PRN

## 2017-12-22 MED ORDER — HYDROMORPHONE HCL 1 MG/ML IJ SOLN: 0.5000 mg | INTRAMUSCULAR | Status: DC | PRN

## 2017-12-22 MED ORDER — DIPHENHYDRAMINE HCL 12.5 MG/5ML PO ELIX: 12.5000 mg | ORAL_SOLUTION | ORAL | Status: DC | PRN

## 2017-12-22 MED ORDER — ONDANSETRON HCL 4 MG PO TABS
4.0000 mg | ORAL_TABLET | Freq: Four times a day (QID) | ORAL | Status: DC | PRN
  Administered 2017-12-23: 4 mg via ORAL
  Filled 2017-12-22: qty 1

## 2017-12-22 MED ORDER — METOCLOPRAMIDE HCL 5 MG/ML IJ SOLN: 5.0000 mg | Freq: Three times a day (TID) | INTRAMUSCULAR | Status: DC | PRN

## 2017-12-22 MED ORDER — HYDROCODONE-ACETAMINOPHEN 7.5-325 MG PO TABS
1.0000 | ORAL_TABLET | ORAL | Status: DC | PRN
  Administered 2017-12-22: 1 via ORAL
  Filled 2017-12-22: qty 1

## 2017-12-22 MED ORDER — CEFAZOLIN SODIUM-DEXTROSE 2-4 GM/100ML-% IV SOLN
2.0000 g | Freq: Four times a day (QID) | INTRAVENOUS | Status: AC
  Administered 2017-12-22 (×2): 2 g via INTRAVENOUS
  Filled 2017-12-22 (×2): qty 100

## 2017-12-22 MED ORDER — METHOCARBAMOL 500 MG PO TABS
500.0000 mg | ORAL_TABLET | Freq: Four times a day (QID) | ORAL | Status: DC | PRN
  Administered 2017-12-22 – 2017-12-23 (×3): 500 mg via ORAL
  Filled 2017-12-22 (×3): qty 1

## 2017-12-22 MED ORDER — DOCUSATE SODIUM 100 MG PO CAPS: 100.0000 mg | ORAL_CAPSULE | Freq: Two times a day (BID) | ORAL | 0 refills | Status: DC

## 2017-12-22 MED ORDER — MAGNESIUM CITRATE PO SOLN: 1.0000 | Freq: Once | ORAL | Status: DC | PRN

## 2017-12-22 MED ORDER — POLYETHYLENE GLYCOL 3350 17 G PO PACK
17.0000 g | PACK | Freq: Two times a day (BID) | ORAL | Status: DC
  Filled 2017-12-22 (×2): qty 1

## 2017-12-22 MED ORDER — METHOCARBAMOL 500 MG PO TABS: 500.0000 mg | ORAL_TABLET | Freq: Four times a day (QID) | ORAL | 0 refills | Status: DC | PRN

## 2017-12-22 MED ORDER — PHENOL 1.4 % MT LIQD: 1.0000 | OROMUCOSAL | Status: DC | PRN

## 2017-12-22 MED ORDER — OXYCODONE HCL 5 MG PO TABS: 5.0000 mg | ORAL_TABLET | Freq: Once | ORAL | Status: DC | PRN

## 2017-12-22 MED ORDER — PROPOFOL 10 MG/ML IV BOLUS
INTRAVENOUS | Status: AC
  Filled 2017-12-22: qty 20

## 2017-12-22 MED ORDER — FERROUS SULFATE 325 (65 FE) MG PO TABS: 325.0000 mg | ORAL_TABLET | Freq: Three times a day (TID) | ORAL | 3 refills | Status: DC

## 2017-12-22 MED ORDER — ACETAMINOPHEN 325 MG PO TABS: 325.0000 mg | ORAL_TABLET | Freq: Four times a day (QID) | ORAL | Status: DC | PRN

## 2017-12-22 MED ORDER — ONDANSETRON HCL 4 MG/2ML IJ SOLN
INTRAMUSCULAR | Status: DC | PRN
  Administered 2017-12-22: 4 mg via INTRAVENOUS

## 2017-12-22 MED ORDER — INSULIN ASPART 100 UNIT/ML ~~LOC~~ SOLN
0.0000 [IU] | Freq: Three times a day (TID) | SUBCUTANEOUS | Status: DC
  Administered 2017-12-22: 11 [IU] via SUBCUTANEOUS
  Administered 2017-12-23 (×2): 3 [IU] via SUBCUTANEOUS

## 2017-12-22 MED ORDER — CEFAZOLIN SODIUM-DEXTROSE 2-4 GM/100ML-% IV SOLN
2.0000 g | INTRAVENOUS | Status: AC
  Administered 2017-12-22: 2 g via INTRAVENOUS
  Filled 2017-12-22: qty 100

## 2017-12-22 MED ORDER — METHOCARBAMOL 500 MG IVPB - SIMPLE MED
500.0000 mg | Freq: Four times a day (QID) | INTRAVENOUS | Status: DC | PRN
  Filled 2017-12-22: qty 50

## 2017-12-22 MED ORDER — MENTHOL 3 MG MT LOZG: 1.0000 | LOZENGE | OROMUCOSAL | Status: DC | PRN

## 2017-12-22 MED ORDER — PROPOFOL 500 MG/50ML IV EMUL
INTRAVENOUS | Status: DC | PRN
  Administered 2017-12-22: 50 ug/kg/min via INTRAVENOUS

## 2017-12-22 MED ORDER — BUPIVACAINE IN DEXTROSE 0.75-8.25 % IT SOLN
INTRATHECAL | Status: DC | PRN
  Administered 2017-12-22: 1.6 mL via INTRATHECAL

## 2017-12-22 MED ORDER — PROPOFOL 10 MG/ML IV BOLUS
INTRAVENOUS | Status: DC | PRN
  Administered 2017-12-22 (×3): 10 mg via INTRAVENOUS

## 2017-12-22 MED ORDER — OXYCODONE HCL 5 MG/5ML PO SOLN: 5.0000 mg | Freq: Once | ORAL | Status: DC | PRN

## 2017-12-22 MED ORDER — MEPERIDINE HCL 50 MG/ML IJ SOLN
6.2500 mg | INTRAMUSCULAR | Status: DC | PRN
  Administered 2017-12-22: 12.5 mg via INTRAVENOUS

## 2017-12-22 MED ORDER — DOCUSATE SODIUM 100 MG PO CAPS
100.0000 mg | ORAL_CAPSULE | Freq: Two times a day (BID) | ORAL | Status: DC
  Filled 2017-12-22 (×2): qty 1

## 2017-12-22 SURGICAL SUPPLY — 45 items
ADH SKN CLS APL DERMABOND .7 (GAUZE/BANDAGES/DRESSINGS) ×1
BAG DECANTER FOR FLEXI CONT (MISCELLANEOUS) IMPLANT
BAG SPEC THK2 15X12 ZIP CLS (MISCELLANEOUS)
BAG ZIPLOCK 12X15 (MISCELLANEOUS) IMPLANT
BLADE SAG 18X100X1.27 (BLADE) ×2 IMPLANT
COVER PERINEAL POST (MISCELLANEOUS) ×2 IMPLANT
COVER SURGICAL LIGHT HANDLE (MISCELLANEOUS) ×2 IMPLANT
COVER WAND RF STERILE (DRAPES) ×1 IMPLANT
CUP ACETBLR 54 OD PINNACLE (Hips) ×1 IMPLANT
DERMABOND ADVANCED (GAUZE/BANDAGES/DRESSINGS) ×1
DERMABOND ADVANCED .7 DNX12 (GAUZE/BANDAGES/DRESSINGS) ×1 IMPLANT
DRAPE STERI IOBAN 125X83 (DRAPES) ×2 IMPLANT
DRAPE U-SHAPE 47X51 STRL (DRAPES) ×4 IMPLANT
DRESSING AQUACEL AG SP 3.5X10 (GAUZE/BANDAGES/DRESSINGS) ×1 IMPLANT
DRSG AQUACEL AG ADV 3.5X10 (GAUZE/BANDAGES/DRESSINGS) ×1 IMPLANT
DRSG AQUACEL AG SP 3.5X10 (GAUZE/BANDAGES/DRESSINGS) ×2
DURAPREP 26ML APPLICATOR (WOUND CARE) ×2 IMPLANT
ELECT REM PT RETURN 15FT ADLT (MISCELLANEOUS) ×2 IMPLANT
ELIMINATOR HOLE APEX DEPUY (Hips) ×1 IMPLANT
FEM STEM 12/14 TAPER SZ 4 HIP (Orthopedic Implant) ×2 IMPLANT
FEMORAL STEM 12/14 TPR SZ4 HIP (Orthopedic Implant) IMPLANT
GLOVE BIOGEL M STRL SZ7.5 (GLOVE) IMPLANT
GLOVE BIOGEL PI IND STRL 7.5 (GLOVE) ×1 IMPLANT
GLOVE BIOGEL PI IND STRL 8.5 (GLOVE) ×1 IMPLANT
GLOVE BIOGEL PI INDICATOR 7.5 (GLOVE) ×1
GLOVE BIOGEL PI INDICATOR 8.5 (GLOVE) ×1
GLOVE ECLIPSE 8.0 STRL XLNG CF (GLOVE) ×4 IMPLANT
GLOVE ORTHO TXT STRL SZ7.5 (GLOVE) ×4 IMPLANT
GOWN STRL REUS W/TWL 2XL LVL3 (GOWN DISPOSABLE) ×1 IMPLANT
GOWN STRL REUS W/TWL LRG LVL3 (GOWN DISPOSABLE) ×2 IMPLANT
HEAD CERAMIC DELTA 36 PLUS 1.5 (Hips) ×1 IMPLANT
HOLDER FOLEY CATH W/STRAP (MISCELLANEOUS) ×2 IMPLANT
LINER NEUTRAL 54X36MM PLUS 4 (Hips) ×1 IMPLANT
PACK ANTERIOR HIP CUSTOM (KITS) ×2 IMPLANT
SCREW 6.5MMX25MM (Screw) ×1 IMPLANT
SUT MNCRL AB 4-0 PS2 18 (SUTURE) ×2 IMPLANT
SUT STRATAFIX 0 PDS 27 VIOLET (SUTURE) ×2
SUT VIC AB 1 CT1 36 (SUTURE) ×6 IMPLANT
SUT VIC AB 2-0 CT1 27 (SUTURE) ×4
SUT VIC AB 2-0 CT1 TAPERPNT 27 (SUTURE) ×2 IMPLANT
SUTURE STRATFX 0 PDS 27 VIOLET (SUTURE) ×1 IMPLANT
TRAY FOLEY MTR SLVR 14FR STAT (SET/KITS/TRAYS/PACK) ×1 IMPLANT
TRAY FOLEY MTR SLVR 16FR STAT (SET/KITS/TRAYS/PACK) IMPLANT
WATER STERILE IRR 1000ML POUR (IV SOLUTION) ×2 IMPLANT
YANKAUER SUCT BULB TIP 10FT TU (MISCELLANEOUS) ×1 IMPLANT

## 2017-12-22 NOTE — Transfer of Care (Signed)
Immediate Anesthesia Transfer of Care Note  Patient: Kathleen Ramirez  Procedure(s) Performed: RIGHT TOTAL HIP ARTHROPLASTY ANTERIOR APPROACH (Right Hip)  Patient Location: PACU  Anesthesia Type:Spinal  Level of Consciousness: awake, alert  and oriented  Airway & Oxygen Therapy: Patient Spontanous Breathing and Patient connected to face mask oxygen  Post-op Assessment: Report given to RN and Post -op Vital signs reviewed and stable  Post vital signs: Reviewed and stable  Last Vitals:  Vitals Value Taken Time  BP 94/52 12/22/2017 10:33 AM  Temp    Pulse 80 12/22/2017 10:36 AM  Resp 17 12/22/2017 10:36 AM  SpO2 100 % 12/22/2017 10:36 AM  Vitals shown include unvalidated device data.  Last Pain:  Vitals:   12/22/17 0644  TempSrc:   PainSc: 10-Worst pain ever      Patients Stated Pain Goal: 4 (72/15/87 2761)  Complications: No apparent anesthesia complications

## 2017-12-22 NOTE — Anesthesia Procedure Notes (Signed)
Spinal  Patient location during procedure: OR End time: 12/22/2017 8:32 AM Staffing Anesthesiologist: Audry Pili, MD Resident/CRNA: Maxwell Caul, CRNA Performed: resident/CRNA  Preanesthetic Checklist Completed: patient identified, site marked, surgical consent, pre-op evaluation, timeout performed, IV checked, risks and benefits discussed and monitors and equipment checked Spinal Block Patient position: sitting Prep: DuraPrep Patient monitoring: heart rate, continuous pulse ox and blood pressure Location: L3-4 Injection technique: single-shot Needle Needle type: Pencan  Needle gauge: 24 G Needle length: 10 cm Additional Notes Expiration date checked. Patient sitting for SAB placement. Time out performed. Single shot, negative heme/negative paresthesia. Patient tolerated well. Dr Fransisco Beau at bedside during entire placement.

## 2017-12-22 NOTE — Anesthesia Preprocedure Evaluation (Addendum)
Anesthesia Evaluation  Patient identified by MRN, date of birth, ID band Patient awake    Reviewed: Allergy & Precautions, NPO status , Patient's Chart, lab work & pertinent test results  History of Anesthesia Complications Negative for: history of anesthetic complications  Airway Mallampati: II  TM Distance: >3 FB Neck ROM: Full    Dental  (+) Partial Lower, Upper Dentures, Dental Advisory Given   Pulmonary asthma , COPD, former smoker (quit x 1 mo),    breath sounds clear to auscultation       Cardiovascular negative cardio ROS   Rhythm:Regular Rate:Normal     Neuro/Psych PSYCHIATRIC DISORDERS Depression negative neurological ROS     GI/Hepatic negative GI ROS, Neg liver ROS,   Endo/Other  diabetes, Type 2, Oral Hypoglycemic Agents  Renal/GU negative Renal ROS     Musculoskeletal  (+) Arthritis ,   Abdominal   Peds  Hematology negative hematology ROS (+)   Anesthesia Other Findings   Reproductive/Obstetrics  Cervical cancer s/p hysterectomy and oophorectomy                             Anesthesia Physical Anesthesia Plan  ASA: II  Anesthesia Plan: Spinal   Post-op Pain Management:    Induction:   PONV Risk Score and Plan: 2 and Treatment may vary due to age or medical condition and Propofol infusion  Airway Management Planned: Natural Airway and Simple Face Mask  Additional Equipment: None  Intra-op Plan:   Post-operative Plan:   Informed Consent: I have reviewed the patients History and Physical, chart, labs and discussed the procedure including the risks, benefits and alternatives for the proposed anesthesia with the patient or authorized representative who has indicated his/her understanding and acceptance.     Plan Discussed with: CRNA and Anesthesiologist  Anesthesia Plan Comments: (Labs reviewed, platelets acceptable. Discussed risks and benefits of spinal,  including spinal/epidural hematoma, infection, failed block, and PDPH. Patient expressed understanding and wished to proceed. )       Anesthesia Quick Evaluation

## 2017-12-22 NOTE — Interval H&P Note (Signed)
History and Physical Interval Note:  12/22/2017 7:06 AM  Kathleen Ramirez  has presented today for surgery, with the diagnosis of Right hip osteoarthritis  The various methods of treatment have been discussed with the patient and family. After consideration of risks, benefits and other options for treatment, the patient has consented to  Procedure(s) with comments: [place] (Right) - 70 as a surgical intervention .  The patient's history has been reviewed, patient examined, no change in status, stable for surgery.  I have reviewed the patient's chart and labs.  Questions were answered to the patient's satisfaction.     [name]

## 2017-12-22 NOTE — Anesthesia Postprocedure Evaluation (Signed)
Anesthesia Post Note  Patient: Kathleen Ramirez  Procedure(s) Performed: RIGHT TOTAL HIP ARTHROPLASTY ANTERIOR APPROACH (Right Hip)     Patient location during evaluation: PACU Anesthesia Type: Spinal Level of consciousness: awake and alert Pain management: pain level controlled Vital Signs Assessment: post-procedure vital signs reviewed and stable Respiratory status: spontaneous breathing and respiratory function stable Cardiovascular status: blood pressure returned to baseline and stable Postop Assessment: spinal receding and no apparent nausea or vomiting Anesthetic complications: no    Last Vitals:  Vitals:   12/22/17 1140 12/22/17 1150  BP: 105/70 (!) 97/45  Pulse: (!) 59 72  Resp: 16 14  Temp:  36.5 C  SpO2: 99% 98%    Last Pain:  Vitals:   12/22/17 1150  TempSrc: Oral  PainSc: 0-No pain                 Audry Pili

## 2017-12-22 NOTE — Anesthesia Procedure Notes (Signed)
Procedure Name: MAC Date/Time: 12/22/2017 8:25 AM Performed by: Maxwell Caul, CRNA Pre-anesthesia Checklist: Patient identified, Emergency Drugs available, Suction available and Patient being monitored Oxygen Delivery Method: Simple face mask

## 2017-12-22 NOTE — Evaluation (Signed)
Physical Therapy Evaluation Patient Details Name: Kathleen Ramirez MRN: [id number] DOB: 11/26/1945 Today's Date: 12/22/2017   History of Present Illness  72 yo female s/p R DA-THA on 12/22/17. PMH includes aortic stenosis, DM, HLD, tobacco use, depression, anxiety, COPD, cervical cancer.   Clinical Impression   Pt presents with severe R hip pain, difficulty performing mobility tasks, increased time for gait, and decreased tolerance for ambulation. Pt to benefit from acute PT to address deficits. Pt ambulated 40 ft with very increased time, with min guard assist and RW use. Pt educated on quad sets (5-10/hour), ankle pumps (20/hour), and heel slides (5-10/hour) to perform this afternoon/evening to lessen stiffness and increase circulation, to pt's tolerance and limited by pain. PT to progress mobility as tolerated, and will continue to follow acutely.       Follow Up Recommendations Follow surgeon's recommendation for DC plan and follow-up therapies;Supervision for mobility/OOB(HEP )    Equipment Recommendations  None recommended by PT    Recommendations for Other Services       Precautions / Restrictions Precautions Precautions: Fall Restrictions Weight Bearing Restrictions: No Other Position/Activity Restrictions: WBAT       Mobility  Bed Mobility Overal bed mobility: Needs Assistance Bed Mobility: Supine to Sit     Supine to sit: Min assist;HOB elevated     General bed mobility comments: min assist for trunk elevation, RLE management, and scooting to EOB. Assist for steadying as well.   Transfers Overall transfer level: Needs assistance Equipment used: Rolling walker (2 wheeled) Transfers: Sit to/from Stand Sit to Stand: Min assist;From elevated surface;+2 safety/equipment         General transfer comment: Assist for power up and steadying upon standing. Verbal cuing for hand placement.   Ambulation/Gait Ambulation/Gait assistance: Min guard;+2 safety/equipment(chair  follow ) Gait Distance (Feet): 40 Feet Assistive device: Rolling walker (2 wheeled) Gait Pattern/deviations: Step-to pattern;Decreased stride length;Decreased weight shift to right;Decreased stance time - right;Antalgic Gait velocity: very decr    General Gait Details: Min guard for safety. verbal cuing for sequencing, placement in RW x2.   Stairs            Wheelchair Mobility    Modified Rankin (Stroke Patients Only)       Balance Overall balance assessment: Needs assistance Sitting-balance support: Feet supported Sitting balance-Leahy Scale: Fair     Standing balance support: Bilateral upper extremity supported Standing balance-Leahy Scale: Poor Standing balance comment: relies on RW                              Pertinent Vitals/Pain Pain Assessment: 0-10 Pain Score: 10-Worst pain ever Pain Location: R hip  Pain Descriptors / Indicators: Throbbing Pain Intervention(s): Limited activity within patient's tolerance;Repositioned;Ice applied;Monitored during session    [place] expects to be discharged to:: Private residence Living Arrangements: Alone Available Help at Discharge: Family;Available PRN/intermittently(son lives closeby and can help as needed) Type of Home: House Home Access: Stairs to enter Entrance Stairs-Rails: Left Entrance Stairs-Number of Steps: 5 Home Layout: One level Home Equipment: Bedside commode;Walker - 2 wheels      Prior Function Level of Independence: Independent               Hand Dominance   Dominant Hand: Right    Extremity/Trunk Assessment   Upper Extremity Assessment Upper Extremity Assessment: Overall WFL for tasks assessed    Lower Extremity Assessment Lower Extremity Assessment: Generalized weakness;RLE deficits/detail RLE  Deficits / Details: suspected post-surgical hip weakness; able to perform quad set, needs assist for SLR  RLE Sensation: WNL    Cervical / Trunk  Assessment Cervical / Trunk Assessment: Kyphotic  Communication   Communication: No difficulties  Cognition Arousal/Alertness: Awake/alert Behavior During Therapy: WFL for tasks assessed/performed Overall Cognitive Status: Within Functional Limits for tasks assessed                                        General Comments      Exercises Total Joint Exercises Ankle Circles/Pumps: AROM;Both;5 reps;Seated   Assessment/Plan    PT Assessment Patient needs continued PT services  PT Problem List Decreased strength;Pain;Decreased range of motion;Decreased activity tolerance;Decreased knowledge of use of DME;Decreased balance;Decreased safety awareness;Decreased mobility       PT Treatment Interventions DME instruction;Therapeutic activities;Gait training;Therapeutic exercise;Patient/family education;Stair training;Balance training;Functional mobility training    PT Goals (Current goals can be found in the Care Plan section)  Acute Rehab PT Goals Patient Stated Goal: walk better  PT Goal Formulation: With patient Time For Goal Achievement: 12/29/17 Potential to Achieve Goals: Good    Frequency 7X/week   Barriers to discharge        Co-evaluation               AM-PAC PT "6 Clicks" Daily Activity  Outcome Measure Difficulty turning over in bed (including adjusting bedclothes, sheets and blankets)?: Unable Difficulty moving from lying on back to sitting on the side of the bed? : Unable Difficulty sitting down on and standing up from a chair with arms (e.g., wheelchair, bedside commode, etc,.)?: Unable Help needed moving to and from a bed to chair (including a wheelchair)?: A Little Help needed walking in hospital room?: A Little Help needed climbing 3-5 steps with a railing? : A Little 6 Click Score: 12    End of Session Equipment Utilized During Treatment: Gait belt Activity Tolerance: Patient tolerated treatment well;Patient limited by pain Patient  left: in chair;with chair alarm set;with call bell/phone within reach;with SCD's reapplied Nurse Communication: Mobility status PT Visit Diagnosis: Other abnormalities of gait and mobility (R26.89);Difficulty in walking, not elsewhere classified (R26.2)    Time: 2863-8177 PT Time Calculation (min) (ACUTE ONLY): 23 min   Charges:   PT Evaluation $PT Eval Low Complexity: 1 Low PT Treatments $Gait Training: 8-22 mins        Julien Girt, PT Acute Rehabilitation Services Pager [phone]  Office [phone]  Mylinh Cragg D Elonda Husky 12/22/2017, 5:32 PM

## 2017-12-22 NOTE — Op Note (Addendum)
NAME:  Kathleen Ramirez                ACCOUNT NO.: [id number]      MEDICAL RECORD NO.: [id number]      FACILITY:  [facility]      PHYSICIAN:  [name]  DATE OF BIRTH:  [birth year]     DATE OF PROCEDURE:  12/22/2017                                 OPERATIVE REPORT         PREOPERATIVE DIAGNOSIS: Right  hip osteoarthritis.      POSTOPERATIVE DIAGNOSIS:  Right hip osteoarthritis with significant periacetabular osteophytic     PROCEDURE:  Right total hip replacement through an anterior approach   utilizing DePuy THR system, component size 41mm pinnacle cup, a size 36+4 neutral   Altrex liner, a size 4 Actis stem with a 36+1.5 delta ceramic   ball.      SURGEON:  Pietro Cassis. [name], M.D.      ASSISTANT:  Nehemiah Massed, PA-C     ANESTHESIA:  Spinal.      SPECIMENS:  None.      COMPLICATIONS:  None.      BLOOD LOSS:  300 cc     DRAINS:  None.      INDICATION OF THE PROCEDURE:  Kathleen Ramirez is a 72 y.o. female who had   presented to office for evaluation of right hip pain.  Radiographs revealed   progressive degenerative changes with bone-on-bone   articulation of the  hip joint, including subchondral cystic changes and osteophytes.  The patient had painful limited range of   motion significantly affecting their overall quality of life and function.  The patient was failing to    respond to conservative measures including medications and/or injections and activity modification and at this point was ready   to proceed with more definitive measures.  Consent was obtained for   benefit of pain relief.  Specific risks of infection, DVT, component   failure, dislocation, neurovascular injury, and need for revision surgery were reviewed in the office as well discussion of   the anterior versus posterior approach were reviewed.     PROCEDURE IN DETAIL:  The patient was brought to operative theater.   Once adequate anesthesia, preoperative antibiotics, 2 gm of Ancef,  1 gm of Tranexamic Acid, and 10 mg of Decadron were administered, the patient was positioned supine on the [employer] table.  Once the patient was safely positioned with adequate padding of boney prominences we predraped out the hip, and used fluoroscopy to confirm orientation of the pelvis.      The right hip was then prepped and draped from proximal iliac crest to   mid thigh with a shower curtain technique.      Time-out was performed identifying the patient, planned procedure, and the appropriate extremity.     An incision was then made 2 cm lateral to the   anterior superior iliac spine extending over the orientation of the   tensor fascia lata muscle and sharp dissection was carried down to the   fascia of the muscle.      The fascia was then incised.  The muscle belly was identified and swept   laterally and retractor placed along the superior neck.  Following   cauterization of the circumflex vessels and removing some pericapsular  fat, a second cobra retractor was placed on the inferior neck.  A T-capsulotomy was made along the line of the   superior neck to the trochanteric fossa, then extended proximally and   distally.  Tag sutures were placed and the retractors were then placed   intracapsular.  We then identified the trochanteric fossa and   orientation of my neck cut and then made a neck osteotomy with the femur on traction.  The femoral   head was removed without difficulty or complication.  Traction was let   off and retractors were placed posterior and anterior around the   acetabulum.      The labrum and foveal tissue were debrided.  I began reaming with a 46 mm   reamer and reamed up to 53 mm reamer with good bony bed preparation and a 54 mm  cup was chosen.  The final 54 mm Pinnacle cup was then impacted under fluoroscopy to confirm the depth of penetration and orientation with respect to   Abduction and forward flexion.  A screw was placed into the ilium followed by  the hole eliminator.  The final   36+4 neutral Altrex liner was impacted with good visualized rim fit.  The cup was positioned anatomically within the acetabular portion of the pelvis.      At this point, the femur was rolled to 100 degrees.  Further capsule was   released off the inferior aspect of the femoral neck.  I then   released the superior capsule proximally.  With the leg in a neutral position the hook was placed laterally   along the femur under the vastus lateralis origin and elevated manually and then held in position using the hook attachment on the bed.  The leg was then extended and adducted with the leg rolled to 100   degrees of external rotation.  Retractors were placed along the medial calcar and posteriorly over the greater trochanter.  Once the proximal femur was fully   exposed, I used a box osteotome to set orientation.  I then began   broaching with the starting chili pepper broach and passed this by hand and then broached up to 4.  With the 4 broach in place I chose a high offset neck and did several trial reductions.  The offset was appropriate, leg lengths   appeared to be equal best matched with the +1.5 head ball trial confirmed radiographically.   Given these findings, I went ahead and dislocated the hip, repositioned all   retractors and positioned the right hip in the extended and abducted position.  The final 4 Hi Actis stem was   chosen and it was impacted down to the level of neck cut.  Based on this   and the trial reductions, a final 36+1.5 delta ceramic ball was chosen and   impacted onto a clean and dry trunnion, and the hip was reduced.  The   hip had been irrigated throughout the case again at this point.  I did   reapproximate the superior capsular leaflet to the anterior leaflet   using #1 Vicryl.  The fascia of the   tensor fascia lata muscle was then reapproximated using #1 Vicryl and #0 Stratafix sutures.  The   remaining wound was closed with 2-0  Vicryl and running 4-0 Monocryl.   The hip was cleaned, dried, and dressed sterilely using Dermabond and   Aquacel dressing.  The patient was then brought   to recovery room in  stable condition tolerating the procedure well.    Nehemiah Massed, PA-C was present for the entirety of the case involved from   preoperative positioning, perioperative retractor management, general   facilitation of the case, as well as primary wound closure as assistant.            Pietro Cassis [name], M.D.        12/22/2017 9:57 AM

## 2017-12-22 NOTE — Discharge Instructions (Signed)

## 2017-12-23 DIAGNOSIS — E663 Overweight: Secondary | ICD-10-CM | POA: Diagnosis present

## 2017-12-23 LAB — BASIC METABOLIC PANEL
ANION GAP: 8 (ref 5–15)
BUN: 18 mg/dL (ref 8–23)
CALCIUM: 8.3 mg/dL — AB (ref 8.9–10.3)
CO2: 25 mmol/L (ref 22–32)
Chloride: 105 mmol/L (ref 98–111)
Creatinine, Ser: 1.14 mg/dL — ABNORMAL HIGH (ref 0.44–1.00)
GFR calc Af Amer: 55 mL/min — ABNORMAL LOW (ref >60)
GFR calc non Af Amer: 47 mL/min — ABNORMAL LOW (ref >60)
GLUCOSE: 178 mg/dL — AB (ref 70–99)
Potassium: 4.6 mmol/L (ref 3.5–5.1)
Sodium: 138 mmol/L (ref 135–145)

## 2017-12-23 LAB — GLUCOSE, CAPILLARY
Glucose-Capillary: 163 mg/dL — ABNORMAL HIGH (ref 70–99)
Glucose-Capillary: 166 mg/dL — ABNORMAL HIGH (ref 70–99)
Glucose-Capillary: 193 mg/dL — ABNORMAL HIGH (ref 70–99)

## 2017-12-23 LAB — CBC
HEMATOCRIT: 38.7 % (ref 36.0–46.0)
Hemoglobin: 12.2 g/dL (ref 12.0–15.0)
MCH: 29.2 pg (ref 26.0–34.0)
MCHC: 31.5 g/dL (ref 30.0–36.0)
MCV: 92.6 fL (ref 80.0–100.0)
Platelets: 182 10*3/uL (ref 150–400)
RBC: 4.18 MIL/uL (ref 3.87–5.11)
RDW: 12.4 % (ref 11.5–15.5)
WBC: 16.5 10*3/uL — ABNORMAL HIGH (ref 4.0–10.5)
nRBC: 0 % (ref 0.0–0.2)

## 2017-12-23 NOTE — Progress Notes (Signed)
Physical Therapy Treatment Patient Details Name: Kathleen Ramirez MRN: [id number] DOB: [birth year] Today's Date: 12/23/2017    History of Present Illness 72 yo female s/p R DA-THA on 12/22/17. PMH includes aortic stenosis, DM, HLD, tobacco use, depression, anxiety, COPD, cervical cancer.     PT Comments    POD # 1 pm session Assisted with amb a greater distance, practiced stair and assisted with ADL's. Pt ready for D/C to home.   Follow Up Recommendations  Follow surgeon's recommendation for DC plan and follow-up therapies;Supervision for mobility/OOB     Equipment Recommendations  None recommended by PT(HEP)    Recommendations for Other Services       Precautions / Restrictions Precautions Precautions: Fall Restrictions Weight Bearing Restrictions: No Other Position/Activity Restrictions: WBAT     Mobility  Bed Mobility               General bed mobility comments: OOB in recliner   Transfers Overall transfer level: Needs assistance Equipment used: Rolling walker (2 wheeled) Transfers: Sit to/from Stand Sit to Stand: Min guard         General transfer comment: 25% VC's on proper hand placement and safety   Ambulation/Gait Ambulation/Gait assistance: Supervision;Min guard Gait Distance (Feet): 110 Feet Assistive device: Rolling walker (2 wheeled) Gait Pattern/deviations: Step-to pattern;Decreased stride length;Decreased weight shift to right;Decreased stance time - right;Antalgic Gait velocity: decreased    General Gait Details: Min guard for safety. verbal cuing for sequencing and safety    Stairs Stairs: Yes Stairs assistance: Min guard;Min assist Stair Management: One rail Left;Sideways;Forwards Number of Stairs: 5 General stair comments: 25% VC's on safety and proper sequencing   Wheelchair Mobility    Modified Rankin (Stroke Patients Only)       Balance                                            Cognition  Arousal/Alertness: Awake/alert Behavior During Therapy: WFL for tasks assessed/performed Overall Cognitive Status: Within Functional Limits for tasks assessed                                        Exercises      General Comments        Pertinent Vitals/Pain Pain Assessment: 0-10 Pain Score: 3  Pain Location: R hip  Pain Descriptors / Indicators: Operative site guarding;Tender Pain Intervention(s): Monitored during session;Repositioned;Ice applied    Home Living                      Prior Function            PT Goals (current goals can now be found in the care plan section) Progress towards PT goals: Progressing toward goals    Frequency    7X/week      PT Plan Current plan remains appropriate    Co-evaluation              AM-PAC PT "6 Clicks" Daily Activity  Outcome Measure  Difficulty turning over in bed (including adjusting bedclothes, sheets and blankets)?: Unable Difficulty moving from lying on back to sitting on the side of the bed? : Unable Difficulty sitting down on and standing up from a chair with arms (e.g., wheelchair, bedside commode, etc,.)?: Unable Help needed moving  to and from a bed to chair (including a wheelchair)?: A Little Help needed walking in hospital room?: A Little Help needed climbing 3-5 steps with a railing? : A Little 6 Click Score: 12    End of Session Equipment Utilized During Treatment: Gait belt Activity Tolerance: Patient tolerated treatment well;Patient limited by pain Patient left: in chair;with chair alarm set;with call bell/phone within reach;with SCD's reapplied Nurse Communication: Mobility status PT Visit Diagnosis: Other abnormalities of gait and mobility (R26.89);Difficulty in walking, not elsewhere classified (R26.2)     Time: 9030-0923 PT Time Calculation (min) (ACUTE ONLY): 27 min  Charges:  $Gait Training: 8-22 mins $Therapeutic Activity: 8-22 mins                      Rica Koyanagi  PTA Acute  Rehabilitation Services Pager      [phone] Office      [phone]

## 2017-12-23 NOTE — Progress Notes (Signed)
     Subjective: 1 Day Post-Op Procedure(s) (LRB): RIGHT TOTAL HIP ARTHROPLASTY ANTERIOR APPROACH (Right)   Patient reports pain as mild, pain controlled. No events throughout the night.  Looking forward to improving.  Ready to be discharged home.   Objective:   VITALS:   Vitals:   12/23/17 0138 12/23/17 0506  BP: (!) 108/51 114/68  Pulse: 61 60  Resp: 16 14  Temp: 97.8 F (36.6 C) 97.7 F (36.5 C)  SpO2: 98% 99%    Dorsiflexion/Plantar flexion intact Incision: dressing C/D/I No cellulitis present Compartment soft  LABS Recent Labs    12/23/17 0531  HGB 12.2  HCT 38.7  WBC 16.5*  PLT 182    Recent Labs    12/23/17 0531  NA 138  K 4.6  BUN 18  CREATININE 1.14*  GLUCOSE 178*     Assessment/Plan: 1 Day Post-Op Procedure(s) (LRB): RIGHT TOTAL HIP ARTHROPLASTY ANTERIOR APPROACH (Right) Foley cath d/c'ed Advance diet Up with therapy D/C IV fluids Discharge home Follow up in 2 weeks at [facility] Follow up with OLIN,Dulcinea Kinser D in 2 weeks.  Contact information:  [facility]) [address], [place] [phone]    Overweight (BMI 25-29.9) Estimated body mass index is 28.35 kg/m as calculated from the following:   Height as of this encounter: 5\' 5"  (1.651 m).   Weight as of this encounter: 77.3 kg. Patient also counseled that weight may inhibit the healing process Patient counseled that losing weight will help with future health issues       West Pugh. Trong Gosling   PAC  12/23/2017, 8:18 AM

## 2017-12-23 NOTE — Progress Notes (Signed)
Physical Therapy Treatment Patient Details Name: Kathleen Ramirez MRN: [id number] DOB: 12-05-1945 Today's Date: 12/23/2017    History of Present Illness 72 yo female s/p R DA-THA on 12/22/17. PMH includes aortic stenosis, DM, HLD, tobacco use, depression, anxiety, COPD, cervical cancer.     PT Comments    POD # 1 am session Assisted with amb a greater distance, assisted to bathroom and performed some THR TE's following HEP handout.     Follow Up Recommendations  Follow surgeon's recommendation for DC plan and follow-up therapies;Supervision for mobility/OOB     Equipment Recommendations  None recommended by PT(HEP)    Recommendations for Other Services       Precautions / Restrictions Precautions Precautions: Fall Restrictions Weight Bearing Restrictions: No Other Position/Activity Restrictions: WBAT     Mobility  Bed Mobility               General bed mobility comments: OOB in recliner   Transfers Overall transfer level: Needs assistance Equipment used: Rolling walker (2 wheeled) Transfers: Sit to/from Stand Sit to Stand: Min guard         General transfer comment: 25% VC's on proper hand placement and safety   Ambulation/Gait Ambulation/Gait assistance: Supervision;Min guard Gait Distance (Feet): 55 Feet Assistive device: Rolling walker (2 wheeled) Gait Pattern/deviations: Step-to pattern;Decreased stride length;Decreased weight shift to right;Decreased stance time - right;Antalgic Gait velocity: decreased    General Gait Details: Min guard for safety. verbal cuing for sequencing and safety    Stairs             Wheelchair Mobility    Modified Rankin (Stroke Patients Only)       Balance                                            Cognition Arousal/Alertness: Awake/alert Behavior During Therapy: WFL for tasks assessed/performed Overall Cognitive Status: Within Functional Limits for tasks assessed                                         Exercises   Total Hip Replacement TE's 10 reps ankle pumps 10 reps knee presses 10 reps heel slides 10 reps SAQ's 10 reps ABD Followed by ICE     General Comments        Pertinent Vitals/Pain Pain Assessment: 0-10 Pain Score: 3  Pain Location: R hip  Pain Descriptors / Indicators: Operative site guarding;Tender Pain Intervention(s): Monitored during session;Repositioned;Ice applied    Home Living                      Prior Function            PT Goals (current goals can now be found in the care plan section) Progress towards PT goals: Progressing toward goals    Frequency    7X/week      PT Plan Current plan remains appropriate    Co-evaluation              AM-PAC PT "6 Clicks" Daily Activity  Outcome Measure  Difficulty turning over in bed (including adjusting bedclothes, sheets and blankets)?: Unable Difficulty moving from lying on back to sitting on the side of the bed? : Unable Difficulty sitting down on and standing up from a chair  with arms (e.g., wheelchair, bedside commode, etc,.)?: Unable Help needed moving to and from a bed to chair (including a wheelchair)?: A Little Help needed walking in hospital room?: A Little Help needed climbing 3-5 steps with a railing? : A Little 6 Click Score: 12    End of Session Equipment Utilized During Treatment: Gait belt Activity Tolerance: Patient tolerated treatment well;Patient limited by pain Patient left: in chair;with chair alarm set;with call bell/phone within reach;with SCD's reapplied Nurse Communication: Mobility status PT Visit Diagnosis: Other abnormalities of gait and mobility (R26.89);Difficulty in walking, not elsewhere classified (R26.2)     Time: 8250-5397 PT Time Calculation (min) (ACUTE ONLY): 25 min  Charges:  $Gait Training: 8-22 mins $Therapeutic Exercise: 8-22 mins                     Rica Koyanagi  PTA Acute  Rehabilitation  Services Pager      [phone] Office      [phone]

## 2017-12-24 ENCOUNTER — Encounter (HOSPITAL_COMMUNITY): Payer: Self-pay | Admitting: Orthopedic Surgery

## 2017-12-28 NOTE — Discharge Summary (Signed)
Physician Discharge Summary  Patient ID: Kathleen Ramirez MRN: [id number] DOB/AGE: [birth year] 72 y.o.  Admit date: 12/22/2017 Discharge date: 12/23/2017   Procedures:  Procedure(s) (LRB): RIGHT TOTAL HIP ARTHROPLASTY ANTERIOR APPROACH (Right)  Attending Physician:  Dr. Paralee Cancel   Admission Diagnoses:   Right hip primary OA / pain  Discharge Diagnoses:  Principal Problem:   S/P right THA, AA Active Problems:   S/P hip replacement   Overweight (BMI 25.0-29.9)  Past Medical History:  Diagnosis Date  . Bilateral ovarian tumors   . Cancer ([place])   . Cervical cancer ([place])   . Diabetes mellitus without complication ([place])   . Dysplastic colon polyp   . HLD (hyperlipidemia)   . Smoking   . Tumors    5tumors removed  . UTI (lower urinary tract infection)   . Yeast vaginitis     HPI:    Kathleen Ramirez, 72 y.o. female, has a history of pain and functional disability in the right hip(s) due to arthritis and patient has failed non-surgical conservative treatments for greater than 12 weeks to include NSAID's and/or analgesics, use of assistive devices and activity modification.  Onset of symptoms was gradual starting 2 years ago with gradually worsening course since that time.The patient noted no past surgery on the right hip(s).  Patient currently rates pain in the right hip at 10 out of 10 with activity. Patient has night pain, worsening of pain with activity and weight bearing, trendelenberg gait, pain that interfers with activities of daily living and pain with passive range of motion. Patient has evidence of periarticular osteophytes and joint space narrowing by imaging studies. This condition presents safety issues increasing the risk of falls. There is no current active infection.  Risks, benefits and expectations were discussed with the patient.  Risks including but not limited to the risk of anesthesia, blood clots, nerve damage, blood vessel damage, failure of the prosthesis, infection  and up to and including death.  Patient understand the risks, benefits and expectations and wishes to proceed with surgery.  PCP: Susy Frizzle, MD   Discharged Condition: good  Hospital Course:  Patient underwent the above stated procedure on 12/22/2017. Patient tolerated the procedure well and brought to the recovery room in good condition and subsequently to the floor.  POD #1 BP: 114/68 ; Pulse: 60 ; Temp: 97.7 F (36.5 C) ; Resp: 14 Patient reports pain as mild, pain controlled. No events throughout the night.  Looking forward to improving.  Ready to be discharged home. Dorsiflexion/plantar flexion intact, incision: dressing C/D/I, no cellulitis present and compartment soft.   LABS  Basename    HGB     12.2  HCT     38.7    Discharge Exam: General appearance: alert, cooperative and no distress Extremities: Homans sign is negative, no sign of DVT, no edema, redness or tenderness in the calves or thighs and no ulcers, gangrene or trophic changes  Disposition:  Home with follow up in 2 weeks   Follow-up Information    Paralee Cancel, MD. Schedule an appointment as soon as possible for a visit in 2 weeks.   Specialty:  Orthopedic Surgery Contact information: [address] [place] [address] [phone]           Discharge Instructions    Call MD / Call 911   Complete by:  As directed    If you experience chest pain or shortness of breath, CALL 911 and be transported to the  hospital emergency room.  If you develope a fever above 101 F, pus (white drainage) or increased drainage or redness at the wound, or calf pain, call your surgeon's office.   Change dressing   Complete by:  As directed    Maintain surgical dressing until follow up in the clinic. If the edges start to pull up, may reinforce with tape. If the dressing is no longer working, may remove and cover with gauze and tape, but must keep the area dry and clean.  Call with any questions or  concerns.   Constipation Prevention   Complete by:  As directed    Drink plenty of fluids.  Prune juice may be helpful.  You may use a stool softener, such as Colace (over the counter) 100 mg twice a day.  Use MiraLax (over the counter) for constipation as needed.   Diet - low sodium heart healthy   Complete by:  As directed    Discharge instructions   Complete by:  As directed    Maintain surgical dressing until follow up in the clinic. If the edges start to pull up, may reinforce with tape. If the dressing is no longer working, may remove and cover with gauze and tape, but must keep the area dry and clean.  Follow up in 2 weeks at [facility]. Call with any questions or concerns.   Increase activity slowly as tolerated   Complete by:  As directed    Weight bearing as tolerated with assist device (walker, cane, etc) as directed, use it as long as suggested by your surgeon or therapist, typically at least 4-6 weeks.   TED hose   Complete by:  As directed    Use stockings (TED hose) for 2 weeks on both leg(s).  You may remove them at night for sleeping.      Allergies as of 12/23/2017      Reactions   Codeine Nausea And Vomiting    makes heart race      Medication List    STOP taking these medications   diclofenac 75 MG EC tablet Commonly known as:  VOLTAREN   meloxicam 7.5 MG tablet Commonly known as:  MOBIC     TAKE these medications   albuterol 108 (90 Base) MCG/ACT inhaler Commonly known as:  PROVENTIL HFA;VENTOLIN HFA INHALE 2 PUFFS BY MOUTH EVERY 6 HOURS AS NEEDED FOR WHEEZING& SHORTNESS OF BREATH What changed:  See the new instructions.   aspirin 81 MG chewable tablet Chew 1 tablet (81 mg total) by mouth 2 (two) times daily. Take for 4 weeks, then resume regular dose.   atorvastatin 20 MG tablet Commonly known as:  LIPITOR TAKE 1 TABLET(20 MG) BY MOUTH DAILY What changed:  See the new instructions.   CINNAMON PO Take 1 capsule by mouth daily.     docusate sodium 100 MG capsule Commonly known as:  COLACE Take 1 capsule (100 mg total) by mouth 2 (two) times daily.   ferrous sulfate 325 (65 FE) MG tablet Take 1 tablet (325 mg total) by mouth 3 (three) times daily with meals.   HYDROcodone-acetaminophen 7.5-325 MG tablet Commonly known as:  NORCO Take 1-2 tablets by mouth every 4 (four) hours as needed for moderate pain.   HYDROcodone-acetaminophen 7.5-325 MG tablet Commonly known as:  NORCO Take 1-2 tablets by mouth every 4 (four) hours as needed for moderate pain.   JANUVIA 100 MG tablet Generic drug:  sitaGLIPtin TAKE 1 TABLET(100 MG) BY MOUTH DAILY What  changed:  See the new instructions.   methocarbamol 500 MG tablet Commonly known as:  ROBAXIN Take 1 tablet (500 mg total) by mouth every 6 (six) hours as needed for muscle spasms.   pioglitazone 30 MG tablet Commonly known as:  ACTOS Take 1 tablet (30 mg total) by mouth daily.   polyethylene glycol packet Commonly known as:  MIRALAX / GLYCOLAX Take 17 g by mouth 2 (two) times daily.   PROBIOTIC ACIDOPHILUS PO Take 1 capsule by mouth daily.   SYMBICORT 160-4.5 MCG/ACT inhaler Generic drug:  budesonide-formoterol INHALE 2 PUFFS INTO THE LUNGS TWICE DAILY What changed:    when to take this  reasons to take this            Discharge Care Instructions  (From admission, onward)         Start     Ordered   12/23/17 0000  Change dressing    Comments:  Maintain surgical dressing until follow up in the clinic. If the edges start to pull up, may reinforce with tape. If the dressing is no longer working, may remove and cover with gauze and tape, but must keep the area dry and clean.  Call with any questions or concerns.   12/23/17 0820           Signed: [place]. Arleatha Philipps   PA-C  12/28/2017, 2:41 PM

## 2018-01-26 ENCOUNTER — Other Ambulatory Visit: Payer: Self-pay | Admitting: Family Medicine

## 2018-02-11 DIAGNOSIS — Z96641 Presence of right artificial hip joint: Secondary | ICD-10-CM | POA: Diagnosis not present

## 2018-02-11 DIAGNOSIS — Z471 Aftercare following joint replacement surgery: Secondary | ICD-10-CM | POA: Diagnosis not present

## 2018-02-11 DIAGNOSIS — M1612 Unilateral primary osteoarthritis, left hip: Secondary | ICD-10-CM | POA: Diagnosis not present

## 2018-02-19 ENCOUNTER — Ambulatory Visit (INDEPENDENT_AMBULATORY_CARE_PROVIDER_SITE_OTHER): Payer: Medicare Other | Admitting: Family Medicine

## 2018-02-19 ENCOUNTER — Encounter: Payer: Self-pay | Admitting: Family Medicine

## 2018-02-19 VITALS — BP 128/70 | HR 75 | Temp 98.2°F | Resp 16 | Ht 65.0 in | Wt 173.5 lb

## 2018-02-19 DIAGNOSIS — Z8709 Personal history of other diseases of the respiratory system: Secondary | ICD-10-CM | POA: Diagnosis not present

## 2018-02-19 DIAGNOSIS — F17200 Nicotine dependence, unspecified, uncomplicated: Secondary | ICD-10-CM | POA: Diagnosis not present

## 2018-02-19 DIAGNOSIS — J441 Chronic obstructive pulmonary disease with (acute) exacerbation: Secondary | ICD-10-CM | POA: Diagnosis not present

## 2018-02-19 DIAGNOSIS — J069 Acute upper respiratory infection, unspecified: Secondary | ICD-10-CM

## 2018-02-19 MED ORDER — IPRATROPIUM-ALBUTEROL 0.5-2.5 (3) MG/3ML IN SOLN
3.0000 mL | Freq: Once | RESPIRATORY_TRACT | Status: AC
  Administered 2018-02-19: 3 mL via RESPIRATORY_TRACT

## 2018-02-19 MED ORDER — NEBULIZER/TUBING/MOUTHPIECE KIT: PACK | 0 refills | Status: DC

## 2018-02-19 MED ORDER — PREDNISONE 20 MG PO TABS: ORAL_TABLET | ORAL | 0 refills | Status: DC

## 2018-02-19 MED ORDER — AZITHROMYCIN 250 MG PO TABS: ORAL_TABLET | ORAL | 0 refills | Status: DC

## 2018-02-19 MED ORDER — BENZONATATE 100 MG PO CAPS: 100.0000 mg | ORAL_CAPSULE | Freq: Three times a day (TID) | ORAL | 0 refills | Status: DC | PRN

## 2018-02-19 MED ORDER — ALBUTEROL SULFATE HFA 108 (90 BASE) MCG/ACT IN AERS: 2.0000 | INHALATION_SPRAY | RESPIRATORY_TRACT | 0 refills | Status: DC | PRN

## 2018-02-19 MED ORDER — METHYLPREDNISOLONE ACETATE 80 MG/ML IJ SUSP: 80.0000 mg | Freq: Once | INTRAMUSCULAR | Status: DC

## 2018-02-19 MED ORDER — ALBUTEROL SULFATE (2.5 MG/3ML) 0.083% IN NEBU: 2.5000 mg | INHALATION_SOLUTION | Freq: Four times a day (QID) | RESPIRATORY_TRACT | 1 refills | Status: DC | PRN

## 2018-02-19 MED ORDER — HYDROCOD POLST-CPM POLST ER 10-8 MG/5ML PO SUER: 5.0000 mL | Freq: Two times a day (BID) | ORAL | 0 refills | Status: DC | PRN

## 2018-02-19 MED ORDER — GUAIFENESIN ER 600 MG PO TB12: 600.0000 mg | ORAL_TABLET | Freq: Two times a day (BID) | ORAL | 0 refills | Status: AC

## 2018-02-19 NOTE — Patient Instructions (Signed)
Follow up in 1-2 weeks if not improved Follow up sooner if any worsening.  I did longer steroids to help, since zpak has not helped much in the past.

## 2018-02-19 NOTE — Progress Notes (Addendum)
Patient ID: Kathleen Ramirez, female    DOB: 1945/09/07, 73 y.o.   MRN: [id number]  PCP: Susy Frizzle, MD  Chief Complaint  Patient presents with  . Cough    Patient has c/o cough, head congestion. Onset 2 weeks ago. Has tried mucinex, robitussin, claritin     Subjective:   Kathleen Ramirez is a 73 y.o. female, presents to clinic with CC of productive cough with green sputum, wheeze shortness of breath following 2 weeks of URI symptoms.  She states that it started with a head cold nasal drainage and severe sinus congestion and pressure.  She went to the pharmacy and they started her on Claritin, Mucinex and Robitussin.  She reports that some of the nasal congestion has improved she still feels little stuffy but she has no sinus pain or headaches.  Her cough has continued to worsen and is currently very severe, described as raw feeling in her chest, a burning feeling she is up all night coughing, is very short of breath and cannot take a deep breath because it triggers coughing fits.  She does have a history of COPD and asthma.  She quit smoking about 2 months ago says she can have hip surgery and she is due to have her other hip replaced in about 1 month.  She states that she has been compliant with her Symbicort but she has not been using albuterol.   She denies any chest pressure, near syncope, palpitations lower extremity edema.  She does have some pain burning in her central upper chest to her neck when she breathes or coughs.  She denies any fever but she has had some generalized fatigue malaise hot cold chills and sweats.   Patient Active Problem List   Diagnosis Date Noted  . Overweight (BMI 25.0-29.9) 12/23/2017  . S/P right THA, AA 12/22/2017  . S/P hip replacement 12/22/2017  . AORTIC STENOSIS, MILD 09/02/2009  . [place], BREAST 08/25/2009  . OSTEOARTHRITIS, HANDS, 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  . VAGINITIS, CANDIDAL 02/20/2009  . URI 02/20/2009  . GANGLION CYST, WRIST, RIGHT 10/20/2008   . Diabetes ([place]) 09/08/2008  . HYPERLIPIDEMIA, MIXED 08/23/2008  . POLYCYTHEMIA, SECONDARY 08/23/2008  . TOBACCO ABUSE 08/11/2008  . TROCHANTERIC BURSITIS, RIGHT 08/11/2008  . COLONIC POLYPS, ADENOMATOUS, HX OF 08/11/2008  . TINEA CORPORIS 01/28/2008  . DEPRESSION/ANXIETY 01/28/2008  . MACULAR DEGENERATION 01/28/2008  . Extrinsic asthma 01/28/2008  . COPD (chronic obstructive pulmonary disease) ([place]) 01/28/2008  . FREQUENCY, URINARY 01/28/2008  . SHOULDER PAIN, LEFT 05/05/2003  . CHEST PAIN 11/18/2002  . DIVERTICULOSIS OF COLON 12/10/2001     Prior to Admission medications   Medication Sig Start Date End Date Taking? Authorizing Provider  albuterol (PROVENTIL HFA;VENTOLIN HFA) 108 (90 Base) MCG/ACT inhaler INHALE 2 PUFFS BY MOUTH EVERY 6 HOURS AS NEEDED FOR WHEEZING& SHORTNESS OF BREATH Patient taking differently: Inhale 2 puffs into the lungs every 6 (six) hours as needed for wheezing or shortness of breath.  06/11/17  Yes Pickard, Cammie Mcgee, MD  atorvastatin (LIPITOR) 20 MG tablet TAKE 1 TABLET(20 MG) BY MOUTH DAILY Patient taking differently: Take 20 mg by mouth daily.  04/30/17  Yes Susy Frizzle, MD  CINNAMON PO Take 1 capsule by mouth daily.   Yes [provider]  ferrous sulfate (FERROUSUL) 325 (65 FE) MG tablet Take 1 tablet (325 mg total) by mouth 3 (three) times daily with meals. 12/22/17  Yes Babish, Matthew, PA-C  JANUVIA 100 MG tablet TAKE 1 TABLET(100 MG) BY MOUTH  DAILY Patient taking differently: Take 100 mg by mouth daily.  05/11/17  Yes Susy Frizzle, MD  Lactobacillus (PROBIOTIC ACIDOPHILUS PO) Take 1 capsule by mouth daily.   Yes [provider]  meloxicam (MOBIC) 7.5 MG tablet TAKE 1 TABLET(7.5 MG) BY MOUTH DAILY 01/26/18  Yes Susy Frizzle, MD  pioglitazone (ACTOS) 30 MG tablet Take 1 tablet (30 mg total) by mouth daily. Patient taking differently: Take 30 mg by mouth daily.  11/30/17  Yes Susy Frizzle, MD  SYMBICORT 160-4.5 MCG/ACT  inhaler INHALE 2 PUFFS INTO THE LUNGS TWICE DAILY Patient taking differently: Inhale 2 puffs into the lungs 2 (two) times daily as needed (asthma).  07/24/17  Yes Susy Frizzle, MD     Allergies  Allergen Reactions  . Codeine Nausea And Vomiting     makes heart race     Family History  Problem Relation Age of Onset  . Heart disease Mother      Social History   Socioeconomic History  . Marital status: Single    Spouse name: Not on file  . Number of children: Not on file  . Years of education: Not on file  . Highest education level: Not on file  Occupational History  . Not on file  Social Needs  . Financial resource strain: Not on file  . Food insecurity:    Worry: Not on file    Inability: Not on file  . Transportation needs:    Medical: Not on file    Non-medical: Not on file  Tobacco Use  . Smoking status: Former Smoker    Packs/day: 0.00    Types: Cigarettes    Last attempt to quit: 11/13/2017    Years since quitting: 0.2  . Smokeless tobacco: Never Used  Substance and Sexual Activity  . Alcohol use: Yes    Comment: occasionally  . Drug use: No  . Sexual activity: Not on file    Comment: single female partner  Lifestyle  . Physical activity:    Days per week: Not on file    Minutes per session: Not on file  . Stress: Not on file  Relationships  . Social connections:    Talks on phone: Not on file    Gets together: Not on file    Attends religious service: Not on file    Active member of club or organization: Not on file    Attends meetings of clubs or organizations: Not on file    Relationship status: Not on file  . Intimate partner violence:    Fear of current or ex partner: Not on file    Emotionally abused: Not on file    Physically abused: Not on file    Forced sexual activity: Not on file  Other Topics Concern  . Not on file  Social History Narrative  . Not on file     Review of Systems  Constitutional: Negative.   HENT: Negative.    Eyes: Negative.   Respiratory: Negative.   Cardiovascular: Negative.   Gastrointestinal: Negative.   Endocrine: Negative.   Genitourinary: Negative.   Musculoskeletal: Negative.   Skin: Negative.   Allergic/Immunologic: Negative.   Neurological: Negative.   Hematological: Negative.   Psychiatric/Behavioral: Negative.   All other systems reviewed and are negative.      Objective:    Vitals:   02/19/18 1010  BP: 128/70  Pulse: 69  Resp: 15  Temp: 98.2 F (36.8 C)  TempSrc: Oral  SpO2:  95%  Weight: 173 lb 8 oz (78.7 kg)  Height: '5\' 5"'  (1.651 m)      Physical Exam Vitals signs and nursing note reviewed.  Constitutional:      General: She is not in acute distress.    Appearance: She is well-developed. She is obese. She is ill-appearing. She is not toxic-appearing or diaphoretic.  HENT:     Head: Normocephalic and atraumatic.     Right Ear: Hearing, tympanic membrane, ear canal and external ear normal.     Left Ear: Hearing, tympanic membrane, ear canal and external ear normal.     Nose: Mucosal edema, congestion and rhinorrhea present.     Right Sinus: No maxillary sinus tenderness or frontal sinus tenderness.     Left Sinus: No maxillary sinus tenderness or frontal sinus tenderness.     Mouth/Throat:     Mouth: Mucous membranes are moist. Mucous membranes are not pale.     Pharynx: Uvula midline. Posterior oropharyngeal erythema present. No oropharyngeal exudate or uvula swelling.     Tonsils: No tonsillar abscesses.  Eyes:     General: No scleral icterus.       Right eye: No discharge.        Left eye: No discharge.     Conjunctiva/sclera: Conjunctivae normal.     Pupils: Pupils are equal, round, and reactive to light.  Neck:     Musculoskeletal: Normal range of motion and neck supple.     Trachea: No tracheal deviation.  Cardiovascular:     Rate and Rhythm: Normal rate and regular rhythm.     Pulses: Normal pulses.     Heart sounds: Normal heart sounds. No  murmur. No friction rub. No gallop.   Pulmonary:     Effort: Tachypnea present. No accessory muscle usage or respiratory distress.     Breath sounds: Decreased air movement (throughout) present. Wheezing (audible wheeze with coughing) and rhonchi (scattered) present.     Comments: Splinted respiratory effort, increased AP diameter, diminished BS throughout all lung fields, symmetrical chest expansion, with attempting to breath more deeply she has severe coughing fits, causing her face to turn red, eyes to water, with productive sputum and near post-tussive emesis Abdominal:     General: Bowel sounds are normal. There is no distension.     Palpations: Abdomen is soft.     Tenderness: There is no abdominal tenderness.  Musculoskeletal: Normal range of motion.     Right lower leg: No edema.     Left lower leg: No edema.  Lymphadenopathy:     Cervical: No cervical adenopathy.  Skin:    General: Skin is warm and dry.     Capillary Refill: Capillary refill takes less than 2 seconds.     Coloration: Skin is not jaundiced or pale.     Findings: No rash.  Neurological:     Mental Status: She is alert.     Motor: No abnormal muscle tone.     Coordination: Coordination normal.  Psychiatric:        Mood and Affect: Mood normal.        Behavior: Behavior normal.           Assessment & Plan:      ICD-10-CM   1. COPD with acute exacerbation (HCC) J44.1 ipratropium-albuterol (DUONEB) 0.5-2.5 (3) MG/3ML nebulizer solution 3 mL    predniSONE (DELTASONE) 20 MG tablet    azithromycin (ZITHROMAX) 250 MG tablet    benzonatate (TESSALON) 100 MG capsule  albuterol (PROVENTIL HFA;VENTOLIN HFA) 108 (90 Base) MCG/ACT inhaler    guaiFENesin (MUCINEX) 600 MG 12 hr tablet    albuterol (PROVENTIL) (2.5 MG/3ML) 0.083% nebulizer solution    Respiratory Therapy Supplies (NEBULIZER/TUBING/MOUTHPIECE) KIT    DISCONTINUED: methylPREDNISolone acetate (DEPO-MEDROL) injection 80 mg    DISCONTINUED:  Respiratory Therapy Supplies (NEBULIZER/TUBING/MOUTHPIECE) KIT   longer steroid tx, con't symbicort and mucinex, refills of albuterol, neb machine with alb vials for severe bronchospasma and SOB  2. TOBACCO ABUSE F17.200 predniSONE (DELTASONE) 20 MG tablet    azithromycin (ZITHROMAX) 250 MG tablet   stopped smoking 2 months ago  3. History of asthma Z87.09 ipratropium-albuterol (DUONEB) 0.5-2.5 (3) MG/3ML nebulizer solution 3 mL    predniSONE (DELTASONE) 20 MG tablet    albuterol (PROVENTIL HFA;VENTOLIN HFA) 108 (90 Base) MCG/ACT inhaler    albuterol (PROVENTIL) (2.5 MG/3ML) 0.083% nebulizer solution    Respiratory Therapy Supplies (NEBULIZER/TUBING/MOUTHPIECE) KIT    DISCONTINUED: Respiratory Therapy Supplies (NEBULIZER/TUBING/MOUTHPIECE) KIT  4. Upper respiratory tract infection, unspecified type J06.9    URI x 2 weeks, improved sinus pain/pressure but continued congestion/drainage, continue OTC meds     Pt is a 73 y/o female with med hx of COPD, asthma, current smoker, presents with URI sx with worsening productive cough, wheeze and sob.  She is using symbicort, but has not used albuterol, she is out.   On exam she is barrel chested, severely diminished breath sounds throughout difficulty taking a deep breath is having coughing fits cause her to turn red in her face her eyes to water and have productive sputum and near vomiting episodes, she was given a breathing treatment which she had to stop several times to cough and spit up her sputum and then she could restart them, she did feel better afterwards and her breath sounds were more audible in the upper to mid lung fields bilaterally continued to have coughing fits with deep inspiration and could hear some expiratory wheeze.  Patient is treatment for acute exacerbation of COPD and coverage with a longer steroid burst, nebulizer albuterol treatments in addition to rescue inhaler and continue Symbicort, Mucinex, cough suppressants, and Z-Pak to  cover for comorbidities.  Close follow up in the next 1-2 weeks if not improving   Delsa Grana, PA-C 02/19/18 10:36 AM  Patient has been instructed to use her albuterol nebulizer, 2.5 mg inhaled every 4-6 hours as needed for wheezing or bronchospasm.  This was conveyed to the patient at the time of her office visit.  This addendum is being made to clarify the directions given to the patient in order to have her nebulizer therapy covered by her insurance.

## 2018-02-22 ENCOUNTER — Encounter: Payer: Self-pay | Admitting: Family Medicine

## 2018-02-28 ENCOUNTER — Other Ambulatory Visit: Payer: Self-pay | Admitting: Family Medicine

## 2018-03-03 NOTE — H&P (Signed)
TOTAL HIP ADMISSION H&P  Patient is admitted for left total hip arthroplasty.  Subjective:  Chief Complaint:    Left hip primary OA / pain  HPI: Kathleen Ramirez, 73 y.o. female, has a history of pain and functional disability in the left hip due to arthritis and patient has failed non-surgical conservative treatments for greater than 12 weeks to include NSAID's and/or analgesics, use of assistive devices and activity modification.  Onset of symptoms was gradual starting 2+ years ago with gradually worsening course since that time.The patient noted prior procedures of the hip to include arthroplasty on the right hip per Dr. [name] on 12/22/17.  Patient currently rates pain in the left hip at 9 out of 10 with activity. Patient has night pain, worsening of pain with activity and weight bearing, trendelenberg gait, pain that interfers with activities of daily living and pain with passive range of motion. Patient has evidence of periarticular osteophytes and joint space narrowing by imaging studies. This condition presents safety issues increasing the risk of falls. There is no current active infection.  Risks, benefits and expectations were discussed with the patient.  Risks including but not limited to the risk of anesthesia, blood clots, nerve damage, blood vessel damage, failure of the prosthesis, infection and up to and including death.  Patient understand the risks, benefits and expectations and wishes to proceed with surgery.   PCP: Susy Frizzle, MD  D/C Plans:       Home  Post-op Meds:       No Rx given   Tranexamic Acid:      To be given - IV   Decadron:      Is to be given  FYI:      ASA  Norco  DME:   Pt already has equipment  PT:   No PT    Patient Active Problem List   Diagnosis Date Noted  . Overweight (BMI 25.0-29.9) 12/23/2017  . S/P right THA, AA 12/22/2017  . S/P hip replacement 12/22/2017  . AORTIC STENOSIS, MILD 09/02/2009  . [place], BREAST 08/25/2009  .  OSTEOARTHRITIS, HANDS, 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  . VAGINITIS, CANDIDAL 02/20/2009  . URI 02/20/2009  . GANGLION CYST, WRIST, RIGHT 10/20/2008  . Diabetes ([place]) 09/08/2008  . HYPERLIPIDEMIA, MIXED 08/23/2008  . POLYCYTHEMIA, SECONDARY 08/23/2008  . TOBACCO ABUSE 08/11/2008  . TROCHANTERIC BURSITIS, RIGHT 08/11/2008  . COLONIC POLYPS, ADENOMATOUS, HX OF 08/11/2008  . TINEA CORPORIS 01/28/2008  . DEPRESSION/ANXIETY 01/28/2008  . MACULAR DEGENERATION 01/28/2008  . Extrinsic asthma 01/28/2008  . COPD (chronic obstructive pulmonary disease) ([place]) 01/28/2008  . FREQUENCY, URINARY 01/28/2008  . SHOULDER PAIN, LEFT 05/05/2003  . CHEST PAIN 11/18/2002  . DIVERTICULOSIS OF COLON 12/10/2001   Past Medical History:  Diagnosis Date  . Bilateral ovarian tumors   . Cancer ([place])   . Cervical cancer ([place])   . Diabetes mellitus without complication ([place])   . Dysplastic colon polyp   . HLD (hyperlipidemia)   . Smoking   . Tumors    5tumors removed  . UTI (lower urinary tract infection)   . Yeast vaginitis     Past Surgical History:  Procedure Laterality Date  . ABDOMINAL HYSTERECTOMY    . APPENDECTOMY    . BLADDER NECK SUSPENSION    . BLADDER SURGERY    . BREAST LUMPECTOMY    . oophrectomy    . ORIF ULNAR FRACTURE  10/19/2011   Procedure: OPEN REDUCTION INTERNAL FIXATION (ORIF) ULNAR FRACTURE;  Surgeon: Linna Hoff,  MD;  Location: [facility];  Service: Orthopedics;  Laterality: Right;  . TONSILLECTOMY    . TOTAL HIP ARTHROPLASTY Right 12/22/2017   Procedure: RIGHT TOTAL HIP ARTHROPLASTY ANTERIOR APPROACH;  Surgeon: Paralee Cancel, MD;  Location: [facility];  Service: Orthopedics;  Laterality: Right;  71  . TUBAL LIGATION    . 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 0.9 %  sodium chloride infusion  500 mL Intravenous Continuous Irene Shipper, MD       Current  Outpatient Medications  Medication Sig Dispense Refill Last Dose  . albuterol (PROVENTIL HFA;VENTOLIN HFA) 108 (90 Base) MCG/ACT inhaler INHALE 2 PUFFS BY MOUTH EVERY 6 HOURS AS NEEDED FOR WHEEZING& SHORTNESS OF BREATH (Patient taking differently: Inhale 2 puffs into the lungs every 6 (six) hours as needed for wheezing or shortness of breath. ) 18 g 0 Taking  . albuterol (PROVENTIL HFA;VENTOLIN HFA) 108 (90 Base) MCG/ACT inhaler Inhale 2 puffs into the lungs every 4 (four) hours as needed for wheezing or shortness of breath. 1 Inhaler 0   . albuterol (PROVENTIL) (2.5 MG/3ML) 0.083% nebulizer solution Take 3 mLs (2.5 mg total) by nebulization every 6 (six) hours as needed for wheezing or shortness of breath. 150 mL 1   . atorvastatin (LIPITOR) 20 MG tablet TAKE 1 TABLET(20 MG) BY MOUTH DAILY 90 tablet 2   . azithromycin (ZITHROMAX) 250 MG tablet Take 2 tabs (500 mg) PO q d for 1d, then take 1 tab (250 mg) PO q d for day 2-5 6 each 0   . benzonatate (TESSALON) 100 MG capsule Take 1 capsule (100 mg total) by mouth 3 (three) times daily as needed for cough. 30 capsule 0   . chlorpheniramine-HYDROcodone (TUSSIONEX PENNKINETIC ER) 10-8 MG/5ML SUER Take 5 mLs by mouth every 12 (twelve) hours as needed for cough. 140 mL 0   . CINNAMON PO Take 1 capsule by mouth daily.   Taking  . ferrous sulfate (FERROUSUL) 325 (65 FE) MG tablet Take 1 tablet (325 mg total) by mouth 3 (three) times daily with meals.  3 Taking  . JANUVIA 100 MG tablet TAKE 1 TABLET(100 MG) BY MOUTH DAILY (Patient taking differently: Take 100 mg by mouth daily. ) 90 tablet 3 Taking  . Lactobacillus (PROBIOTIC ACIDOPHILUS PO) Take 1 capsule by mouth daily.   Taking  . meloxicam (MOBIC) 7.5 MG tablet TAKE 1 TABLET(7.5 MG) BY MOUTH DAILY 90 tablet 0 Taking  . pioglitazone (ACTOS) 30 MG tablet Take 1 tablet (30 mg total) by mouth daily. (Patient taking differently: Take 30 mg by mouth daily. ) 90 tablet 1 Taking  . predniSONE (DELTASONE) 20 MG  tablet Take 2 tabs PO qam x 5 d, then take 1 tab PO qam x 5 d 15 tablet 0   . Respiratory Therapy Supplies (NEBULIZER/TUBING/MOUTHPIECE) KIT Disp one nebulizer machine, tubing set and mouthpiece kit 1 each 0   . SYMBICORT 160-4.5 MCG/ACT inhaler INHALE 2 PUFFS INTO THE LUNGS TWICE DAILY (Patient taking differently: Inhale 2 puffs into the lungs 2 (two) times daily as needed (asthma). ) 10.2 g 5 Taking   Allergies  Allergen Reactions  . Codeine Nausea And Vomiting     makes heart race    Social History   Tobacco Use  . Smoking status: Former Smoker    Packs/day: 0.00    Types: Cigarettes  Last attempt to quit: 11/13/2017    Years since quitting: 0.3  . Smokeless tobacco: Never Used  Substance Use Topics  . Alcohol use: Yes    Comment: occasionally    Family History  Problem Relation Age of Onset  . Heart disease Mother      Review of Systems  Constitutional: Negative.   HENT: Negative.   Eyes: Negative.   Respiratory: Negative.   Cardiovascular: Negative.   Gastrointestinal: Negative.   Genitourinary: Positive for frequency.  Musculoskeletal: Positive for joint pain.  Skin: Negative.   Neurological: Negative.   Endo/Heme/Allergies: Negative.   Psychiatric/Behavioral: Positive for depression. The patient is nervous/anxious.     Objective:  Physical Exam  Constitutional: She is oriented to person, place, and time. She appears well-developed.  HENT:  Head: Normocephalic.  Mouth/Throat: She has dentures.  Eyes: Pupils are equal, round, and reactive to light.  Neck: Neck supple. No JVD present. No tracheal deviation present. No thyromegaly present.  Cardiovascular: Normal rate, regular rhythm and intact distal pulses.  Respiratory: Effort normal and breath sounds normal. No respiratory distress. She has no wheezes.  GI: Soft. There is no abdominal tenderness. There is no guarding.  Musculoskeletal:     Left hip: She exhibits decreased range of motion, decreased  strength, tenderness and bony tenderness. She exhibits no swelling, no deformity and no laceration.  Lymphadenopathy:    She has no cervical adenopathy.  Neurological: She is alert and oriented to person, place, and time.  Skin: Skin is warm and dry.  Psychiatric: She has a normal mood and affect.      Labs:  Estimated body mass index is 28.87 kg/m as calculated from the following:   Height as of 02/19/18: _0  (1.651 m).   Weight as of 02/19/18: 78.7 kg.   Imaging Review Plain radiographs demonstrate severe degenerative joint disease of the left hip(s). The bone quality appears to be good for age and reported activity level.    Preoperative templating of the joint replacement has been completed, documented, and submitted to the Operating Room personnel in order to optimize intra-operative equipment management.     Assessment/Plan:  End stage arthritis, left hip  The patient history, physical examination, clinical judgement of the provider and imaging studies are consistent with end stage degenerative joint disease of the left hip and total hip arthroplasty is deemed medically necessary. The treatment options including medical management, injection therapy, arthroscopy and arthroplasty were discussed at length. The risks and benefits of total hip arthroplasty were presented and reviewed. The risks due to aseptic loosening, infection, stiffness, dislocation/subluxation,  thromboembolic complications and other imponderables were discussed.  The patient acknowledged the explanation, agreed to proceed with the plan and consent was signed. Patient is being admitted for inpatient treatment for surgery, pain control, PT, OT, prophylactic antibiotics, VTE prophylaxis, progressive ambulation and ADL's and discharge planning.The patient is planning to be discharged home.     [place] Donnis Phaneuf   PA-C  03/03/2018, 11:47 AM

## 2018-03-12 ENCOUNTER — Encounter (HOSPITAL_COMMUNITY): Payer: Self-pay

## 2018-03-12 NOTE — Patient Instructions (Signed)
Your procedure is scheduled on: Tuesday, Feb. 4, 2020   Surgery Time:  2:20PM-3:30PM   Report to [facility] Main  Entrance    Report to admitting at 11:45 AM   Call this number if you have problems the morning of surgery [phone]   Do not eat food or drink liquids :After Midnight.   Brush your teeth the morning of surgery.   Do NOT smoke after Midnight   Take these medicines the morning of surgery with A SIP OF WATER: None   Bring Asthma Inhaler day of surgery  DO NOT TAKE ANY DIABETIC MEDICATIONS DAY OF YOUR SURGERY                               You may not have any metal on your body including hair pins, jewelry, and body piercings             Do not wear make-up, lotions, powders, perfumes/cologne, or deodorant             Do not wear nail polish.  Do not shave  48 hours prior to surgery.             Do not bring valuables to the hospital. [place].   Contacts, dentures or bridgework may not be worn into surgery.   Leave suitcase in the car. After surgery it may be brought to your room.    Patients discharged the day of surgery will not be allowed to drive home.              Please read over the following fact sheets you were given:  [facility] - Preparing for Surgery Before surgery, you can play an important role.  Because skin is not sterile, your skin needs to be as free of germs as possible.  You can reduce the number of germs on your skin by washing with CHG (chlorahexidine gluconate) soap before surgery.  CHG is an antiseptic cleaner which kills germs and bonds with the skin to continue killing germs even after washing. Please DO NOT use if you have an allergy to CHG or antibacterial soaps.  If your skin becomes reddened/irritated stop using the CHG and inform your nurse when you arrive at Short Stay. Do not shave (including legs and underarms) for at least 48 hours prior to the first CHG shower.   You may shave your face/neck.  Please follow these instructions carefully:  1.  Shower with CHG Soap the night before surgery and the  morning of surgery.  2.  If you choose to wash your hair, wash your hair first as usual with your normal  shampoo.  3.  After you shampoo, rinse your hair and body thoroughly to remove the shampoo.                             4.  Use CHG as you would any other liquid soap.  You can apply chg directly to the skin and wash.  Gently with a scrungie or clean washcloth.  5.  Apply the CHG Soap to your body ONLY FROM THE NECK DOWN.   Do   not use on face/ open  Wound or open sores. Avoid contact with eyes, ears mouth and   genitals (private parts).                       Wash face,  Genitals (private parts) with your normal soap.             6.  Wash thoroughly, paying special attention to the area where your    surgery  will be performed.  7.  Thoroughly rinse your body with warm water from the neck down.  8.  DO NOT shower/wash with your normal soap after using and rinsing off the CHG Soap.                9.  Pat yourself dry with a clean towel.            10.  Wear clean pajamas.            11.  Place clean sheets on your bed the night of your first shower and do not  sleep with pets. Day of Surgery : Do not apply any lotions/deodorants the morning of surgery.  Please wear clean clothes to the hospital/surgery center.  FAILURE TO FOLLOW THESE INSTRUCTIONS MAY RESULT IN THE CANCELLATION OF YOUR SURGERY  PATIENT SIGNATURE_________________________________  NURSE SIGNATURE__________________________________  ________________________________________________________________________   Kathleen Ramirez  An incentive spirometer is a tool that can help keep your lungs clear and active. This tool measures how well you are filling your lungs with each breath. Taking long deep breaths may help reverse or decrease the chance of developing  breathing (pulmonary) problems (especially infection) following:  A long period of time when you are unable to move or be active. BEFORE THE PROCEDURE   If the spirometer includes an indicator to show your best effort, your nurse or respiratory therapist will set it to a desired goal.  If possible, sit up straight or lean slightly forward. Try not to slouch.  Hold the incentive spirometer in an upright position. INSTRUCTIONS FOR USE  1. Sit on the edge of your bed if possible, or sit up as far as you can in bed or on a chair. 2. Hold the incentive spirometer in an upright position. 3. Breathe out normally. 4. Place the mouthpiece in your mouth and seal your lips tightly around it. 5. Breathe in slowly and as deeply as possible, raising the piston or the ball toward the top of the column. 6. Hold your breath for 3-5 seconds or for as long as possible. Allow the piston or ball to fall to the bottom of the column. 7. Remove the mouthpiece from your mouth and breathe out normally. 8. Rest for a few seconds and repeat Steps 1 through 7 at least 10 times every 1-2 hours when you are awake. Take your time and take a few normal breaths between deep breaths. 9. The spirometer may include an indicator to show your best effort. Use the indicator as a goal to work toward during each repetition. 10. After each set of 10 deep breaths, practice coughing to be sure your lungs are clear. If you have an incision (the cut made at the time of surgery), support your incision when coughing by placing a pillow or rolled up towels firmly against it. Once you are able to get out of bed, walk around indoors and cough well. You may stop using the incentive spirometer when instructed by your caregiver.  RISKS AND COMPLICATIONS  Take your time  so you do not get dizzy or light-headed.  If you are in pain, you may need to take or ask for pain medication before doing incentive spirometry. It is harder to take a deep  breath if you are having pain. AFTER USE  Rest and breathe slowly and easily.  It can be helpful to keep track of a log of your progress. Your caregiver can provide you with a simple table to help with this. If you are using the spirometer at home, follow these instructions: [place] IF:   You are having difficultly using the spirometer.  You have trouble using the spirometer as often as instructed.  Your pain medication is not giving enough relief while using the spirometer.  You develop fever of 100.5 F (38.1 C) or higher. SEEK IMMEDIATE MEDICAL CARE IF:   You cough up bloody sputum that had not been present before.  You develop fever of 102 F (38.9 C) or greater.  You develop worsening pain at or near the incision site. MAKE SURE YOU:   Understand these instructions.  Will watch your condition.  Will get help right away if you are not doing well or get worse. Document Released: 06/16/2006 Document Revised: 04/28/2011 Document Reviewed: 08/17/2006 Exit[employer] Patient Information 2014 Exit[employer], [place].   ________________________________________________________________________  WHAT IS A BLOOD TRANSFUSION? Blood Transfusion Information  A transfusion is the replacement of blood or some of its parts. Blood is made up of multiple cells which provide different functions.  Red blood cells carry oxygen and are used for blood loss replacement.  White blood cells fight against infection.  Platelets control bleeding.  Plasma helps clot blood.  Other blood products are available for specialized needs, such as hemophilia or other clotting disorders. BEFORE THE TRANSFUSION  Who gives blood for transfusions?   Healthy volunteers who are fully evaluated to make sure their blood is safe. This is blood bank blood. Transfusion therapy is the safest it has ever been in the practice of medicine. Before blood is taken from a donor, a complete history is taken to make sure  that person has no history of diseases nor engages in risky social behavior (examples are intravenous drug use or sexual activity with multiple partners). The donor's travel history is screened to minimize risk of transmitting infections, such as malaria. The donated blood is tested for signs of infectious diseases, such as HIV and hepatitis. The blood is then tested to be sure it is compatible with you in order to minimize the chance of a transfusion reaction. If you or a relative donates blood, this is often done in anticipation of surgery and is not appropriate for emergency situations. It takes many days to process the donated blood. RISKS AND COMPLICATIONS Although transfusion therapy is very safe and saves many lives, the main dangers of transfusion include:   Getting an infectious disease.  Developing a transfusion reaction. This is an allergic reaction to something in the blood you were given. Every precaution is taken to prevent this. The decision to have a blood transfusion has been considered carefully by your caregiver before blood is given. Blood is not given unless the benefits outweigh the risks. AFTER THE TRANSFUSION  Right after receiving a blood transfusion, you will usually feel much better and more energetic. This is especially true if your red blood cells have gotten low (anemic). The transfusion raises the level of the red blood cells which carry oxygen, and this usually causes an energy increase.  The  nurse administering the transfusion will monitor you carefully for complications. HOME CARE INSTRUCTIONS  No special instructions are needed after a transfusion. You may find your energy is better. Speak with your caregiver about any limitations on activity for underlying diseases you may have. SEEK MEDICAL CARE IF:   Your condition is not improving after your transfusion.  You develop redness or irritation at the intravenous (IV) site. SEEK IMMEDIATE MEDICAL CARE IF:  Any of  the following symptoms occur over the next 12 hours:  Shaking chills.  You have a temperature by mouth above 102 F (38.9 C), not controlled by medicine.  Chest, back, or muscle pain.  People around you feel you are not acting correctly or are confused.  Shortness of breath or difficulty breathing.  Dizziness and fainting.  You get a rash or develop hives.  You have a decrease in urine output.  Your urine turns a dark color or changes to pink, red, or brown. Any of the following symptoms occur over the next 10 days:  You have a temperature by mouth above 102 F (38.9 C), not controlled by medicine.  Shortness of breath.  Weakness after normal activity.  The white part of the eye turns yellow (jaundice).  You have a decrease in the amount of urine or are urinating less often.  Your urine turns a dark color or changes to pink, red, or brown. Document Released: 02/01/2000 Document Revised: 04/28/2011 Document Reviewed: 09/20/2007 [facility] Patient Information 2014 [place], [place].  _______________________________________________________________________

## 2018-03-12 NOTE — Pre-Procedure Instructions (Signed)
The following are in epic: Last office visit Delsa Grana 02/19/2018 EKG 12/16/2017

## 2018-03-16 ENCOUNTER — Encounter (HOSPITAL_COMMUNITY): Payer: Self-pay

## 2018-03-16 ENCOUNTER — Encounter (HOSPITAL_COMMUNITY)
Admission: RE | Admit: 2018-03-16 | Discharge: 2018-03-16 | Disposition: A | Discharge status: Home / Self Care | Payer: Medicare Other | Source: Ambulatory Visit | Attending: Orthopedic Surgery | Admitting: Orthopedic Surgery

## 2018-03-16 ENCOUNTER — Other Ambulatory Visit: Payer: Self-pay

## 2018-03-16 DIAGNOSIS — M1612 Unilateral primary osteoarthritis, left hip: Secondary | ICD-10-CM | POA: Insufficient documentation

## 2018-03-16 DIAGNOSIS — Z01812 Encounter for preprocedural laboratory examination: Secondary | ICD-10-CM | POA: Diagnosis not present

## 2018-03-16 HISTORY — DX: Personal history of colon polyps, unspecified: Z86.0100

## 2018-03-16 HISTORY — DX: Other hemorrhoids: K64.8

## 2018-03-16 HISTORY — DX: Benign neoplasm of unspecified breast: D24.9

## 2018-03-16 HISTORY — DX: Frequency of micturition: R35.0

## 2018-03-16 HISTORY — DX: Secondary polycythemia: D75.1

## 2018-03-16 HISTORY — DX: Unspecified macular degeneration: H35.30

## 2018-03-16 HISTORY — DX: Other specified postprocedural states: Z98.890

## 2018-03-16 HISTORY — DX: Chronic obstructive pulmonary disease, unspecified: J44.9

## 2018-03-16 HISTORY — DX: Other intervertebral disc degeneration, lumbar region: M51.36

## 2018-03-16 HISTORY — DX: Other cervical disc degeneration, unspecified cervical region: M50.30

## 2018-03-16 HISTORY — DX: Ganglion, right wrist: M67.431

## 2018-03-16 HISTORY — DX: Personal history of colonic polyps: Z86.010

## 2018-03-16 HISTORY — DX: Unspecified fracture of unspecified forearm, initial encounter for closed fracture: S52.90XA

## 2018-03-16 HISTORY — DX: Unspecified cataract: H26.9

## 2018-03-16 HISTORY — DX: Other intervertebral disc degeneration, lumbar region without mention of lumbar back pain or lower extremity pain: M51.369

## 2018-03-16 HISTORY — DX: Nausea with vomiting, unspecified: R11.2

## 2018-03-16 HISTORY — DX: Unspecified asthma, uncomplicated: J45.909

## 2018-03-16 HISTORY — DX: Diverticulosis of large intestine without perforation or abscess without bleeding: K57.30

## 2018-03-16 HISTORY — DX: Nonrheumatic aortic (valve) stenosis: I35.0

## 2018-03-16 LAB — GLUCOSE, CAPILLARY: Glucose-Capillary: 145 mg/dL — ABNORMAL HIGH (ref 70–99)

## 2018-03-16 LAB — HEMOGLOBIN A1C
Hgb A1c MFr Bld: 7.2 % — ABNORMAL HIGH (ref 4.8–5.6)
Mean Plasma Glucose: 159.94 mg/dL

## 2018-03-16 LAB — CBC
HCT: 49.3 % — ABNORMAL HIGH (ref 36.0–46.0)
Hemoglobin: 15.4 g/dL — ABNORMAL HIGH (ref 12.0–15.0)
MCH: 28.5 pg (ref 26.0–34.0)
MCHC: 31.2 g/dL (ref 30.0–36.0)
MCV: 91.3 fL (ref 80.0–100.0)
Platelets: 206 10*3/uL (ref 150–400)
RBC: 5.4 MIL/uL — ABNORMAL HIGH (ref 3.87–5.11)
RDW: 12.5 % (ref 11.5–15.5)
WBC: 8.7 10*3/uL (ref 4.0–10.5)
nRBC: 0 % (ref 0.0–0.2)

## 2018-03-16 LAB — SURGICAL PCR SCREEN
MRSA, PCR: NEGATIVE
Staphylococcus aureus: NEGATIVE

## 2018-03-16 NOTE — Progress Notes (Signed)
CBC and Hgb A1c results 03/16/2018 sent to Dr. [name] via epic.

## 2018-03-23 ENCOUNTER — Inpatient Hospital Stay (HOSPITAL_COMMUNITY): Payer: Medicare Other

## 2018-03-23 ENCOUNTER — Inpatient Hospital Stay (HOSPITAL_COMMUNITY): Payer: Medicare Other | Admitting: Certified Registered Nurse Anesthetist

## 2018-03-23 ENCOUNTER — Inpatient Hospital Stay (HOSPITAL_COMMUNITY): Payer: Medicare Other | Admitting: Physician Assistant

## 2018-03-23 ENCOUNTER — Encounter (HOSPITAL_COMMUNITY)
Admission: RE | Disposition: A | Discharge status: Home / Self Care | Payer: Self-pay | Source: Home / Self Care | Attending: Orthopedic Surgery

## 2018-03-23 ENCOUNTER — Inpatient Hospital Stay (HOSPITAL_COMMUNITY)
Admission: RE | Admit: 2018-03-23 | Discharge: 2018-03-24 | DRG: 470 | Disposition: A | Discharge status: Home / Self Care | Payer: Medicare Other | Attending: Orthopedic Surgery | Admitting: Orthopedic Surgery

## 2018-03-23 ENCOUNTER — Other Ambulatory Visit: Payer: Self-pay

## 2018-03-23 ENCOUNTER — Encounter (HOSPITAL_COMMUNITY): Payer: Self-pay | Admitting: *Deleted

## 2018-03-23 DIAGNOSIS — J449 Chronic obstructive pulmonary disease, unspecified: Secondary | ICD-10-CM | POA: Diagnosis not present

## 2018-03-23 DIAGNOSIS — E119 Type 2 diabetes mellitus without complications: Secondary | ICD-10-CM | POA: Diagnosis present

## 2018-03-23 DIAGNOSIS — Z79899 Other long term (current) drug therapy: Secondary | ICD-10-CM

## 2018-03-23 DIAGNOSIS — Z7951 Long term (current) use of inhaled steroids: Secondary | ICD-10-CM | POA: Diagnosis not present

## 2018-03-23 DIAGNOSIS — Z96641 Presence of right artificial hip joint: Secondary | ICD-10-CM | POA: Diagnosis present

## 2018-03-23 DIAGNOSIS — Z96649 Presence of unspecified artificial hip joint: Secondary | ICD-10-CM

## 2018-03-23 DIAGNOSIS — Z90710 Acquired absence of both cervix and uterus: Secondary | ICD-10-CM | POA: Diagnosis not present

## 2018-03-23 DIAGNOSIS — E663 Overweight: Secondary | ICD-10-CM | POA: Diagnosis not present

## 2018-03-23 DIAGNOSIS — E782 Mixed hyperlipidemia: Secondary | ICD-10-CM | POA: Diagnosis present

## 2018-03-23 DIAGNOSIS — Z7984 Long term (current) use of oral hypoglycemic drugs: Secondary | ICD-10-CM | POA: Diagnosis not present

## 2018-03-23 DIAGNOSIS — I350 Nonrheumatic aortic (valve) stenosis: Secondary | ICD-10-CM | POA: Diagnosis present

## 2018-03-23 DIAGNOSIS — Z96642 Presence of left artificial hip joint: Secondary | ICD-10-CM

## 2018-03-23 DIAGNOSIS — Z6827 Body mass index (BMI) 27.0-27.9, adult: Secondary | ICD-10-CM

## 2018-03-23 DIAGNOSIS — Z419 Encounter for procedure for purposes other than remedying health state, unspecified: Secondary | ICD-10-CM

## 2018-03-23 DIAGNOSIS — Z87891 Personal history of nicotine dependence: Secondary | ICD-10-CM

## 2018-03-23 DIAGNOSIS — Z9181 History of falling: Secondary | ICD-10-CM

## 2018-03-23 DIAGNOSIS — Z8541 Personal history of malignant neoplasm of cervix uteri: Secondary | ICD-10-CM

## 2018-03-23 DIAGNOSIS — M1612 Unilateral primary osteoarthritis, left hip: Principal | ICD-10-CM | POA: Diagnosis present

## 2018-03-23 DIAGNOSIS — Z471 Aftercare following joint replacement surgery: Secondary | ICD-10-CM | POA: Diagnosis not present

## 2018-03-23 HISTORY — PX: TOTAL HIP ARTHROPLASTY: SHX124

## 2018-03-23 LAB — POCT I-STAT EG7
Acid-Base Excess: 1 mmol/L (ref 0.0–2.0)
Bicarbonate: 27.7 mmol/L (ref 20.0–28.0)
Calcium, Ion: 1.21 mmol/L (ref 1.15–1.40)
HCT: 43 % (ref 36.0–46.0)
Hemoglobin: 14.6 g/dL (ref 12.0–15.0)
O2 Saturation: 100 %
PO2 VEN: 197 mmHg — AB (ref 32.0–45.0)
Potassium: 3.8 mmol/L (ref 3.5–5.1)
Sodium: 138 mmol/L (ref 135–145)
TCO2: 29 mmol/L (ref 22–32)
pCO2, Ven: 48.8 mmHg (ref 44.0–60.0)
pH, Ven: 7.361 (ref 7.250–7.430)

## 2018-03-23 LAB — TYPE AND SCREEN
ABO/RH(D): A POS
Antibody Screen: NEGATIVE

## 2018-03-23 LAB — GLUCOSE, CAPILLARY
GLUCOSE-CAPILLARY: 136 mg/dL — AB (ref 70–99)
GLUCOSE-CAPILLARY: 151 mg/dL — AB (ref 70–99)
GLUCOSE-CAPILLARY: 174 mg/dL — AB (ref 70–99)
Glucose-Capillary: 120 mg/dL — ABNORMAL HIGH (ref 70–99)

## 2018-03-23 SURGERY — ARTHROPLASTY, HIP, TOTAL, ANTERIOR APPROACH
Anesthesia: General | Site: Hip | Laterality: Left

## 2018-03-23 MED ORDER — STERILE WATER FOR IRRIGATION IR SOLN
Status: DC | PRN
  Administered 2018-03-23: 2000 mL

## 2018-03-23 MED ORDER — ONDANSETRON HCL 4 MG/2ML IJ SOLN
INTRAMUSCULAR | Status: AC
  Filled 2018-03-23: qty 2

## 2018-03-23 MED ORDER — HYDROCODONE-ACETAMINOPHEN 7.5-325 MG PO TABS
1.0000 | ORAL_TABLET | ORAL | Status: DC | PRN
  Administered 2018-03-23 – 2018-03-24 (×2): 2 via ORAL
  Filled 2018-03-23 (×2): qty 2

## 2018-03-23 MED ORDER — CHLORHEXIDINE GLUCONATE 4 % EX LIQD
60.0000 mL | Freq: Once | CUTANEOUS | Status: AC
  Administered 2018-03-23: 4 via TOPICAL

## 2018-03-23 MED ORDER — ONDANSETRON HCL 4 MG/2ML IJ SOLN
4.0000 mg | Freq: Four times a day (QID) | INTRAMUSCULAR | Status: DC | PRN
  Filled 2018-03-23: qty 2

## 2018-03-23 MED ORDER — ATORVASTATIN CALCIUM 20 MG PO TABS
20.0000 mg | ORAL_TABLET | Freq: Every evening | ORAL | Status: DC
  Administered 2018-03-23: 20 mg via ORAL
  Filled 2018-03-23: qty 1

## 2018-03-23 MED ORDER — ROCURONIUM BROMIDE 10 MG/ML (PF) SYRINGE
PREFILLED_SYRINGE | INTRAVENOUS | Status: DC | PRN
  Administered 2018-03-23: 40 mg via INTRAVENOUS

## 2018-03-23 MED ORDER — CEFAZOLIN SODIUM-DEXTROSE 2-4 GM/100ML-% IV SOLN
2.0000 g | Freq: Four times a day (QID) | INTRAVENOUS | Status: AC
  Administered 2018-03-23 – 2018-03-24 (×2): 2 g via INTRAVENOUS
  Filled 2018-03-23 (×2): qty 100

## 2018-03-23 MED ORDER — SUGAMMADEX SODIUM 200 MG/2ML IV SOLN
INTRAVENOUS | Status: AC
  Filled 2018-03-23: qty 2

## 2018-03-23 MED ORDER — PROPOFOL 10 MG/ML IV BOLUS
INTRAVENOUS | Status: AC
  Filled 2018-03-23: qty 60

## 2018-03-23 MED ORDER — METHOCARBAMOL 500 MG PO TABS: 500.0000 mg | ORAL_TABLET | Freq: Four times a day (QID) | ORAL | 0 refills | Status: DC | PRN

## 2018-03-23 MED ORDER — ALUM & MAG HYDROXIDE-SIMETH 200-200-20 MG/5ML PO SUSP: 15.0000 mL | ORAL | Status: DC | PRN

## 2018-03-23 MED ORDER — ALBUTEROL SULFATE (2.5 MG/3ML) 0.083% IN NEBU: 3.0000 mL | INHALATION_SOLUTION | RESPIRATORY_TRACT | Status: DC | PRN

## 2018-03-23 MED ORDER — DIPHENHYDRAMINE HCL 12.5 MG/5ML PO ELIX: 12.5000 mg | ORAL_SOLUTION | ORAL | Status: DC | PRN

## 2018-03-23 MED ORDER — ASPIRIN 81 MG PO CHEW
81.0000 mg | CHEWABLE_TABLET | Freq: Two times a day (BID) | ORAL | Status: DC
  Administered 2018-03-23 – 2018-03-24 (×2): 81 mg via ORAL
  Filled 2018-03-23 (×2): qty 1

## 2018-03-23 MED ORDER — ACETAMINOPHEN 325 MG PO TABS: 325.0000 mg | ORAL_TABLET | Freq: Four times a day (QID) | ORAL | Status: DC | PRN

## 2018-03-23 MED ORDER — SODIUM CHLORIDE 0.9 % IV SOLN
INTRAVENOUS | Status: DC | PRN
  Administered 2018-03-23: 75 ug/min via INTRAVENOUS

## 2018-03-23 MED ORDER — ONDANSETRON HCL 4 MG PO TABS: 4.0000 mg | ORAL_TABLET | Freq: Three times a day (TID) | ORAL | 1 refills | Status: DC | PRN

## 2018-03-23 MED ORDER — FENTANYL CITRATE (PF) 100 MCG/2ML IJ SOLN
INTRAMUSCULAR | Status: AC
  Filled 2018-03-23: qty 2

## 2018-03-23 MED ORDER — ONDANSETRON HCL 4 MG PO TABS: 4.0000 mg | ORAL_TABLET | Freq: Four times a day (QID) | ORAL | Status: DC | PRN

## 2018-03-23 MED ORDER — HYDROCODONE-ACETAMINOPHEN 7.5-325 MG PO TABS: 1.0000 | ORAL_TABLET | ORAL | 0 refills | Status: DC | PRN

## 2018-03-23 MED ORDER — FERROUS SULFATE 325 (65 FE) MG PO TABS
325.0000 mg | ORAL_TABLET | Freq: Three times a day (TID) | ORAL | Status: DC
  Administered 2018-03-23 – 2018-03-24 (×2): 325 mg via ORAL
  Filled 2018-03-23 (×2): qty 1

## 2018-03-23 MED ORDER — CEFAZOLIN SODIUM-DEXTROSE 2-4 GM/100ML-% IV SOLN
2.0000 g | INTRAVENOUS | Status: AC
  Administered 2018-03-23: 2 g via INTRAVENOUS
  Filled 2018-03-23: qty 100

## 2018-03-23 MED ORDER — MIDAZOLAM HCL (PF) 5 MG/ML IJ SOLN: 0.5000 mg | Freq: Once | INTRAMUSCULAR | Status: DC

## 2018-03-23 MED ORDER — FERROUS SULFATE 325 (65 FE) MG PO TABS: 325.0000 mg | ORAL_TABLET | Freq: Three times a day (TID) | ORAL | 3 refills | Status: DC

## 2018-03-23 MED ORDER — LIDOCAINE 2% (20 MG/ML) 5 ML SYRINGE
INTRAMUSCULAR | Status: AC
  Filled 2018-03-23: qty 5

## 2018-03-23 MED ORDER — ALBUMIN HUMAN 5 % IV SOLN
INTRAVENOUS | Status: AC
  Filled 2018-03-23: qty 250

## 2018-03-23 MED ORDER — ONDANSETRON HCL 4 MG/2ML IJ SOLN
INTRAMUSCULAR | Status: DC | PRN
  Administered 2018-03-23 (×2): 4 mg via INTRAVENOUS

## 2018-03-23 MED ORDER — DEXAMETHASONE SODIUM PHOSPHATE 10 MG/ML IJ SOLN
INTRAMUSCULAR | Status: AC
  Filled 2018-03-23: qty 1

## 2018-03-23 MED ORDER — FENTANYL CITRATE (PF) 100 MCG/2ML IJ SOLN
INTRAMUSCULAR | Status: DC | PRN
  Administered 2018-03-23 (×2): 100 ug via INTRAVENOUS

## 2018-03-23 MED ORDER — PHENOL 1.4 % MT LIQD
1.0000 | OROMUCOSAL | Status: DC | PRN
  Filled 2018-03-23: qty 177

## 2018-03-23 MED ORDER — PHENYLEPHRINE HCL 10 MG/ML IJ SOLN
INTRAMUSCULAR | Status: AC
  Filled 2018-03-23: qty 1

## 2018-03-23 MED ORDER — HYDROCODONE-ACETAMINOPHEN 5-325 MG PO TABS
1.0000 | ORAL_TABLET | ORAL | Status: DC | PRN
  Administered 2018-03-24 (×2): 2 via ORAL
  Filled 2018-03-23 (×3): qty 2

## 2018-03-23 MED ORDER — DIPHENHYDRAMINE HCL 50 MG/ML IJ SOLN
INTRAMUSCULAR | Status: DC | PRN
  Administered 2018-03-23: 12.5 mg via INTRAVENOUS

## 2018-03-23 MED ORDER — ALBUTEROL SULFATE (2.5 MG/3ML) 0.083% IN NEBU
INHALATION_SOLUTION | RESPIRATORY_TRACT | Status: AC
  Administered 2018-03-23: 2.5 mg
  Filled 2018-03-23: qty 3

## 2018-03-23 MED ORDER — CELECOXIB 200 MG PO CAPS
200.0000 mg | ORAL_CAPSULE | Freq: Two times a day (BID) | ORAL | Status: DC
  Administered 2018-03-23 – 2018-03-24 (×2): 200 mg via ORAL
  Filled 2018-03-23 (×2): qty 1

## 2018-03-23 MED ORDER — LACTATED RINGERS IV SOLN
INTRAVENOUS | Status: DC
  Administered 2018-03-23 (×2): via INTRAVENOUS

## 2018-03-23 MED ORDER — MENTHOL 3 MG MT LOZG: 1.0000 | LOZENGE | OROMUCOSAL | Status: DC | PRN

## 2018-03-23 MED ORDER — ALBUMIN HUMAN 5 % IV SOLN
INTRAVENOUS | Status: DC | PRN
  Administered 2018-03-23: 15:00:00 via INTRAVENOUS

## 2018-03-23 MED ORDER — POLYETHYLENE GLYCOL 3350 17 G PO PACK
17.0000 g | PACK | Freq: Two times a day (BID) | ORAL | Status: DC
  Filled 2018-03-23 (×2): qty 1

## 2018-03-23 MED ORDER — DEXAMETHASONE SODIUM PHOSPHATE 10 MG/ML IJ SOLN: 10.0000 mg | Freq: Once | INTRAMUSCULAR | Status: DC

## 2018-03-23 MED ORDER — METOCLOPRAMIDE HCL 5 MG/ML IJ SOLN: 5.0000 mg | Freq: Three times a day (TID) | INTRAMUSCULAR | Status: DC | PRN

## 2018-03-23 MED ORDER — ASPIRIN 81 MG PO CHEW: 81.0000 mg | CHEWABLE_TABLET | Freq: Two times a day (BID) | ORAL | 0 refills | Status: AC

## 2018-03-23 MED ORDER — POLYETHYLENE GLYCOL 3350 17 G PO PACK: 17.0000 g | PACK | Freq: Two times a day (BID) | ORAL | 0 refills | Status: DC

## 2018-03-23 MED ORDER — LINAGLIPTIN 5 MG PO TABS
5.0000 mg | ORAL_TABLET | Freq: Every day | ORAL | Status: DC
  Administered 2018-03-23 – 2018-03-24 (×2): 5 mg via ORAL
  Filled 2018-03-23 (×2): qty 1

## 2018-03-23 MED ORDER — SODIUM CHLORIDE 0.9 % IV SOLN
INTRAVENOUS | Status: DC
  Administered 2018-03-24: via INTRAVENOUS

## 2018-03-23 MED ORDER — DOCUSATE SODIUM 100 MG PO CAPS
100.0000 mg | ORAL_CAPSULE | Freq: Two times a day (BID) | ORAL | Status: DC
  Administered 2018-03-23: 100 mg via ORAL
  Filled 2018-03-23 (×2): qty 1

## 2018-03-23 MED ORDER — MAGNESIUM CITRATE PO SOLN: 1.0000 | Freq: Once | ORAL | Status: DC | PRN

## 2018-03-23 MED ORDER — PROPOFOL 10 MG/ML IV BOLUS
INTRAVENOUS | Status: DC | PRN
  Administered 2018-03-23: 150 mg via INTRAVENOUS

## 2018-03-23 MED ORDER — TRANEXAMIC ACID-NACL 1000-0.7 MG/100ML-% IV SOLN
1000.0000 mg | INTRAVENOUS | Status: AC
  Administered 2018-03-23: 1000 mg via INTRAVENOUS
  Filled 2018-03-23: qty 100

## 2018-03-23 MED ORDER — HYDROMORPHONE HCL 1 MG/ML IJ SOLN: 0.5000 mg | INTRAMUSCULAR | Status: DC | PRN

## 2018-03-23 MED ORDER — LIDOCAINE 2% (20 MG/ML) 5 ML SYRINGE
INTRAMUSCULAR | Status: DC | PRN
  Administered 2018-03-23: 60 mg via INTRAVENOUS

## 2018-03-23 MED ORDER — DIPHENHYDRAMINE HCL 50 MG/ML IJ SOLN
INTRAMUSCULAR | Status: AC
  Filled 2018-03-23: qty 1

## 2018-03-23 MED ORDER — MOMETASONE FURO-FORMOTEROL FUM 200-5 MCG/ACT IN AERO
2.0000 | INHALATION_SPRAY | Freq: Two times a day (BID) | RESPIRATORY_TRACT | Status: DC
  Administered 2018-03-23 – 2018-03-24 (×2): 2 via RESPIRATORY_TRACT
  Filled 2018-03-23: qty 8.8

## 2018-03-23 MED ORDER — SODIUM CHLORIDE 0.9 % IR SOLN
Status: DC | PRN
  Administered 2018-03-23: 1000 mL

## 2018-03-23 MED ORDER — FENTANYL CITRATE (PF) 100 MCG/2ML IJ SOLN
25.0000 ug | INTRAMUSCULAR | Status: DC | PRN
  Administered 2018-03-23 (×3): 50 ug via INTRAVENOUS

## 2018-03-23 MED ORDER — SUGAMMADEX SODIUM 200 MG/2ML IV SOLN
INTRAVENOUS | Status: DC | PRN
  Administered 2018-03-23: 200 mg via INTRAVENOUS

## 2018-03-23 MED ORDER — DOCUSATE SODIUM 100 MG PO CAPS: 100.0000 mg | ORAL_CAPSULE | Freq: Two times a day (BID) | ORAL | 0 refills | Status: DC

## 2018-03-23 MED ORDER — METHOCARBAMOL 500 MG PO TABS
500.0000 mg | ORAL_TABLET | Freq: Four times a day (QID) | ORAL | Status: DC | PRN
  Administered 2018-03-24 (×2): 500 mg via ORAL
  Filled 2018-03-23 (×2): qty 1

## 2018-03-23 MED ORDER — TRANEXAMIC ACID-NACL 1000-0.7 MG/100ML-% IV SOLN
1000.0000 mg | Freq: Once | INTRAVENOUS | Status: AC
  Administered 2018-03-23: 1000 mg via INTRAVENOUS
  Filled 2018-03-23: qty 100

## 2018-03-23 MED ORDER — BISACODYL 10 MG RE SUPP: 10.0000 mg | Freq: Every day | RECTAL | Status: DC | PRN

## 2018-03-23 MED ORDER — METHOCARBAMOL 500 MG IVPB - SIMPLE MED
INTRAVENOUS | Status: AC
  Filled 2018-03-23: qty 50

## 2018-03-23 MED ORDER — DEXAMETHASONE SODIUM PHOSPHATE 10 MG/ML IJ SOLN
10.0000 mg | Freq: Once | INTRAMUSCULAR | Status: AC
  Administered 2018-03-23: 10 mg via INTRAVENOUS

## 2018-03-23 MED ORDER — FENTANYL CITRATE (PF) 100 MCG/2ML IJ SOLN
INTRAMUSCULAR | Status: AC
  Filled 2018-03-23: qty 4

## 2018-03-23 MED ORDER — METOCLOPRAMIDE HCL 5 MG PO TABS: 5.0000 mg | ORAL_TABLET | Freq: Three times a day (TID) | ORAL | Status: DC | PRN

## 2018-03-23 MED ORDER — INSULIN ASPART 100 UNIT/ML ~~LOC~~ SOLN
0.0000 [IU] | Freq: Three times a day (TID) | SUBCUTANEOUS | Status: DC
  Administered 2018-03-24: 3 [IU] via SUBCUTANEOUS

## 2018-03-23 MED ORDER — METHOCARBAMOL 500 MG IVPB - SIMPLE MED
500.0000 mg | Freq: Four times a day (QID) | INTRAVENOUS | Status: DC | PRN
  Administered 2018-03-23: 500 mg via INTRAVENOUS
  Filled 2018-03-23: qty 50

## 2018-03-23 MED ORDER — FENTANYL CITRATE (PF) 100 MCG/2ML IJ SOLN
25.0000 ug | INTRAMUSCULAR | Status: DC | PRN
  Administered 2018-03-23: 50 ug via INTRAVENOUS

## 2018-03-23 SURGICAL SUPPLY — 47 items
ADH SKN CLS APL DERMABOND .7 (GAUZE/BANDAGES/DRESSINGS) ×1
BAG DECANTER FOR FLEXI CONT (MISCELLANEOUS) IMPLANT
BAG SPEC THK2 15X12 ZIP CLS (MISCELLANEOUS)
BAG ZIPLOCK 12X15 (MISCELLANEOUS) IMPLANT
BLADE SAG 18X100X1.27 (BLADE) ×2 IMPLANT
BLADE SURG SZ10 CARB STEEL (BLADE) ×4 IMPLANT
COVER PERINEAL POST (MISCELLANEOUS) ×2 IMPLANT
COVER SURGICAL LIGHT HANDLE (MISCELLANEOUS) ×2 IMPLANT
COVER WAND RF STERILE (DRAPES) ×1 IMPLANT
CUP ACETBLR 54 OD PINNACLE (Hips) ×1 IMPLANT
DERMABOND ADVANCED (GAUZE/BANDAGES/DRESSINGS) ×1
DERMABOND ADVANCED .7 DNX12 (GAUZE/BANDAGES/DRESSINGS) ×1 IMPLANT
DRAPE STERI IOBAN 125X83 (DRAPES) ×2 IMPLANT
DRAPE U-SHAPE 47X51 STRL (DRAPES) ×4 IMPLANT
DRESSING AQUACEL AG SP 3.5X10 (GAUZE/BANDAGES/DRESSINGS) ×1 IMPLANT
DRSG AQUACEL AG SP 3.5X10 (GAUZE/BANDAGES/DRESSINGS) ×2
DURAPREP 26ML APPLICATOR (WOUND CARE) ×2 IMPLANT
ELECT REM PT RETURN 15FT ADLT (MISCELLANEOUS) ×2 IMPLANT
ELIMINATOR HOLE APEX DEPUY (Hips) ×1 IMPLANT
GLOVE BIOGEL M STRL SZ7.5 (GLOVE) IMPLANT
GLOVE BIOGEL PI IND STRL 7.0 (GLOVE) IMPLANT
GLOVE BIOGEL PI IND STRL 7.5 (GLOVE) ×1 IMPLANT
GLOVE BIOGEL PI IND STRL 8.5 (GLOVE) ×1 IMPLANT
GLOVE BIOGEL PI INDICATOR 7.0 (GLOVE) ×2
GLOVE BIOGEL PI INDICATOR 7.5 (GLOVE) ×3
GLOVE BIOGEL PI INDICATOR 8.5 (GLOVE) ×1
GLOVE ECLIPSE 8.0 STRL XLNG CF (GLOVE) ×4 IMPLANT
GLOVE ORTHO TXT STRL SZ7.5 (GLOVE) ×2 IMPLANT
GLOVE SURG SS PI 7.5 STRL IVOR (GLOVE) ×1 IMPLANT
GOWN STRL REUS W/TWL 2XL LVL3 (GOWN DISPOSABLE) ×2 IMPLANT
GOWN STRL REUS W/TWL LRG LVL3 (GOWN DISPOSABLE) ×3 IMPLANT
HEAD CERAMIC 36 PLUS5 (Hips) ×1 IMPLANT
HOLDER FOLEY CATH W/STRAP (MISCELLANEOUS) ×2 IMPLANT
LINER NEUTRAL 54X36MM PLUS 4 (Hips) ×1 IMPLANT
PACK ANTERIOR HIP CUSTOM (KITS) ×2 IMPLANT
SCREW 6.5MMX30MM (Screw) ×1 IMPLANT
STEM FEM ACTIS HIGH SZ3 (Stem) ×1 IMPLANT
SUT MNCRL AB 4-0 PS2 18 (SUTURE) ×2 IMPLANT
SUT STRATAFIX 0 PDS 27 VIOLET (SUTURE) ×2
SUT VIC AB 1 CT1 36 (SUTURE) ×6 IMPLANT
SUT VIC AB 2-0 CT1 27 (SUTURE) ×4
SUT VIC AB 2-0 CT1 TAPERPNT 27 (SUTURE) ×2 IMPLANT
SUTURE STRATFX 0 PDS 27 VIOLET (SUTURE) ×1 IMPLANT
TRAY FOLEY CATH 14FRSI W/METER (CATHETERS) ×1 IMPLANT
TRAY FOLEY MTR SLVR 16FR STAT (SET/KITS/TRAYS/PACK) IMPLANT
WATER STERILE IRR 1000ML POUR (IV SOLUTION) ×2 IMPLANT
YANKAUER SUCT BULB TIP 10FT TU (MISCELLANEOUS) ×1 IMPLANT

## 2018-03-23 NOTE — Op Note (Signed)
NAME:  Kathleen Ramirez                ACCOUNT NO.: [id number]      MEDICAL RECORD NO.: [id number]      FACILITY:  [facility]      PHYSICIAN:  [name]  DATE OF BIRTH:  October 26, 1945     DATE OF PROCEDURE:  03/23/2018                                 OPERATIVE REPORT         PREOPERATIVE DIAGNOSIS: Left  hip osteoarthritis.      POSTOPERATIVE DIAGNOSIS:  Left hip osteoarthritis.      PROCEDURE:  Left total hip replacement through an anterior approach   utilizing DePuy THR system, component size 60mm pinnacle cup, a size 36+4 neutral   Altrex liner, a size 3 hi Actis femoral stem with a 36+5 delta ceramic   ball.      SURGEON:  Pietro Cassis. [name], M.D.      ASSISTANT:  Danae Orleans, PA-C     ANESTHESIA:  General.      SPECIMENS:  None.      COMPLICATIONS:  None.      BLOOD LOSS:  400 cc     DRAINS:  None.      INDICATION OF THE PROCEDURE:  Kathleen Ramirez is a 73 y.o. female who had   presented to office for evaluation of left hip pain.  Radiographs revealed   progressive degenerative changes with bone-on-bone   articulation of the  hip joint, including subchondral cystic changes and osteophytes.  The patient had painful limited range of   motion significantly affecting their overall quality of life and function.  The patient was failing to    respond to conservative measures including medications and/or injections and activity modification and at this point was ready   to proceed with more definitive measures.  Consent was obtained for   benefit of pain relief.  Specific risks of infection, DVT, component   failure, dislocation, neurovascular injury, and need for revision surgery were reviewed in the office as well discussion of   the anterior versus posterior approach were reviewed.     PROCEDURE IN DETAIL:  The patient was brought to operative theater.   Once adequate anesthesia, preoperative antibiotics, 2 gm of Ancef, 1 gm of Tranexamic Acid, and 10  mg of Decadron were administered, the patient was positioned supine on the [employer] table.  Once the patient was safely positioned with adequate padding of boney prominences we predraped out the hip, and used fluoroscopy to confirm orientation of the pelvis.      The left hip was then prepped and draped from proximal iliac crest to   mid thigh with a shower curtain technique.      Time-out was performed identifying the patient, planned procedure, and the appropriate extremity.     An incision was then made 2 cm lateral to the   anterior superior iliac spine extending over the orientation of the   tensor fascia lata muscle and sharp dissection was carried down to the   fascia of the muscle.      The fascia was then incised.  The muscle belly was identified and swept   laterally and retractor placed along the superior neck.  Following   cauterization of the circumflex vessels and removing some  pericapsular   fat, a second cobra retractor was placed on the inferior neck.  A T-capsulotomy was made along the line of the   superior neck to the trochanteric fossa, then extended proximally and   distally.  Tag sutures were placed and the retractors were then placed   intracapsular.  We then identified the trochanteric fossa and   orientation of my neck cut and then made a neck osteotomy with the femur on traction.  The femoral   head was removed without difficulty or complication.  Traction was let   off and retractors were placed posterior and anterior around the   acetabulum.      The labrum and foveal tissue were debrided.  I began reaming with a 46 mm   reamer and reamed up to 53 mm reamer with good bony bed preparation and a 54 mm  cup was chosen.  The final 54 mm Pinnacle cup was then impacted under fluoroscopy to confirm the depth of penetration and orientation with respect to   Abduction and forward flexion.  A screw was placed into the ilium followed by the hole eliminator.  The final    36+4 neutral Altrex liner was impacted with good visualized rim fit.  The cup was positioned anatomically within the acetabular portion of the pelvis.      At this point, the femur was rolled to 100 degrees.  Further capsule was   released off the inferior aspect of the femoral neck.  I then   released the superior capsule proximally.  With the leg in a neutral position the hook was placed laterally   along the femur under the vastus lateralis origin and elevated manually and then held in position using the hook attachment on the bed.  The leg was then extended and adducted with the leg rolled to 100   degrees of external rotation.  Retractors were placed along the medial calcar and posteriorly over the greater trochanter.  Once the proximal femur was fully   exposed, I used a box osteotome to set orientation.  I then began   broaching with the starting chili pepper broach and passed this by hand and then broached up to 3.  With the 3 broach in place I chose a high offset neck and did several trial reductions.  The offset was appropriate, leg lengths   appeared to be equal best matched with the +5 head ball trial confirmed radiographically.   Given these findings, I went ahead and dislocated the hip, repositioned all   retractors and positioned the right hip in the extended and abducted position.  The final 3 hi Actis femoral stem was   chosen and it was impacted down to the level of neck cut.  Based on this   and the trial reductions, a final 36+5 delta ceramic ball was chosen and   impacted onto a clean and dry trunnion, and the hip was reduced.  The   hip had been irrigated throughout the case again at this point.  I did   reapproximate the superior capsular leaflet to the anterior leaflet   using #1 Vicryl.  The fascia of the   tensor fascia lata muscle was then reapproximated using #1 Vicryl and #0 Stratafix sutures.  The   remaining wound was closed with 2-0 Vicryl and running 4-0 Monocryl.    The hip was cleaned, dried, and dressed sterilely using Dermabond and   Aquacel dressing.  The patient was then brought  to recovery room in stable condition tolerating the procedure well.    Danae Orleans, PA-C was present for the entirety of the case involved from   preoperative positioning, perioperative retractor management, general   facilitation of the case, as well as primary wound closure as assistant.            Pietro Cassis [name], M.D.        03/23/2018 2:18 PM

## 2018-03-23 NOTE — Discharge Instructions (Signed)

## 2018-03-23 NOTE — Transfer of Care (Signed)
Immediate Anesthesia Transfer of Care Note  Patient: Kathleen Ramirez  Procedure(s) Performed: TOTAL HIP ARTHROPLASTY ANTERIOR APPROACH (Left Hip)  Patient Location: PACU  Anesthesia Type:General  Level of Consciousness: awake, alert , oriented and patient cooperative  Airway & Oxygen Therapy: Patient Spontanous Breathing and Patient connected to face mask oxygen  Post-op Assessment: Report given to RN, Post -op Vital signs reviewed and stable and Patient moving all extremities  Post vital signs: Reviewed and stable  Last Vitals:  Vitals Value Taken Time  BP 124/65 03/23/2018  4:07 PM  Temp    Pulse 84 03/23/2018  4:12 PM  Resp 22 03/23/2018  4:12 PM  SpO2 100 % 03/23/2018  4:12 PM  Vitals shown include unvalidated device data.  Last Pain:  Vitals:   03/23/18 1303  TempSrc:   PainSc: 0-No pain         Complications: No apparent anesthesia complications

## 2018-03-23 NOTE — Anesthesia Procedure Notes (Signed)
Procedure Name: Intubation Date/Time: 03/23/2018 2:13 PM Performed by: Mitzie Na, CRNA Pre-anesthesia Checklist: Patient identified, Emergency Drugs available, Suction available, Patient being monitored and Timeout performed Patient Re-evaluated:Patient Re-evaluated prior to induction Oxygen Delivery Method: Circle system utilized Preoxygenation: Pre-oxygenation with 100% oxygen Induction Type: IV induction Ventilation: Mask ventilation without difficulty and Oral airway inserted - appropriate to patient size Laryngoscope Size: Mac and 3 Grade View: Grade I Tube type: Oral Tube size: 7.0 mm Number of attempts: 1 Airway Equipment and Method: Stylet Placement Confirmation: ETT inserted through vocal cords under direct vision,  breath sounds checked- equal and bilateral and positive ETCO2 Secured at: 25 cm Tube secured with: Tape Dental Injury: Teeth and Oropharynx as per pre-operative assessment

## 2018-03-23 NOTE — Anesthesia Postprocedure Evaluation (Signed)
Anesthesia Post Note  Patient: Kathleen Ramirez  Procedure(s) Performed: TOTAL HIP ARTHROPLASTY ANTERIOR APPROACH (Left Hip)     Patient location during evaluation: PACU Anesthesia Type: General Level of consciousness: awake Pain management: pain level controlled Vital Signs Assessment: post-procedure vital signs reviewed and stable Respiratory status: spontaneous breathing Cardiovascular status: stable Postop Assessment: no apparent nausea or vomiting Anesthetic complications: no    Last Vitals:  Vitals:   03/23/18 1615 03/23/18 1630  BP: 111/79 (!) 91/52  Pulse: 87 79  Resp: 15 13  Temp:    SpO2: 96% 96%    Last Pain:  Vitals:   03/23/18 1622  TempSrc:   PainSc: 9                  Ishaq Maffei

## 2018-03-23 NOTE — Progress Notes (Signed)
Dr Oletta Lamas at bedside. Order received to give an albuterol tx, hold pt in pacu for another hour. She will visit again in pacu before tx to room.

## 2018-03-23 NOTE — Progress Notes (Signed)
Pt sobbing. Son at bedside.

## 2018-03-23 NOTE — Anesthesia Preprocedure Evaluation (Addendum)
Anesthesia Evaluation  Patient identified by MRN, date of birth, ID band Patient awake    Reviewed: Allergy & Precautions, NPO status , Patient's Chart, lab work & pertinent test results  History of Anesthesia Complications (+) PONV  Airway Mallampati: II  TM Distance: >3 FB     Dental   Pulmonary asthma , COPD, former smoker,    breath sounds clear to auscultation       Cardiovascular  Rhythm:Regular Rate:Normal  History noted. CG   Neuro/Psych    GI/Hepatic negative GI ROS, Neg liver ROS,   Endo/Other  diabetes  Renal/GU negative Renal ROS     Musculoskeletal   Abdominal   Peds  Hematology   Anesthesia Other Findings   Reproductive/Obstetrics                            Anesthesia Physical Anesthesia Plan  ASA: III  Anesthesia Plan: General   Post-op Pain Management:    Induction: Intravenous  PONV Risk Score and Plan: 3 and Ondansetron, Dexamethasone and Midazolam  Airway Management Planned: Oral ETT  Additional Equipment:   Intra-op Plan:   Post-operative Plan: Extubation in OR  Informed Consent: I have reviewed the patients History and Physical, chart, labs and discussed the procedure including the risks, benefits and alternatives for the proposed anesthesia with the patient or authorized representative who has indicated his/her understanding and acceptance.     Dental advisory given  Plan Discussed with: CRNA and Anesthesiologist  Anesthesia Plan Comments:        Anesthesia Quick Evaluation

## 2018-03-23 NOTE — Interval H&P Note (Signed)
History and Physical Interval Note:  03/23/2018 12:40 PM  Kathleen Ramirez  has presented today for surgery, with the diagnosis of Left hip Osteoarthritis  The various methods of treatment have been discussed with the patient and family. After consideration of risks, benefits and other options for treatment, the patient has consented to  Procedure(s) with comments: [place] (Left) - 70 minutes as a surgical intervention .  The patient's history has been reviewed, patient examined, no change in status, stable for surgery.  I have reviewed the patient's chart and labs.  Questions were answered to the patient's satisfaction.     [name]

## 2018-03-23 NOTE — Progress Notes (Signed)
May give additional fentanyl 150 mcg  in pacu, and versed 0.5 mg per Dr Oletta Lamas

## 2018-03-23 NOTE — Plan of Care (Signed)
Plan of care 

## 2018-03-24 ENCOUNTER — Encounter (HOSPITAL_COMMUNITY): Payer: Self-pay | Admitting: Orthopedic Surgery

## 2018-03-24 LAB — BASIC METABOLIC PANEL
Anion gap: 11 (ref 5–15)
BUN: 14 mg/dL (ref 8–23)
CO2: 22 mmol/L (ref 22–32)
Calcium: 8 mg/dL — ABNORMAL LOW (ref 8.9–10.3)
Chloride: 104 mmol/L (ref 98–111)
Creatinine, Ser: 1.04 mg/dL — ABNORMAL HIGH (ref 0.44–1.00)
GFR calc Af Amer: >60 mL/min (ref >60)
GFR calc non Af Amer: 54 mL/min — ABNORMAL LOW (ref >60)
Glucose, Bld: 211 mg/dL — ABNORMAL HIGH (ref 70–99)
Potassium: 4.7 mmol/L (ref 3.5–5.1)
Sodium: 137 mmol/L (ref 135–145)

## 2018-03-24 LAB — CBC
HCT: 41.9 % (ref 36.0–46.0)
Hemoglobin: 13 g/dL (ref 12.0–15.0)
MCH: 28.7 pg (ref 26.0–34.0)
MCHC: 31 g/dL (ref 30.0–36.0)
MCV: 92.5 fL (ref 80.0–100.0)
Platelets: 180 10*3/uL (ref 150–400)
RBC: 4.53 MIL/uL (ref 3.87–5.11)
RDW: 12.4 % (ref 11.5–15.5)
WBC: 12.8 10*3/uL — ABNORMAL HIGH (ref 4.0–10.5)
nRBC: 0 % (ref 0.0–0.2)

## 2018-03-24 LAB — GLUCOSE, CAPILLARY: GLUCOSE-CAPILLARY: 180 mg/dL — AB (ref 70–99)

## 2018-03-24 NOTE — Progress Notes (Signed)
     Subjective: 1 Day Post-Op Procedure(s) (LRB): TOTAL HIP ARTHROPLASTY ANTERIOR APPROACH (Left)   Patient reports pain as severe, with certain motions.  Moderate pain otherwise.  Pain is worse than after the last hip replacement.  Pain is over the lateral aspect of the thigh with very tight muscles.  Discussed the procedure and post-op expectations.       Objective:   VITALS:   Vitals:   03/24/18 0144 03/24/18 0518  BP: (!) 92/58 (!) 98/59  Pulse: 66 69  Resp: 20 16  Temp: 98.1 F (36.7 C) 97.6 F (36.4 C)  SpO2: 98% 99%    Dorsiflexion/Plantar flexion intact Incision: dressing C/D/I No cellulitis present Compartment soft  Proper position of the leg Pain all over the lateral thigh, with muscle spasms.  LABS Recent Labs    03/23/18 1439 03/24/18 0543  HGB 14.6 13.0  HCT 43.0 41.9  WBC  --  12.8*  PLT  --  180    Recent Labs    03/23/18 1439 03/24/18 0543  NA 138 137  K 3.8 4.7  BUN  --  14  CREATININE  --  1.04*  GLUCOSE  --  211*     Assessment/Plan: 1 Day Post-Op Procedure(s) (LRB): TOTAL HIP ARTHROPLASTY ANTERIOR APPROACH (Left) Foley cath d/c'ed Advance diet Up with therapy D/C IV fluids Discharge home Follow up in 2 weeks at [facility] Follow up with OLIN,Buel Molder D in 2 weeks.  Contact information:  [facility]) [address] [place] [phone]    Overweight (BMI 25-29.9) Estimated body mass index is 27.7 kg/m as calculated from the following:   Height as of this encounter: 5\' 5"  (1.651 m).   Weight as of this encounter: 75.5 kg. Patient also counseled that weight may inhibit the healing process Patient counseled that losing weight will help with future health issues       [place] P[place]. Marymargaret Kirker   PAC  03/24/2018, 9:27 AM

## 2018-03-24 NOTE — Evaluation (Signed)
Physical Therapy Evaluation Patient Details Name: Kathleen Ramirez MRN: [id number] DOB: 05/10/1945 Today's Date: 03/24/2018   History of Present Illness  73 yo female s/p L THR and with hx of R DA-THA on 12/22/17. PMH includes aortic stenosis, DM, HLD, tobacco use, depression, anxiety, COPD, cervical cancer.   Clinical Impression  Pt s/p L THR and presents with decreased L LE strength/ROM and post op pain limiting functional mobility.  Pt should progress to dc home with family assist.    Follow Up Recommendations Follow surgeon's recommendation for DC plan and follow-up therapies    Equipment Recommendations  None recommended by PT    Recommendations for Other Services       Precautions / Restrictions Precautions Precautions: Fall Restrictions Weight Bearing Restrictions: No      Mobility  Bed Mobility Overal bed mobility: Needs Assistance Bed Mobility: Supine to Sit     Supine to sit: Min assist     General bed mobility comments: cues for sequence and use of R LE to self assist  Transfers Overall transfer level: Needs assistance Equipment used: Rolling walker (2 wheeled) Transfers: Sit to/from Stand Sit to Stand: Min assist Stand pivot transfers: Min assist(bed to [facility])       General transfer comment: cues for LE management and use of UEs to self assist  Ambulation/Gait Ambulation/Gait assistance: Min assist;Min guard Gait Distance (Feet): 75 Feet Assistive device: Rolling walker (2 wheeled) Gait Pattern/deviations: Step-to pattern;Step-through pattern;Decreased step length - right;Decreased step length - left;Shuffle;Trunk flexed Gait velocity: decr   General Gait Details: cues for posture, position from RW and sequence  Stairs            Wheelchair Mobility    Modified Rankin (Stroke Patients Only)       Balance Overall balance assessment: Mild deficits observed, not formally tested                                            Pertinent Vitals/Pain Pain Assessment: 0-10 Pain Score: 7  Pain Location: L hip Pain Descriptors / Indicators: Aching;Sore Pain Intervention(s): Limited activity within patient's tolerance;Monitored during session;Premedicated before session;Ice applied    Home Living Family/patient expects to be discharged to:: Private residence Living Arrangements: Alone Available Help at Discharge: Family;Available PRN/intermittently Type of Home: House Home Access: Stairs to enter Entrance Stairs-Rails: Left Entrance Stairs-Number of Steps: 5 Home Layout: One level Home Equipment: Bedside commode;Walker - 2 wheels      Prior Function Level of Independence: Independent               Hand Dominance   Dominant Hand: Right    Extremity/Trunk Assessment   Upper Extremity Assessment Upper Extremity Assessment: Overall WFL for tasks assessed    Lower Extremity Assessment Lower Extremity Assessment: LLE deficits/detail       Communication   Communication: No difficulties  Cognition Arousal/Alertness: Awake/alert Behavior During Therapy: WFL for tasks assessed/performed Overall Cognitive Status: Within Functional Limits for tasks assessed                                        General Comments      Exercises     Assessment/Plan    PT Assessment Patient needs continued PT services  PT Problem List Decreased strength;Decreased range  of motion;Decreased activity tolerance;Decreased mobility;Decreased knowledge of use of DME;Pain       PT Treatment Interventions DME instruction;Gait training;Stair training;Functional mobility training;Therapeutic activities;Therapeutic exercise;Patient/family education    PT Goals (Current goals can be found in the Care Plan section)  Acute Rehab PT Goals Patient Stated Goal: Regain IND PT Goal Formulation: With patient Time For Goal Achievement: 03/24/18 Potential to Achieve Goals: Good    Frequency 7X/week    Barriers to discharge        Co-evaluation               AM-PAC PT "6 Clicks" Mobility  Outcome Measure Help needed turning from your back to your side while in a flat bed without using bedrails?: A Little Help needed moving from lying on your back to sitting on the side of a flat bed without using bedrails?: A Little Help needed moving to and from a bed to a chair (including a wheelchair)?: A Little Help needed standing up from a chair using your arms (e.g., wheelchair or bedside chair)?: A Little Help needed to walk in hospital room?: A Little Help needed climbing 3-5 steps with a railing? : A Little 6 Click Score: 18    End of Session   Activity Tolerance: Patient limited by pain;Patient limited by fatigue Patient left: in chair;with call bell/phone within reach;with chair alarm set Nurse Communication: Mobility status PT Visit Diagnosis: Difficulty in walking, not elsewhere classified (R26.2)    Time: 5945-8592 PT Time Calculation (min) (ACUTE ONLY): 28 min   Charges:   PT Evaluation $PT Eval Low Complexity: 1 Low PT Treatments $Gait Training: 8-22 mins        [place] Pager [phone] Office [phone]   Brynlie Daza 03/24/2018, 12:45 PM

## 2018-03-24 NOTE — Progress Notes (Signed)
Physical Therapy Treatment Patient Details Name: Kathleen Ramirez MRN: [id number] DOB: 1945/03/04 Today's Date: 03/24/2018    History of Present Illness 73 yo female s/p L THR and with hx of R DA-THA on 12/22/17. PMH includes aortic stenosis, DM, HLD, tobacco use, depression, anxiety, COPD, cervical cancer.     PT Comments    Pt continues motivated but ltd by pain.  Pt able to ambulate and negotiate stairs but unable to complete therex program 2* pain.  Pt reports, PA told her ok not to do therex for a few days until pain/spasms subside.  Pt states she has still been doing THR therex for opposite hip since recent R THR and still has all therex instruction paperwork.   Follow Up Recommendations  Follow surgeon's recommendation for DC plan and follow-up therapies     Equipment Recommendations  None recommended by PT    Recommendations for Other Services       Precautions / Restrictions Precautions Precautions: Fall Restrictions Weight Bearing Restrictions: No    Mobility  Bed Mobility Overal bed mobility: Needs Assistance Bed Mobility: Supine to Sit     Supine to sit: Min assist     General bed mobility comments: cues for sequence and use of R LE to self assist  Transfers Overall transfer level: Needs assistance Equipment used: Rolling walker (2 wheeled) Transfers: Sit to/from Stand Sit to Stand: Min guard;Supervision Stand pivot transfers: Min assist(bed to [facility])       General transfer comment: cues for LE management and use of UEs to self assist  Ambulation/Gait Ambulation/Gait assistance: Min guard;Supervision Gait Distance (Feet): 75 Feet Assistive device: Rolling walker (2 wheeled) Gait Pattern/deviations: Step-to pattern;Step-through pattern;Decreased step length - right;Decreased step length - left;Shuffle;Trunk flexed Gait velocity: decr   General Gait Details: cues for posture, position from RW and sequence   Stairs Stairs: Yes Stairs assistance: Min  assist Stair Management: One rail Left;Step to pattern;Sideways Number of Stairs: 2 General stair comments: single rail with both hands; min cues for sequence and foot placement   Wheelchair Mobility    Modified Rankin (Stroke Patients Only)       Balance Overall balance assessment: Mild deficits observed, not formally tested                                          Cognition Arousal/Alertness: Awake/alert Behavior During Therapy: WFL for tasks assessed/performed Overall Cognitive Status: Within Functional Limits for tasks assessed                                        Exercises Total Joint Exercises Ankle Circles/Pumps: Left;20 reps;Supine;AROM Heel Slides: AAROM;Supine;Other (comment)(2 reps - ++ pain)    General Comments        Pertinent Vitals/Pain Pain Assessment: 0-10 Pain Score: 5  Pain Location: L hip Pain Descriptors / Indicators: Aching;Sore Pain Intervention(s): Limited activity within patient's tolerance;Monitored during session;Premedicated before session;Ice applied    Home Living Family/patient expects to be discharged to:: Private residence Living Arrangements: Alone Available Help at Discharge: Family;Available PRN/intermittently Type of Home: House Home Access: Stairs to enter Entrance Stairs-Rails: Left Home Layout: One level Home Equipment: Bedside commode;Walker - 2 wheels      Prior Function Level of Independence: Independent  PT Goals (current goals can now be found in the care plan section) Acute Rehab PT Goals Patient Stated Goal: Regain IND PT Goal Formulation: With patient Time For Goal Achievement: 03/24/18 Potential to Achieve Goals: Good Progress towards PT goals: Progressing toward goals    Frequency    7X/week      PT Plan Current plan remains appropriate    Co-evaluation              AM-PAC PT "6 Clicks" Mobility   Outcome Measure  Help needed turning from  your back to your side while in a flat bed without using bedrails?: A Little Help needed moving from lying on your back to sitting on the side of a flat bed without using bedrails?: A Little Help needed moving to and from a bed to a chair (including a wheelchair)?: A Little Help needed standing up from a chair using your arms (e.g., wheelchair or bedside chair)?: A Little Help needed to walk in hospital room?: A Little Help needed climbing 3-5 steps with a railing? : A Little 6 Click Score: 18    End of Session Equipment Utilized During Treatment: Gait belt Activity Tolerance: Patient limited by pain;Patient limited by fatigue Patient left: with call bell/phone within reach;Other (comment)(bathroom) Nurse Communication: Mobility status PT Visit Diagnosis: Difficulty in walking, not elsewhere classified (R26.2)     Time: 0981-1914 PT Time Calculation (min) (ACUTE ONLY): 15 min  Charges:  $Gait Training: 8-22 mins                     [place] Pager [phone] Office [phone]    Oddis Westling 03/24/2018, 12:54 PM

## 2018-03-24 NOTE — Plan of Care (Signed)
  Problem: Clinical Measurements: Goal: Ability to maintain clinical measurements within normal limits will improve Outcome: Progressing   Problem: Activity: Goal: Risk for activity intolerance will decrease Outcome: Progressing   Problem: Pain Managment: Goal: General experience of comfort will improve Outcome: Progressing   Problem: Safety: Goal: Ability to remain free from injury will improve Outcome: Progressing   Problem: Activity: Goal: Ability to avoid complications of mobility impairment will improve Outcome: Progressing   Problem: Pain Management: Goal: Pain level will decrease with appropriate interventions Outcome: Progressing

## 2018-03-30 NOTE — Discharge Summary (Signed)
Physician Discharge Summary  Patient ID: Kathleen Ramirez MRN: [id number] DOB/AGE: [birth year] 72 y.o.  Admit date: 03/23/2018 Discharge date: 03/24/2018   Procedures:  Procedure(s) (LRB): TOTAL HIP ARTHROPLASTY ANTERIOR APPROACH (Left)  Attending Physician:  Dr. Paralee Cancel   Admission Diagnoses:    Left hip primary OA / pain  Discharge Diagnoses:  Principal Problem:   S/P left THA, AA Active Problems:   Overweight (BMI 25.0-29.9)  Past Medical History:  Diagnosis Date  . Aortic stenosis 09/02/2009   Mild  . Asthma   . Bilateral ovarian tumors   . Breast fibroadenoma   . Cataract   . Cervical cancer ([place])   . COPD (chronic obstructive pulmonary disease) ([place])   . DDD (degenerative disc disease), cervical   . DDD (degenerative disc disease), lumbar   . Diabetes mellitus without complication ([place])   . Diverticulosis, sigmoid 12/05/2015   Noted on Colonoscopy  . Dysplastic colon polyp   . Forearm fracture 10/18/2011   Right  . Frequent urination   . Ganglion cyst of wrist, right    pt unaware  . History of colon polyps   . HLD (hyperlipidemia)   . Internal hemorrhoids 12/05/2015   Noted on Colonoscopy  . Macular degeneration   . Polycythemia   . PONV (postoperative nausea and vomiting)   . Smoking   . Tumors    5tumors removed  . UTI (lower urinary tract infection)   . Yeast vaginitis     HPI:    Kathleen Ramirez, 73 y.o. female, has a history of pain and functional disability in the left hip due to arthritis and patient has failed non-surgical conservative treatments for greater than 12 weeks to include NSAID's and/or analgesics, use of assistive devices and activity modification.  Onset of symptoms was gradual starting 2+ years ago with gradually worsening course since that time.The patient noted prior procedures of the hip to include arthroplasty on the right hip per Dr. [name] on 12/22/17.  Patient currently rates pain in the left hip at 9 out of 10 with activity.  Patient has night pain, worsening of pain with activity and weight bearing, trendelenberg gait, pain that interfers with activities of daily living and pain with passive range of motion. Patient has evidence of periarticular osteophytes and joint space narrowing by imaging studies. This condition presents safety issues increasing the risk of falls. There is no current active infection.  Risks, benefits and expectations were discussed with the patient.  Risks including but not limited to the risk of anesthesia, blood clots, nerve damage, blood vessel damage, failure of the prosthesis, infection and up to and including death.  Patient understand the risks, benefits and expectations and wishes to proceed with surgery.  PCP: Susy Frizzle, MD   Discharged Condition: good  Hospital Course:  Patient underwent the above stated procedure on 03/23/2018. Patient tolerated the procedure well and brought to the recovery room in good condition and subsequently to the floor.  POD #1 BP: 98/59 ; Pulse: 69 ; Temp: 97.6 F (36.4 C) ; Resp: 16 Patient reports pain as severe, with certain motions.  Moderate pain otherwise.  Pain is worse than after the last hip replacement.  Pain is over the lateral aspect of the thigh with very tight muscles.  Discussed the procedure and post-op expectations.   Dorsiflexion/plantar flexion intact, incision: dressing C/D/I, no cellulitis present and compartment soft. Proper position of the leg.  Pain all over the lateral thigh, with muscle spasms.  LABS  Basename    HGB     13.0  HCT     41.9    Discharge Exam: General appearance: alert, cooperative and no distress Extremities: Homans sign is negative, no sign of DVT, no edema, redness or tenderness in the calves or thighs and no ulcers, gangrene or trophic changes  Disposition:  Home with follow up in 2 weeks   Follow-up Information    Paralee Cancel, MD. Schedule an appointment as soon as possible for a visit in 2 weeks.    Specialty:  Orthopedic Surgery Contact information: [address] [place] [address] [phone]           Discharge Instructions    Call MD / Call 911   Complete by:  As directed    If you experience chest pain or shortness of breath, CALL 911 and be transported to the hospital emergency room.  If you develope a fever above 101 F, pus (white drainage) or increased drainage or redness at the wound, or calf pain, call your surgeon's office.   Change dressing   Complete by:  As directed    Maintain surgical dressing until follow up in the clinic. If the edges start to pull up, may reinforce with tape. If the dressing is no longer working, may remove and cover with gauze and tape, but must keep the area dry and clean.  Call with any questions or concerns.   Constipation Prevention   Complete by:  As directed    Drink plenty of fluids.  Prune juice may be helpful.  You may use a stool softener, such as Colace (over the counter) 100 mg twice a day.  Use MiraLax (over the counter) for constipation as needed.   Diet - low sodium heart healthy   Complete by:  As directed    Discharge instructions   Complete by:  As directed    Maintain surgical dressing until follow up in the clinic. If the edges start to pull up, may reinforce with tape. If the dressing is no longer working, may remove and cover with gauze and tape, but must keep the area dry and clean.  Follow up in 2 weeks at [facility]. Call with any questions or concerns.   Increase activity slowly as tolerated   Complete by:  As directed    Weight bearing as tolerated with assist device (walker, cane, etc) as directed, use it as long as suggested by your surgeon or therapist, typically at least 4-6 weeks.   TED hose   Complete by:  As directed    Use stockings (TED hose) for 2 weeks on both leg(s).  You may remove them at night for sleeping.      Allergies as of 03/24/2018      Reactions   Codeine  Nausea And Vomiting    makes heart race      Medication List    STOP taking these medications   azithromycin 250 MG tablet Commonly known as:  ZITHROMAX   benzonatate 100 MG capsule Commonly known as:  TESSALON   chlorpheniramine-HYDROcodone 10-8 MG/5ML Suer Commonly known as:  TUSSIONEX PENNKINETIC ER   meloxicam 7.5 MG tablet Commonly known as:  MOBIC   predniSONE 20 MG tablet Commonly known as:  DELTASONE     TAKE these medications   albuterol 108 (90 Base) MCG/ACT inhaler Commonly known as:  PROVENTIL HFA;VENTOLIN HFA Inhale 2 puffs into the lungs every 4 (four) hours as needed for wheezing or  shortness of breath. What changed:  Another medication with the same name was removed. Continue taking this medication, and follow the directions you see here.   aspirin 81 MG chewable tablet Commonly known as:  ASPIRIN CHILDRENS Chew 1 tablet (81 mg total) by mouth 2 (two) times daily for 30 days. Take for 4 weeks, then resume regular dose.   atorvastatin 20 MG tablet Commonly known as:  LIPITOR TAKE 1 TABLET(20 MG) BY MOUTH DAILY What changed:  See the new instructions.   CINNAMON PO Take 1 capsule by mouth daily.   docusate sodium 100 MG capsule Commonly known as:  COLACE Take 1 capsule (100 mg total) by mouth 2 (two) times daily.   ferrous sulfate 325 (65 FE) MG tablet Commonly known as:  FERROUSUL Take 1 tablet (325 mg total) by mouth 3 (three) times daily with meals.   HYDROcodone-acetaminophen 7.5-325 MG tablet Commonly known as:  NORCO Take 1-2 tablets by mouth every 4 (four) hours as needed for moderate pain.   JANUVIA 100 MG tablet Generic drug:  sitaGLIPtin TAKE 1 TABLET(100 MG) BY MOUTH DAILY What changed:  See the new instructions.   methocarbamol 500 MG tablet Commonly known as:  ROBAXIN Take 1 tablet (500 mg total) by mouth every 6 (six) hours as needed for muscle spasms.   Nebulizer/Tubing/Mouthpiece Kit Disp one nebulizer machine, tubing set  and mouthpiece kit   ondansetron 4 MG tablet Commonly known as:  ZOFRAN Take 1 tablet (4 mg total) by mouth every 8 (eight) hours as needed for nausea or vomiting.   pioglitazone 30 MG tablet Commonly known as:  ACTOS Take 1 tablet (30 mg total) by mouth daily.   polyethylene glycol packet Commonly known as:  MIRALAX / GLYCOLAX Take 17 g by mouth 2 (two) times daily.   PROBIOTIC ACIDOPHILUS PO Take 1 capsule by mouth 2 (two) times a week.   SYMBICORT 160-4.5 MCG/ACT inhaler Generic drug:  budesonide-formoterol INHALE 2 PUFFS INTO THE LUNGS TWICE DAILY What changed:    when to take this  reasons to take this            Discharge Care Instructions  (From admission, onward)         Start     Ordered   03/24/18 0000  Change dressing    Comments:  Maintain surgical dressing until follow up in the clinic. If the edges start to pull up, may reinforce with tape. If the dressing is no longer working, may remove and cover with gauze and tape, but must keep the area dry and clean.  Call with any questions or concerns.   03/24/18 0933           Signed: West Pugh. Ladawn Boullion   PA-C  03/30/2018, 8:10 AM

## 2018-05-06 ENCOUNTER — Other Ambulatory Visit: Payer: Self-pay | Admitting: Family Medicine

## 2018-05-07 DIAGNOSIS — Z471 Aftercare following joint replacement surgery: Secondary | ICD-10-CM | POA: Diagnosis not present

## 2018-05-07 DIAGNOSIS — Z96642 Presence of left artificial hip joint: Secondary | ICD-10-CM | POA: Diagnosis not present

## 2018-05-20 DIAGNOSIS — J441 Chronic obstructive pulmonary disease with (acute) exacerbation: Secondary | ICD-10-CM | POA: Diagnosis not present

## 2018-05-20 DIAGNOSIS — M1611 Unilateral primary osteoarthritis, right hip: Secondary | ICD-10-CM | POA: Diagnosis not present

## 2018-06-19 ENCOUNTER — Other Ambulatory Visit: Payer: Self-pay | Admitting: Family Medicine

## 2018-06-19 DIAGNOSIS — J441 Chronic obstructive pulmonary disease with (acute) exacerbation: Secondary | ICD-10-CM | POA: Diagnosis not present

## 2018-06-19 DIAGNOSIS — M1611 Unilateral primary osteoarthritis, right hip: Secondary | ICD-10-CM | POA: Diagnosis not present

## 2018-07-20 DIAGNOSIS — M1611 Unilateral primary osteoarthritis, right hip: Secondary | ICD-10-CM | POA: Diagnosis not present

## 2018-07-20 DIAGNOSIS — J441 Chronic obstructive pulmonary disease with (acute) exacerbation: Secondary | ICD-10-CM | POA: Diagnosis not present

## 2018-08-19 DIAGNOSIS — M1611 Unilateral primary osteoarthritis, right hip: Secondary | ICD-10-CM | POA: Diagnosis not present

## 2018-08-19 DIAGNOSIS — J441 Chronic obstructive pulmonary disease with (acute) exacerbation: Secondary | ICD-10-CM | POA: Diagnosis not present

## 2018-08-28 ENCOUNTER — Other Ambulatory Visit: Payer: Self-pay | Admitting: Family Medicine

## 2018-09-19 DIAGNOSIS — J441 Chronic obstructive pulmonary disease with (acute) exacerbation: Secondary | ICD-10-CM | POA: Diagnosis not present

## 2018-09-19 DIAGNOSIS — M1611 Unilateral primary osteoarthritis, right hip: Secondary | ICD-10-CM | POA: Diagnosis not present

## 2018-10-20 DIAGNOSIS — M1611 Unilateral primary osteoarthritis, right hip: Secondary | ICD-10-CM | POA: Diagnosis not present

## 2018-10-20 DIAGNOSIS — J441 Chronic obstructive pulmonary disease with (acute) exacerbation: Secondary | ICD-10-CM | POA: Diagnosis not present

## 2018-11-19 DIAGNOSIS — M1611 Unilateral primary osteoarthritis, right hip: Secondary | ICD-10-CM | POA: Diagnosis not present

## 2018-11-19 DIAGNOSIS — J441 Chronic obstructive pulmonary disease with (acute) exacerbation: Secondary | ICD-10-CM | POA: Diagnosis not present

## 2018-12-20 DIAGNOSIS — M1611 Unilateral primary osteoarthritis, right hip: Secondary | ICD-10-CM | POA: Diagnosis not present

## 2018-12-20 DIAGNOSIS — J441 Chronic obstructive pulmonary disease with (acute) exacerbation: Secondary | ICD-10-CM | POA: Diagnosis not present

## 2019-01-18 ENCOUNTER — Other Ambulatory Visit: Payer: Self-pay

## 2019-01-18 MED ORDER — ATORVASTATIN CALCIUM 20 MG PO TABS: 20.0000 mg | ORAL_TABLET | Freq: Every evening | ORAL | 0 refills | Status: DC

## 2019-01-19 DIAGNOSIS — J441 Chronic obstructive pulmonary disease with (acute) exacerbation: Secondary | ICD-10-CM | POA: Diagnosis not present

## 2019-01-19 DIAGNOSIS — M1611 Unilateral primary osteoarthritis, right hip: Secondary | ICD-10-CM | POA: Diagnosis not present

## 2019-01-26 ENCOUNTER — Encounter: Payer: Self-pay | Admitting: Family Medicine

## 2019-01-26 ENCOUNTER — Other Ambulatory Visit: Payer: Self-pay

## 2019-01-26 ENCOUNTER — Ambulatory Visit (INDEPENDENT_AMBULATORY_CARE_PROVIDER_SITE_OTHER): Payer: Medicare Other | Admitting: Family Medicine

## 2019-01-26 VITALS — BP 130/76 | HR 78 | Temp 96.9°F | Resp 16 | Ht 65.0 in | Wt 170.0 lb

## 2019-01-26 DIAGNOSIS — M79675 Pain in left toe(s): Secondary | ICD-10-CM

## 2019-01-26 DIAGNOSIS — E1165 Type 2 diabetes mellitus with hyperglycemia: Secondary | ICD-10-CM

## 2019-01-26 DIAGNOSIS — L84 Corns and callosities: Secondary | ICD-10-CM | POA: Diagnosis not present

## 2019-01-26 NOTE — Progress Notes (Signed)
Subjective:    Patient ID: Kathleen Ramirez, female    DOB: Jul 10, 1945, 73 y.o.   MRN: [id number]  Patient presents for Check little toe on left foot   Pt here with left  4th toe pain for the past 5 weeks.   she used a bal, she used probiotics epson salt, neosporin and it has helped . She has not noticed any moisture or drainage  She has been using corn pads for the past 2 weeks for her family member told her that she has a sore on her foot.  Usually has swelling of the toe and redness by the end of the day.  No swelling in ankles or feet  Is concerned it may be gout even if the sheets touch her toes she has pain.     COPD- uses symbicort  As needed only, has not required albuterol    DM- last A1C 7.2%, she vinegar takes/ probiotic, cinnamon and her Januvia.  She does not check her blood sugars  Review Of Systems:  GEN- denies fatigue, fever, weight loss,weakness, recent illness HEENT- denies eye drainage, change in vision, nasal discharge, CVS- denies chest pain, palpitations RESP- denies SOB, cough, wheeze ABD- denies N/V, change in stools, abd pain GU- denies dysuria, hematuria, dribbling, incontinence MSK- denies joint pain, muscle aches, injury Neuro- denies headache, dizziness, syncope, seizure activity       Objective:    BP 130/76   Pulse 78   Temp (!) 96.9 F (36.1 C) (Oral)   Resp 16   Ht 5\' 5"  (1.651 m)   Wt 170 lb (77.1 kg)   SpO2 96%   BMI 28.29 kg/m  GEN- NAD, alert and oriented x3 CVS- RRR, no murmur RESP-CTAB EXT- No edema And left foot preulcerative callus with mild blood trapped beneath the skin on the inner fifth digit which is rubbing against the fourth.  She also has a corn on top of the fifth digit tender to palpation.  No significant erythema no breakdown of the skin tender to palpation on both of the spots no fluctuant area no blisters Monofilament intact Pulses- Radial, DP- 2+        Assessment & Plan:      Problem List Items  Addressed This Visit      Unprioritized   Diabetes ([place]) - Primary    Has diabetes mellitus which is fairly controlled but she does not check her blood sugars.  She has preulcerative callus between the toes as well as a corn on top.  I think these are causing the pain.  I do not see a sign of infection.  She also has thick toenails.  She states that she is unable to get to her feet herself.  I recommend a podiatry visit but she declines and states that her granddaughter will assist her when she comes in town.  In the meantime I will have her use moleskin between the toes and she can use a corn and callus pad on the top of the toe.  She finally agreed to let me check her labs today I will check her 123456 metabolic panel she was concerned with gout so we will check uric acid      Relevant Orders   CBC with Differential   Comprehensive metabolic panel   Hemoglobin A1c   HM Diabetes Foot Exam (Completed)    Other Visit Diagnoses    Pain of toe of left foot       Relevant  Orders   Uric Acid   Corns/callosities       Pre-ulcerative calluses          Note: This dictation was prepared with Dragon dictation along with smaller phrase technology. Any transcriptional errors that result from this process are unintentional.

## 2019-01-26 NOTE — Patient Instructions (Addendum)
Use mole skin between the toes Use the corn callus pad If not improving, recommend you see foot doctor We will call with lab results  F/U Dr. Dennard Schaumann in 4 months

## 2019-01-26 NOTE — Assessment & Plan Note (Signed)
Has diabetes mellitus which is fairly controlled but she does not check her blood sugars.  She has preulcerative callus between the toes as well as a corn on top.  I think these are causing the pain.  I do not see a sign of infection.  She also has thick toenails.  She states that she is unable to get to her feet herself.  I recommend a podiatry visit but she declines and states that her granddaughter will assist her when she comes in town.  In the meantime I will have her use moleskin between the toes and she can use a corn and callus pad on the top of the toe.  She finally agreed to let me check her labs today I will check her 123456 metabolic panel she was concerned with gout so we will check uric acid

## 2019-01-27 LAB — COMPREHENSIVE METABOLIC PANEL
AG Ratio: 2.1 (calc) (ref 1.0–2.5)
ALT: 12 U/L (ref 6–29)
AST: 14 U/L (ref 10–35)
Albumin: 4.1 g/dL (ref 3.6–5.1)
Alkaline phosphatase (APISO): 47 U/L (ref 37–153)
BUN/Creatinine Ratio: 14 (calc) (ref 6–22)
BUN: 18 mg/dL (ref 7–25)
CO2: 22 mmol/L (ref 20–32)
Calcium: 9.2 mg/dL (ref 8.6–10.4)
Chloride: 105 mmol/L (ref 98–110)
Creat: 1.3 mg/dL — ABNORMAL HIGH (ref 0.60–0.93)
Globulin: 2 g/dL (calc) (ref 1.9–3.7)
Glucose, Bld: 226 mg/dL — ABNORMAL HIGH (ref 65–99)
Potassium: 4.3 mmol/L (ref 3.5–5.3)
Sodium: 139 mmol/L (ref 135–146)
Total Bilirubin: 0.5 mg/dL (ref 0.2–1.2)
Total Protein: 6.1 g/dL (ref 6.1–8.1)

## 2019-01-27 LAB — HEMOGLOBIN A1C
Hgb A1c MFr Bld: 7.3 % of total Hgb — ABNORMAL HIGH (ref <5.7)
Mean Plasma Glucose: 163 (calc)
eAG (mmol/L): 9 (calc)

## 2019-01-27 LAB — CBC WITH DIFFERENTIAL/PLATELET
Absolute Monocytes: 550 cells/uL (ref 200–950)
Basophils Absolute: 120 cells/uL (ref 0–200)
Basophils Relative: 1.4 %
Eosinophils Absolute: 284 cells/uL (ref 15–500)
Eosinophils Relative: 3.3 %
HCT: 45.6 % — ABNORMAL HIGH (ref 35.0–45.0)
Hemoglobin: 15.4 g/dL (ref 11.7–15.5)
Lymphs Abs: 2580 cells/uL (ref 850–3900)
MCH: 29.3 pg (ref 27.0–33.0)
MCHC: 33.8 g/dL (ref 32.0–36.0)
MCV: 86.7 fL (ref 80.0–100.0)
MPV: 11.3 fL (ref 7.5–12.5)
Monocytes Relative: 6.4 %
Neutro Abs: 5065 cells/uL (ref 1500–7800)
Neutrophils Relative %: 58.9 %
Platelets: 202 10*3/uL (ref 140–400)
RBC: 5.26 10*6/uL — ABNORMAL HIGH (ref 3.80–5.10)
RDW: 12.3 % (ref 11.0–15.0)
Total Lymphocyte: 30 %
WBC: 8.6 10*3/uL (ref 3.8–10.8)

## 2019-01-27 LAB — URIC ACID: Uric Acid, Serum: 5.2 mg/dL (ref 2.5–7.0)

## 2019-02-02 ENCOUNTER — Other Ambulatory Visit: Payer: Self-pay | Admitting: Family Medicine

## 2019-02-02 MED ORDER — METFORMIN HCL 500 MG PO TABS: 500.0000 mg | ORAL_TABLET | Freq: Two times a day (BID) | ORAL | 1 refills | Status: DC

## 2019-04-25 ENCOUNTER — Other Ambulatory Visit: Payer: Self-pay | Admitting: Family Medicine

## 2019-08-06 ENCOUNTER — Other Ambulatory Visit: Payer: Self-pay | Admitting: Family Medicine

## 2019-08-11 ENCOUNTER — Other Ambulatory Visit: Payer: Self-pay

## 2019-08-11 ENCOUNTER — Other Ambulatory Visit: Payer: Self-pay | Admitting: Family Medicine

## 2019-08-12 ENCOUNTER — Other Ambulatory Visit: Payer: Self-pay

## 2019-08-31 ENCOUNTER — Other Ambulatory Visit: Payer: Self-pay

## 2019-08-31 ENCOUNTER — Other Ambulatory Visit: Payer: Self-pay | Admitting: Family Medicine

## 2019-08-31 MED ORDER — ATORVASTATIN CALCIUM 20 MG PO TABS: ORAL_TABLET | ORAL | 0 refills | Status: DC

## 2019-08-31 MED ORDER — SITAGLIPTIN PHOSPHATE 100 MG PO TABS: ORAL_TABLET | ORAL | 0 refills | Status: DC

## 2019-08-31 MED ORDER — BUDESONIDE-FORMOTEROL FUMARATE 160-4.5 MCG/ACT IN AERO: 2.0000 | INHALATION_SPRAY | Freq: Two times a day (BID) | RESPIRATORY_TRACT | 5 refills | Status: DC

## 2019-08-31 MED ORDER — METFORMIN HCL 500 MG PO TABS: 500.0000 mg | ORAL_TABLET | Freq: Two times a day (BID) | ORAL | 1 refills | Status: DC

## 2019-11-29 ENCOUNTER — Other Ambulatory Visit: Payer: Self-pay | Admitting: Family Medicine

## 2019-11-29 DIAGNOSIS — E1165 Type 2 diabetes mellitus with hyperglycemia: Secondary | ICD-10-CM

## 2019-11-29 NOTE — Telephone Encounter (Signed)
Received fax from [facility] on [address]. [name] a refill on atorvastatin (LIPITOR) 20 MG tablet  Requesting a 90 day supply Sig: TAKE 1 TABLET(20 MG) BY MOUTH EVERY EVENING

## 2019-11-29 NOTE — Telephone Encounter (Signed)
Received fax requesting refill on  sitaGLIPtin (JANUVIA) 100 MG tablet Sig: TAKE 1 TABLET(100 MG) BY MOUTH DAILY

## 2019-11-30 MED ORDER — SITAGLIPTIN PHOSPHATE 100 MG PO TABS: ORAL_TABLET | ORAL | 0 refills | Status: DC

## 2019-12-01 ENCOUNTER — Other Ambulatory Visit: Payer: Self-pay | Admitting: *Deleted

## 2019-12-01 ENCOUNTER — Other Ambulatory Visit: Payer: Self-pay | Admitting: Family Medicine

## 2019-12-01 DIAGNOSIS — E1165 Type 2 diabetes mellitus with hyperglycemia: Secondary | ICD-10-CM

## 2019-12-01 MED ORDER — SITAGLIPTIN PHOSPHATE 100 MG PO TABS: ORAL_TABLET | ORAL | 0 refills | Status: DC

## 2019-12-05 ENCOUNTER — Other Ambulatory Visit: Payer: Self-pay

## 2019-12-05 ENCOUNTER — Other Ambulatory Visit: Payer: Self-pay | Admitting: Family Medicine

## 2019-12-05 MED ORDER — ATORVASTATIN CALCIUM 20 MG PO TABS: 20.0000 mg | ORAL_TABLET | Freq: Every day | ORAL | 0 refills | Status: DC

## 2019-12-05 NOTE — Telephone Encounter (Signed)
Medication filled x1 with no refills.   Requires office visit before any further refills can be given.  

## 2019-12-05 NOTE — Telephone Encounter (Signed)
Pt is needing refill on atorvastatin (LIPITOR) 20 MG tablet sent to [id number] - [place], [address] AT [facility]   Pt call back [phone]

## 2020-01-07 ENCOUNTER — Other Ambulatory Visit: Payer: Self-pay | Admitting: Family Medicine

## 2020-01-07 DIAGNOSIS — E1165 Type 2 diabetes mellitus with hyperglycemia: Secondary | ICD-10-CM

## 2020-01-28 ENCOUNTER — Ambulatory Visit (INDEPENDENT_AMBULATORY_CARE_PROVIDER_SITE_OTHER): Payer: Medicare Other

## 2020-01-28 ENCOUNTER — Encounter (HOSPITAL_COMMUNITY): Payer: Self-pay | Admitting: Emergency Medicine

## 2020-01-28 ENCOUNTER — Other Ambulatory Visit: Payer: Self-pay

## 2020-01-28 ENCOUNTER — Ambulatory Visit (HOSPITAL_COMMUNITY)
Admission: EM | Admit: 2020-01-28 | Discharge: 2020-01-28 | Disposition: A | Discharge status: Home / Self Care | Payer: Medicare Other | Attending: Physician Assistant | Admitting: Physician Assistant

## 2020-01-28 DIAGNOSIS — Z8709 Personal history of other diseases of the respiratory system: Secondary | ICD-10-CM | POA: Insufficient documentation

## 2020-01-28 DIAGNOSIS — U071 COVID-19: Secondary | ICD-10-CM | POA: Diagnosis not present

## 2020-01-28 DIAGNOSIS — R059 Cough, unspecified: Secondary | ICD-10-CM

## 2020-01-28 DIAGNOSIS — J441 Chronic obstructive pulmonary disease with (acute) exacerbation: Secondary | ICD-10-CM | POA: Diagnosis not present

## 2020-01-28 DIAGNOSIS — J439 Emphysema, unspecified: Secondary | ICD-10-CM | POA: Diagnosis not present

## 2020-01-28 MED ORDER — ALBUTEROL SULFATE HFA 108 (90 BASE) MCG/ACT IN AERS: 2.0000 | INHALATION_SPRAY | RESPIRATORY_TRACT | 0 refills | Status: DC | PRN

## 2020-01-28 MED ORDER — PREDNISONE 50 MG PO TABS: 50.0000 mg | ORAL_TABLET | Freq: Every day | ORAL | 0 refills | Status: DC

## 2020-01-28 MED ORDER — DOXYCYCLINE HYCLATE 100 MG PO CAPS: 100.0000 mg | ORAL_CAPSULE | Freq: Two times a day (BID) | ORAL | 0 refills | Status: DC

## 2020-01-28 NOTE — Discharge Instructions (Signed)
Chest xray negative for pneumonia. COVID testing ordered, please stay home until testing results return. Start prednisone and doxycycline for bronchitis. Albuterol and symbicort as directed. Recheck with your PCP Monday. If worsening symptoms with shortness of breath, go to the emergency department for further evaluation.

## 2020-01-28 NOTE — ED Triage Notes (Signed)
Pt states that she has a cough, body aches, and a headache. Pt states that her sx started Sunday. Pt states that specifically her pain starts in her lower back down to the back of her knee.

## 2020-01-28 NOTE — ED Provider Notes (Signed)
[facility]    CSN: [id number] Arrival date & time: 01/28/20  1105      History   Chief Complaint Chief Complaint  Patient presents with   Cough    HPI Kathleen Ramirez is a 74 y.o. female.   74 year old female with history of COPD/asthma, DM, comes in for 6 day of URI symptoms. Cough that can be productive at times. Nasal congestion. Denies sore throat, rhinorrhea. Denies shortness of breath, fever. Now with pain around the back/ribs when coughing. Restarted on symbicort when symptoms started. Does not have access to albuterol.      Past Medical History:  Diagnosis Date   Aortic stenosis 09/02/2009   Mild   Asthma    Bilateral ovarian tumors    Breast fibroadenoma    Cataract    Cervical cancer ([place])    COPD (chronic obstructive pulmonary disease) ([facility])    DDD (degenerative disc disease), cervical    DDD (degenerative disc disease), lumbar    Diabetes mellitus without complication ([place])    Diverticulosis, sigmoid 12/05/2015   Noted on Colonoscopy   Dysplastic colon polyp    Forearm fracture 10/18/2011   Right   Frequent urination    Ganglion cyst of wrist, right    pt unaware   History of colon polyps    HLD (hyperlipidemia)    Internal hemorrhoids 12/05/2015   Noted on Colonoscopy   Macular degeneration    Polycythemia    PONV (postoperative nausea and vomiting)    Smoking    Tumors    5tumors removed   UTI (lower urinary tract infection)    Yeast vaginitis     Patient Active Problem List   Diagnosis Date Noted   S/P left THA, 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   Overweight (BMI 25.0-29.9) 12/23/2017   S/P right THA, AA 12/22/2017   AORTIC STENOSIS, MILD 09/02/2009   FIBROADENOMA, BREAST 08/25/2009   OSTEOARTHRITIS, HANDS, 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   VAGINITIS, CANDIDAL 02/20/2009   URI 02/20/2009   GANGLION CYST, WRIST, RIGHT 10/20/2008   Diabetes ([place]) 09/08/2008   HYPERLIPIDEMIA, MIXED 08/23/2008   POLYCYTHEMIA,  SECONDARY 08/23/2008   TOBACCO ABUSE 08/11/2008   TROCHANTERIC BURSITIS, RIGHT 08/11/2008   COLONIC POLYPS, ADENOMATOUS, HX OF 08/11/2008   TINEA CORPORIS 01/28/2008   DEPRESSION/ANXIETY 01/28/2008   MACULAR DEGENERATION 01/28/2008   Extrinsic asthma 01/28/2008   COPD (chronic obstructive pulmonary disease) ([place]) 01/28/2008   FREQUENCY, URINARY 01/28/2008   SHOULDER PAIN, LEFT 05/05/2003   CHEST PAIN 11/18/2002   DIVERTICULOSIS OF COLON 12/10/2001    Past Surgical History:  Procedure Laterality Date   ABDOMINAL HYSTERECTOMY     APPENDECTOMY     BLADDER NECK SUSPENSION     BLADDER SURGERY     BREAST LUMPECTOMY     CATARACT EXTRACTION, BILATERAL     COLONOSCOPY W/ POLYPECTOMY  12/05/2015   oophrectomy     ORIF ULNAR FRACTURE  10/19/2011   Procedure: OPEN REDUCTION INTERNAL FIXATION (ORIF) ULNAR FRACTURE;  Surgeon: Linna Hoff, MD;  Location: [facility];  Service: Orthopedics;  Laterality: Right;   TONSILLECTOMY     TOTAL HIP ARTHROPLASTY Right 12/22/2017   Procedure: RIGHT TOTAL HIP ARTHROPLASTY ANTERIOR APPROACH;  Surgeon: Paralee Cancel, MD;  Location: [facility];  Service: Orthopedics;  Laterality: Right;  70   TOTAL HIP ARTHROPLASTY Left 03/23/2018   Procedure: TOTAL HIP ARTHROPLASTY ANTERIOR APPROACH;  Surgeon: Paralee Cancel, MD;  Location: [facility];  Service: Orthopedics;  Laterality: Left;  70 minutes  TUBAL LIGATION     TUMOR EXCISION     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      Home Medications    Prior to Admission medications   Medication Sig Start Date End Date Taking? Authorizing Provider  atorvastatin (LIPITOR) 20 MG tablet TAKE 1 TABLET(20 MG) BY MOUTH DAILY 01/09/20  Yes Susy Frizzle, MD  budesonide-formoterol [facility]) 160-4.5 MCG/ACT inhaler Inhale 2 puffs into the lungs 2 (two) times daily. 08/31/19  Yes Susy Frizzle, MD  CINNAMON PO Take 1 capsule by mouth daily.   Yes  [provider]  metFORMIN (GLUCOPHAGE) 500 MG tablet Take 1 tablet (500 mg total) by mouth 2 (two) times daily with a meal. 08/31/19  Yes Pickard, Cammie Mcgee, MD  sitaGLIPtin (JANUVIA) 100 MG tablet TAKE 1 TABLET(100 MG) BY MOUTH DAILY 01/09/20  Yes Quincy, Modena Nunnery, MD  albuterol (VENTOLIN HFA) 108 (90 Base) MCG/ACT inhaler Inhale 2 puffs into the lungs every 4 (four) hours as needed for wheezing or shortness of breath. 01/28/20   Tasia Catchings, Kolina Kube V, PA-C  doxycycline (VIBRAMYCIN) 100 MG capsule Take 1 capsule (100 mg total) by mouth 2 (two) times daily. 01/28/20   Tasia Catchings, Nga Rabon V, PA-C  predniSONE (DELTASONE) 50 MG tablet Take 1 tablet (50 mg total) by mouth daily with breakfast. 01/28/20   Ok Edwards, PA-C  Respiratory Therapy Supplies (NEBULIZER/TUBING/MOUTHPIECE) KIT Disp one nebulizer machine, tubing set and mouthpiece kit 02/19/18   Delsa Grana, PA-C    Family History Family History  Problem Relation Age of Onset   Heart disease Mother     Social History Social History   Tobacco Use   Smoking status: Former Smoker    Packs/day: 0.00    Types: Cigarettes    Quit date: 11/13/2017    Years since quitting: 2.2   Smokeless tobacco: Never Used  Vaping Use   Vaping Use: Never used  Substance Use Topics   Alcohol use: Yes    Comment: rare   Drug use: No     Allergies   Codeine   Review of Systems Review of Systems  Reason unable to perform ROS: See HPI as above.     Physical Exam Triage Vital Signs ED Triage Vitals  Enc Vitals Group     BP 01/28/20 1300 115/68     Pulse Rate 01/28/20 1300 99     Resp --      Temp 01/28/20 1300 98.2 F (36.8 C)     Temp Source 01/28/20 1300 Oral     SpO2 01/28/20 1300 91 %     Weight --      Height --      Head Circumference --      Peak Flow --      Pain Score 01/28/20 1256 5     Pain Loc --      Pain Edu? --      Excl. in [place]? --    No data found.  Updated Vital Signs BP 113/80 (BP Location: Right Arm)    Pulse 98    Temp  98.7 F (37.1 C) (Oral)    Resp (!) 22    SpO2 93% Comment: with talking, 96%, rechecked to verifiy  Physical Exam Constitutional:      General: She is not in acute distress.    Appearance: Normal appearance. She is not ill-appearing, toxic-appearing or diaphoretic.  HENT:  Head: Normocephalic and atraumatic.     Mouth/Throat:     Mouth: Mucous membranes are moist.     Pharynx: Oropharynx is clear. Uvula midline.  Cardiovascular:     Rate and Rhythm: Normal rate and regular rhythm.     Heart sounds: Normal heart sounds. No murmur heard. No friction rub. No gallop.   Pulmonary:     Effort: Pulmonary effort is normal. No accessory muscle usage, prolonged expiration, respiratory distress or retractions.     Comments: Distant lung sounds. Intermittent rhonchi to the lower bases. No wheezing.  Musculoskeletal:     Cervical back: Normal range of motion and neck supple.  Skin:    General: Skin is warm and dry.  Neurological:     General: No focal deficit present.     Mental Status: She is alert and oriented to person, place, and time.      UC Treatments / Results  Labs (all labs ordered are listed, but only abnormal results are displayed) Labs Reviewed  SARS CORONAVIRUS 2 (TAT 6-24 HRS)    EKG   Radiology DG Chest 2 View  Result Date: 01/28/2020 CLINICAL DATA:  74 year old female with history of productive cough for 1 week. Smoker. EXAM: CHEST - 2 VIEW COMPARISON:  Chest x-ray 06/16/2016. FINDINGS: Lung volumes are increased with mild emphysematous changes. No consolidative airspace disease. No pleural effusions. No pneumothorax. No pulmonary nodule or mass noted. Pulmonary vasculature and the cardiomediastinal silhouette are within normal limits. Atherosclerosis in the thoracic aorta. IMPRESSION: 1. No radiographic evidence of acute cardiopulmonary disease. 2. Emphysema. 3. Aortic atherosclerosis. Electronically Signed   By: Vinnie Langton M.D.   On: 01/28/2020 13:48     Procedures Procedures (including critical care time)  Medications Ordered in UC Medications - No data to display  Initial Impression / Assessment and Plan / UC Course  I have reviewed the triage vital signs and the nursing notes.  Pertinent labs & imaging results that were available during my care of the patient were reviewed by me and considered in my medical decision making (see chart for details).    CXR negative for pneumonia. O2 sat ranging 91-93%. Patient speaking in full sentences without difficulty. COVID testing ordered. Will treat for COPD exacerbation with prednisone and doxycycline. Albuterol sent to pharmacy. Discussed close PCP follow up. Strict return precautions given.  Final Clinical Impressions(s) / UC Diagnoses   Final diagnoses:  COPD exacerbation [facility])   ED Prescriptions    Medication Sig Dispense Auth. Provider   albuterol (VENTOLIN HFA) 108 (90 Base) MCG/ACT inhaler Inhale 2 puffs into the lungs every 4 (four) hours as needed for wheezing or shortness of breath. 1 each Natanael Saladin V, PA-C   predniSONE (DELTASONE) 50 MG tablet Take 1 tablet (50 mg total) by mouth daily with breakfast. 5 tablet Monaye Blackie V, PA-C   doxycycline (VIBRAMYCIN) 100 MG capsule Take 1 capsule (100 mg total) by mouth 2 (two) times daily. 14 capsule Ok Edwards, PA-C     PDMP not reviewed this encounter.   Ok Edwards, PA-C 01/28/20 1412

## 2020-01-29 LAB — SARS CORONAVIRUS 2 (TAT 6-24 HRS): SARS Coronavirus 2: POSITIVE — AB

## 2020-01-30 ENCOUNTER — Other Ambulatory Visit: Payer: Self-pay

## 2020-01-30 ENCOUNTER — Emergency Department (HOSPITAL_COMMUNITY)
Admission: EM | Admit: 2020-01-30 | Discharge: 2020-01-31 | Disposition: A | Discharge status: Home / Self Care | Payer: Medicare Other | Attending: Emergency Medicine | Admitting: Emergency Medicine

## 2020-01-30 ENCOUNTER — Telehealth (HOSPITAL_COMMUNITY): Payer: Self-pay | Admitting: *Deleted

## 2020-01-30 DIAGNOSIS — Z5321 Procedure and treatment not carried out due to patient leaving prior to being seen by health care provider: Secondary | ICD-10-CM | POA: Insufficient documentation

## 2020-01-30 DIAGNOSIS — U071 COVID-19: Secondary | ICD-10-CM | POA: Diagnosis not present

## 2020-01-30 LAB — COMPREHENSIVE METABOLIC PANEL
ALT: 43 U/L (ref 0–44)
AST: 43 U/L — ABNORMAL HIGH (ref 15–41)
Albumin: 3.3 g/dL — ABNORMAL LOW (ref 3.5–5.0)
Alkaline Phosphatase: 39 U/L (ref 38–126)
Anion gap: 12 (ref 5–15)
BUN: 16 mg/dL (ref 8–23)
CO2: 23 mmol/L (ref 22–32)
Calcium: 8.2 mg/dL — ABNORMAL LOW (ref 8.9–10.3)
Chloride: 98 mmol/L (ref 98–111)
Creatinine, Ser: 1.03 mg/dL — ABNORMAL HIGH (ref 0.44–1.00)
GFR, Estimated: 57 mL/min — ABNORMAL LOW (ref >60)
Glucose, Bld: 117 mg/dL — ABNORMAL HIGH (ref 70–99)
Potassium: 3.7 mmol/L (ref 3.5–5.1)
Sodium: 133 mmol/L — ABNORMAL LOW (ref 135–145)
Total Bilirubin: 0.8 mg/dL (ref 0.3–1.2)
Total Protein: 6.2 g/dL — ABNORMAL LOW (ref 6.5–8.1)

## 2020-01-30 LAB — URINALYSIS, ROUTINE W REFLEX MICROSCOPIC
Bilirubin Urine: NEGATIVE
Glucose, UA: NEGATIVE mg/dL
Ketones, ur: NEGATIVE mg/dL
Leukocytes,Ua: NEGATIVE
Nitrite: NEGATIVE
Protein, ur: NEGATIVE mg/dL
Specific Gravity, Urine: 1.009 (ref 1.005–1.030)
pH: 5 (ref 5.0–8.0)

## 2020-01-30 LAB — CBC
HCT: 48.1 % — ABNORMAL HIGH (ref 36.0–46.0)
Hemoglobin: 15.4 g/dL — ABNORMAL HIGH (ref 12.0–15.0)
MCH: 28.9 pg (ref 26.0–34.0)
MCHC: 32 g/dL (ref 30.0–36.0)
MCV: 90.2 fL (ref 80.0–100.0)
Platelets: 96 10*3/uL — ABNORMAL LOW (ref 150–400)
RBC: 5.33 MIL/uL — ABNORMAL HIGH (ref 3.87–5.11)
RDW: 13.3 % (ref 11.5–15.5)
WBC: 6.5 10*3/uL (ref 4.0–10.5)
nRBC: 0 % (ref 0.0–0.2)

## 2020-01-30 LAB — LIPASE, BLOOD: Lipase: 26 U/L (ref 11–51)

## 2020-01-30 MED ORDER — ACETAMINOPHEN 500 MG PO TABS
1000.0000 mg | ORAL_TABLET | Freq: Once | ORAL | Status: AC
  Administered 2020-01-30: 1000 mg via ORAL
  Filled 2020-01-30: qty 2

## 2020-01-30 MED ORDER — ONDANSETRON 4 MG PO TBDP
4.0000 mg | ORAL_TABLET | Freq: Once | ORAL | Status: AC | PRN
  Administered 2020-01-30: 4 mg via ORAL
  Filled 2020-01-30: qty 1

## 2020-01-30 NOTE — Telephone Encounter (Signed)
Called to Discuss with patient about Covid symptoms and the use of the monoclonal antibody infusion for those with mild to moderate Covid symptoms and at a high risk of hospitalization.     Pt is qualified for this infusion due to co-morbid conditions and/or a member of an at-risk group.     Patient Active Problem List   Diagnosis Date Noted  . S/P left THA, 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  . Overweight (BMI 25.0-29.9) 12/23/2017  . S/P right THA, AA 12/22/2017  . AORTIC STENOSIS, MILD 09/02/2009  . [place], BREAST 08/25/2009  . OSTEOARTHRITIS, HANDS, 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  . VAGINITIS, CANDIDAL 02/20/2009  . URI 02/20/2009  . GANGLION CYST, WRIST, RIGHT 10/20/2008  . Diabetes ([place]) 09/08/2008  . HYPERLIPIDEMIA, MIXED 08/23/2008  . POLYCYTHEMIA, SECONDARY 08/23/2008  . TOBACCO ABUSE 08/11/2008  . TROCHANTERIC BURSITIS, RIGHT 08/11/2008  . COLONIC POLYPS, ADENOMATOUS, HX OF 08/11/2008  . TINEA CORPORIS 01/28/2008  . DEPRESSION/ANXIETY 01/28/2008  . MACULAR DEGENERATION 01/28/2008  . Extrinsic asthma 01/28/2008  . COPD (chronic obstructive pulmonary disease) ([place]) 01/28/2008  . FREQUENCY, URINARY 01/28/2008  . SHOULDER PAIN, LEFT 05/05/2003  . CHEST PAIN 11/18/2002  . DIVERTICULOSIS OF COLON 12/10/2001    Patient declines infusion at this time. Symptoms tier reviewed as well as criteria for ending isolation. Preventative practices reviewed. Patient verbalized understanding.    Patient advised to call back if he/she decides that he/she does want to get infusion. Callback number to the infusion center given. Patient advised to go to Urgent care or ED with severe symptoms.

## 2020-01-30 NOTE — ED Triage Notes (Signed)
Pt to ED feeling poorly since last Wednesday, +covid test on Saturday, now with n/v. Unable to tolerate PO intake.

## 2020-01-31 NOTE — ED Notes (Signed)
Patient states she cant sit and wait any longer

## 2020-02-06 ENCOUNTER — Telehealth: Payer: Self-pay | Admitting: Family Medicine

## 2020-02-06 NOTE — Telephone Encounter (Signed)
Patient has appointment on 02-07-20 with Provider

## 2020-02-06 NOTE — Telephone Encounter (Signed)
Put her in a same day tomorrow

## 2020-02-06 NOTE — Telephone Encounter (Signed)
Patient does not have anymore abx that she was given from [facility] on Saturday 12/11, she's still coughing, she's wheezing and sometimes has shortness of breath.

## 2020-02-06 NOTE — Telephone Encounter (Signed)
Patient tested positive a week ago. She states she was told to follow up in 1 week with Dr. Dennard Schaumann. She states her bronchitis is no better and would like to come in.   CB# [phone]

## 2020-02-07 ENCOUNTER — Ambulatory Visit (INDEPENDENT_AMBULATORY_CARE_PROVIDER_SITE_OTHER): Payer: Medicare Other | Admitting: Family Medicine

## 2020-02-07 ENCOUNTER — Other Ambulatory Visit: Payer: Self-pay

## 2020-02-07 VITALS — BP 110/60 | HR 60 | Temp 97.5°F | Ht 65.0 in | Wt 155.0 lb

## 2020-02-07 DIAGNOSIS — U071 COVID-19: Secondary | ICD-10-CM

## 2020-02-07 DIAGNOSIS — J441 Chronic obstructive pulmonary disease with (acute) exacerbation: Secondary | ICD-10-CM

## 2020-02-07 MED ORDER — PREDNISONE 20 MG PO TABS: ORAL_TABLET | ORAL | 0 refills | Status: DC

## 2020-02-07 NOTE — Progress Notes (Signed)
Subjective:    Patient ID: Kathleen Ramirez, female    DOB: October 20, 1945, 74 y.o.   MRN: [id number]  HPI  Patient is a 74 year old Caucasian female with a history of COPD.  She was diagnosed with Covid on December 11.  She was started on steroids but stopped them after 3 days due to the "being jittery".  She was also having nausea.  She has been using albuterol 2 puffs every 6 hours ever since.  She also was given doxycycline.  She presents today now more than 10 days after symptom onset complaining of not getting any better.  She reports shortness of breath.  She reports wheezing.  She does not want to go back to the hospital.  Pulse oximetry is 90% on room air.  On examination today, there are no crackles.  There are no rails.  She does have diffuse expiratory wheezing and diminished breath sounds bilaterally consistent with COPD. Past Medical History:  Diagnosis Date  . Aortic stenosis 09/02/2009   Mild  . Asthma   . Bilateral ovarian tumors   . Breast fibroadenoma   . Cataract   . Cervical cancer ([place])   . COPD (chronic obstructive pulmonary disease) ([place])   . DDD (degenerative disc disease), cervical   . DDD (degenerative disc disease), lumbar   . Diabetes mellitus without complication ([place])   . Diverticulosis, sigmoid 12/05/2015   Noted on Colonoscopy  . Dysplastic colon polyp   . Forearm fracture 10/18/2011   Right  . Frequent urination   . Ganglion cyst of wrist, right    pt unaware  . History of colon polyps   . HLD (hyperlipidemia)   . Internal hemorrhoids 12/05/2015   Noted on Colonoscopy  . Macular degeneration   . Polycythemia   . PONV (postoperative nausea and vomiting)   . Smoking   . Tumors    5tumors removed  . UTI (lower urinary tract infection)   . Yeast vaginitis    Past Surgical History:  Procedure Laterality Date  . ABDOMINAL HYSTERECTOMY    . APPENDECTOMY    . BLADDER NECK SUSPENSION    . BLADDER SURGERY    . BREAST LUMPECTOMY    . CATARACT  EXTRACTION, BILATERAL    . COLONOSCOPY W/ POLYPECTOMY  12/05/2015  . oophrectomy    . ORIF ULNAR FRACTURE  10/19/2011   Procedure: OPEN REDUCTION INTERNAL FIXATION (ORIF) ULNAR FRACTURE;  Surgeon: Linna Hoff, MD;  Location: [facility];  Service: Orthopedics;  Laterality: Right;  . TONSILLECTOMY    . TOTAL HIP ARTHROPLASTY Right 12/22/2017   Procedure: RIGHT TOTAL HIP ARTHROPLASTY ANTERIOR APPROACH;  Surgeon: Paralee Cancel, MD;  [facility];  Service: Orthopedics;  Laterality: Right;  70  . TOTAL HIP ARTHROPLASTY Left 03/23/2018   Procedure: TOTAL HIP ARTHROPLASTY ANTERIOR APPROACH;  Surgeon: Paralee Cancel, MD;  [facility];  Service: Orthopedics;  Laterality: Left;  70 minutes  . TUBAL LIGATION    . TUMOR EXCISION     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   Current Outpatient Medications on File Prior to Visit  Medication Sig Dispense Refill  . albuterol (VENTOLIN HFA) 108 (90 Base) MCG/ACT inhaler Inhale 2 puffs into the lungs every 4 (four) hours as needed for wheezing or shortness of breath. 1 each 0  . atorvastatin (LIPITOR) 20 MG tablet TAKE 1 TABLET(20 MG) BY MOUTH DAILY 30 tablet 3  . budesonide-formoterol (SYMBICORT) 160-4.5 MCG/ACT inhaler Inhale 2 puffs into the lungs 2 (two)  times daily. 10.2 g 5  . CINNAMON PO Take 1 capsule by mouth daily.    . metFORMIN (GLUCOPHAGE) 500 MG tablet Take 1 tablet (500 mg total) by mouth 2 (two) times daily with a meal. 180 tablet 1  . Respiratory Therapy Supplies (NEBULIZER/TUBING/MOUTHPIECE) KIT Disp one nebulizer machine, tubing set and mouthpiece kit 1 each 0  . sitaGLIPtin (JANUVIA) 100 MG tablet TAKE 1 TABLET(100 MG) BY MOUTH DAILY 30 tablet 3   No current facility-administered medications on file prior to visit.   Allergies  Allergen Reactions  . Codeine Nausea And Vomiting     makes heart race   Social History   Socioeconomic History  . Marital status: Single    Spouse name: Not on file  . Number of children: Not on  file  . Years of education: Not on file  . Highest education level: Not on file  Occupational History  . Not on file  Tobacco Use  . Smoking status: Former Smoker    Packs/day: 0.00    Types: Cigarettes    Quit date: 11/13/2017    Years since quitting: 2.2  . Smokeless tobacco: Never Used  Vaping Use  . Vaping Use: Never used  Substance and Sexual Activity  . Alcohol use: Yes    Comment: rare  . Drug use: No  . Sexual activity: Not on file    Comment: single female partner  Other Topics Concern  . Not on file  Social History Narrative  . Not on file   Social Determinants of Health   Financial Resource Strain: Not on file  Food Insecurity: Not on file  Transportation Needs: Not on file  Physical Activity: Not on file  Stress: Not on file  Social Connections: Not on file  Intimate Partner Violence: Not on file     Review of Systems  All other systems reviewed and are negative.      Objective:   Physical Exam Vitals reviewed.  Constitutional:      Appearance: She is well-developed.  Cardiovascular:     Rate and Rhythm: Normal rate and regular rhythm.     Heart sounds: Normal heart sounds.  Pulmonary:     Effort: Pulmonary effort is normal. No respiratory distress.     Breath sounds: Decreased air movement present. Decreased breath sounds and wheezing present. No rhonchi or rales.  Musculoskeletal:     Cervical back: Neck supple.  Lymphadenopathy:     Cervical: No cervical adenopathy.           Assessment & Plan:  COPD with acute exacerbation ([place])  COVID  Pulse oximetry is borderline for being admitted to the hospital.  Patient refuses to go back to the emergency room.  Therefore I will start her on a prednisone taper pack as I believe the majority of her shortness of breath at this point is due to COPD.  She can take the prednisone along with albuterol 2 puffs every 4-6 hours and then reassess in 24 to 48 hours.  If worsening, she needs to go to the  emergency room immediately for remdesivir and Decadron and admission.  However the present time we will try to manage this as an outpatient.  Patient is comfortable with this plan.

## 2020-03-21 ENCOUNTER — Other Ambulatory Visit: Payer: Self-pay | Admitting: Family Medicine

## 2020-03-29 DIAGNOSIS — H524 Presbyopia: Secondary | ICD-10-CM | POA: Diagnosis not present

## 2020-03-29 DIAGNOSIS — H353221 Exudative age-related macular degeneration, left eye, with active choroidal neovascularization: Secondary | ICD-10-CM | POA: Diagnosis not present

## 2020-04-11 ENCOUNTER — Telehealth: Payer: Self-pay | Admitting: *Deleted

## 2020-04-11 NOTE — Telephone Encounter (Signed)
Received call from patient.   Reports that since having COVID in Dec 2021, she has noted increased loss of hair and hair thinning.   Advised that this is common occurrence with COVID.  According to the [place] [employer], the stress and fever that any illness can bring to the body can cause this. In addition, the association states that getting sick can force more hair into the shedding phase. When people have extreme stress on their body, the hair quits cycling, and those that are caught in this phase -- called the telogen phase -- end up losing their hair. Experts expect the shedding will last Kathleen Ramirez months to a year, and then the hair should grow back. The advice from hairstylists is to take vitamins that help your hair grow, avoid coloring or using heat on your hair, find things to keep stress levels low and check with either a dermatologist or if it is bad enough a hairstylist for options.

## 2020-05-04 DIAGNOSIS — H353221 Exudative age-related macular degeneration, left eye, with active choroidal neovascularization: Secondary | ICD-10-CM | POA: Diagnosis not present

## 2020-05-04 DIAGNOSIS — H26491 Other secondary cataract, right eye: Secondary | ICD-10-CM | POA: Diagnosis not present

## 2020-05-04 DIAGNOSIS — H353112 Nonexudative age-related macular degeneration, right eye, intermediate dry stage: Secondary | ICD-10-CM | POA: Diagnosis not present

## 2020-05-18 ENCOUNTER — Other Ambulatory Visit: Payer: Self-pay

## 2020-05-18 ENCOUNTER — Ambulatory Visit (INDEPENDENT_AMBULATORY_CARE_PROVIDER_SITE_OTHER): Payer: Medicare Other | Admitting: Family Medicine

## 2020-05-18 VITALS — BP 134/74 | HR 89 | Temp 97.5°F | Ht 65.0 in | Wt 170.0 lb

## 2020-05-18 DIAGNOSIS — E1165 Type 2 diabetes mellitus with hyperglycemia: Secondary | ICD-10-CM

## 2020-05-18 DIAGNOSIS — R8290 Unspecified abnormal findings in urine: Secondary | ICD-10-CM | POA: Diagnosis not present

## 2020-05-18 DIAGNOSIS — F439 Reaction to severe stress, unspecified: Secondary | ICD-10-CM | POA: Diagnosis not present

## 2020-05-18 DIAGNOSIS — L659 Nonscarring hair loss, unspecified: Secondary | ICD-10-CM

## 2020-05-18 MED ORDER — CLONAZEPAM 0.5 MG PO TABS: 0.5000 mg | ORAL_TABLET | Freq: Two times a day (BID) | ORAL | 1 refills | Status: DC | PRN

## 2020-05-18 NOTE — Progress Notes (Signed)
Subjective:    Patient ID: Kathleen Ramirez, female    DOB: June 15, 1945, 75 y.o.   MRN: [id number]  HPI  Patient is a 75 year old Caucasian female with a history of COPD, type 2 diabetes mellitus, who was diagnosed with COVID December 11.  She states that her breathing has returned to her baseline however now 3 months after her Covid diagnosis she is started experiencing diffuse hair loss.  She brings in a bag full of hair that she is collected over the last few days.  She states that her hair is thinning out dramatically all over her scalp and no particular pattern.  She primarily seems the thinnest in the center and the crown of her head following an androgenic pattern but she also shows thinning on the sides and posteriorly as well.  There are no punctate areas of hair loss but rather diffuse thinning all over the entire scalp.  She also reports being under extreme stress.  She recently was diagnosed with retinopathy and has been receiving "shots in her eye" ever since.  Her retina specialist states that she may be in jeopardy of losing the vision in her left eye.  This has her extremely anxious.  She is not sleeping well.  She is begging me for something that she could take as needed for anxiety and to help her sleep.  She also is long overdue for lab work.  She is concerned that some of this could be due to her "thyroid given that thyroid runs in my family". Past Medical History:  Diagnosis Date  . Aortic stenosis 09/02/2009   Mild  . Asthma   . Bilateral ovarian tumors   . Breast fibroadenoma   . Cataract   . Cervical cancer ([place])   . COPD (chronic obstructive pulmonary disease) ([place])   . DDD (degenerative disc disease), cervical   . DDD (degenerative disc disease), lumbar   . Diabetes mellitus without complication ([place])   . Diverticulosis, sigmoid 12/05/2015   Noted on Colonoscopy  . Dysplastic colon polyp   . Forearm fracture 10/18/2011   Right  . Frequent urination   . Ganglion cyst  of wrist, right    pt unaware  . History of colon polyps   . HLD (hyperlipidemia)   . Internal hemorrhoids 12/05/2015   Noted on Colonoscopy  . Macular degeneration   . Polycythemia   . PONV (postoperative nausea and vomiting)   . Smoking   . Tumors    5tumors removed  . UTI (lower urinary tract infection)   . Yeast vaginitis    Past Surgical History:  Procedure Laterality Date  . ABDOMINAL HYSTERECTOMY    . APPENDECTOMY    . BLADDER NECK SUSPENSION    . BLADDER SURGERY    . BREAST LUMPECTOMY    . CATARACT EXTRACTION, BILATERAL    . COLONOSCOPY W/ POLYPECTOMY  12/05/2015  . oophrectomy    . ORIF ULNAR FRACTURE  10/19/2011   Procedure: OPEN REDUCTION INTERNAL FIXATION (ORIF) ULNAR FRACTURE;  Surgeon: Linna Hoff, MD;  Location: [facility];  Service: Orthopedics;  Laterality: Right;  . TONSILLECTOMY    . TOTAL HIP ARTHROPLASTY Right 12/22/2017   Procedure: RIGHT TOTAL HIP ARTHROPLASTY ANTERIOR APPROACH;  Surgeon: Paralee Cancel, MD;  [facility];  Service: Orthopedics;  Laterality: Right;  70  . TOTAL HIP ARTHROPLASTY Left 03/23/2018   Procedure: TOTAL HIP ARTHROPLASTY ANTERIOR APPROACH;  Surgeon: Paralee Cancel, MD;  [facility];  Service: Orthopedics;  Laterality:  Left;  70 minutes  . TUBAL LIGATION    . TUMOR EXCISION     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   Current Outpatient Medications on File Prior to Visit  Medication Sig Dispense Refill  . albuterol (VENTOLIN HFA) 108 (90 Base) MCG/ACT inhaler Inhale 2 puffs into the lungs every 4 (four) hours as needed for wheezing or shortness of breath. 1 each 0  . atorvastatin (LIPITOR) 20 MG tablet TAKE 1 TABLET(20 MG) BY MOUTH DAILY 30 tablet 3  . budesonide-formoterol (SYMBICORT) 160-4.5 MCG/ACT inhaler Inhale 2 puffs into the lungs 2 (two) times daily. 10.2 g 5  . CINNAMON PO Take 1 capsule by mouth daily.    . metFORMIN (GLUCOPHAGE) 500 MG tablet TAKE 1 TABLET(500 MG) BY MOUTH TWICE DAILY WITH A MEAL 180 tablet 1   . Respiratory Therapy Supplies (NEBULIZER/TUBING/MOUTHPIECE) KIT Disp one nebulizer machine, tubing set and mouthpiece kit 1 each 0  . sitaGLIPtin (JANUVIA) 100 MG tablet TAKE 1 TABLET(100 MG) BY MOUTH DAILY 30 tablet 3   No current facility-administered medications on file prior to visit.   Allergies  Allergen Reactions  . Codeine Nausea And Vomiting     makes heart race   Social History   Socioeconomic History  . Marital status: Single    Spouse name: Not on file  . Number of children: Not on file  . Years of education: Not on file  . Highest education level: Not on file  Occupational History  . Not on file  Tobacco Use  . Smoking status: Former Smoker    Packs/day: 0.00    Types: Cigarettes    Quit date: 11/13/2017    Years since quitting: 2.5  . Smokeless tobacco: Never Used  Vaping Use  . Vaping Use: Never used  Substance and Sexual Activity  . Alcohol use: Yes    Comment: rare  . Drug use: No  . Sexual activity: Not on file    Comment: single female partner  Other Topics Concern  . Not on file  Social History Narrative  . Not on file   Social Determinants of Health   Financial Resource Strain: Not on file  Food Insecurity: Not on file  Transportation Needs: Not on file  Physical Activity: Not on file  Stress: Not on file  Social Connections: Not on file  Intimate Partner Violence: Not on file     Review of Systems  All other systems reviewed and are negative.      Objective:   Physical Exam Vitals reviewed.  Constitutional:      Appearance: She is well-developed.  HENT:     Right Ear: Tympanic membrane and ear canal normal.     Left Ear: Tympanic membrane and ear canal normal.  Neck:     Vascular: No carotid bruit.  Cardiovascular:     Rate and Rhythm: Normal rate and regular rhythm.     Heart sounds: Normal heart sounds.  Pulmonary:     Effort: Pulmonary effort is normal. No respiratory distress.     Breath sounds: Normal breath sounds.  Decreased air movement present. No wheezing, rhonchi or rales.  Abdominal:     General: Abdomen is flat. Bowel sounds are normal.     Palpations: Abdomen is soft.  Musculoskeletal:     Cervical back: Neck supple. No tenderness.     Right lower leg: No edema.     Left lower leg: No edema.  Lymphadenopathy:     Cervical: No cervical  adenopathy.  Skin:    Findings: No rash.           Assessment & Plan:  Type 2 diabetes mellitus with hyperglycemia, without long-term current use of insulin ([place]) - Plan: CBC with Differential/Platelet, COMPLETE METABOLIC PANEL WITH GFR, Hemoglobin A1c, Lipid panel  Alopecia - Plan: TSH  Urine finding - Plan: Urinalysis, Routine w reflex microscopic, Urine Culture While the patient is here today fasting I will check a CBC, CMP, fasting lipid panel and an A1c.  Goal LDL cholesterol is less than 100.  Goal A1c is less than 6.5.  Due to her diffuse alopecia I will check a TSH.  However I believe the patient is dealing with a combination of telogen effluvium due to the severe stress that she went through with COVID in December and January of last year coupled with some androgenic alopecia.  Assuming that the TSH is normal, I have recommended that she try Rogaine for women.  If this does not help we could also consider off label Propecia.  I will also give the patient Klonopin 0.5 mg p.o. daily 12 hours as needed anxiety and insomnia to help with her situational anxiety.  She also request a urinalysis because she is concerned that she may have a bladder infection although she denies dysuria.  She states that her urine has been darker recently".

## 2020-05-19 LAB — COMPLETE METABOLIC PANEL WITH GFR
AG Ratio: 1.7 (calc) (ref 1.0–2.5)
ALT: 17 U/L (ref 6–29)
AST: 17 U/L (ref 10–35)
Albumin: 4.4 g/dL (ref 3.6–5.1)
Alkaline phosphatase (APISO): 49 U/L (ref 37–153)
BUN/Creatinine Ratio: 21 (calc) (ref 6–22)
BUN: 20 mg/dL (ref 7–25)
CO2: 24 mmol/L (ref 20–32)
Calcium: 9.9 mg/dL (ref 8.6–10.4)
Chloride: 99 mmol/L (ref 98–110)
Creat: 0.96 mg/dL — ABNORMAL HIGH (ref 0.60–0.93)
GFR, Est African American: 68 mL/min/{1.73_m2} (ref >=60)
GFR, Est Non African American: 59 mL/min/{1.73_m2} — ABNORMAL LOW (ref >=60)
Globulin: 2.6 g/dL (calc) (ref 1.9–3.7)
Glucose, Bld: 187 mg/dL — ABNORMAL HIGH (ref 65–99)
Potassium: 4.7 mmol/L (ref 3.5–5.3)
Sodium: 138 mmol/L (ref 135–146)
Total Bilirubin: 0.6 mg/dL (ref 0.2–1.2)
Total Protein: 7 g/dL (ref 6.1–8.1)

## 2020-05-19 LAB — CBC WITH DIFFERENTIAL/PLATELET
Absolute Monocytes: 694 cells/uL (ref 200–950)
Basophils Absolute: 98 cells/uL (ref 0–200)
Basophils Relative: 1.1 %
Eosinophils Absolute: 294 cells/uL (ref 15–500)
Eosinophils Relative: 3.3 %
HCT: 51.3 % — ABNORMAL HIGH (ref 35.0–45.0)
Hemoglobin: 17.1 g/dL — ABNORMAL HIGH (ref 11.7–15.5)
Lymphs Abs: 2919 cells/uL (ref 850–3900)
MCH: 29 pg (ref 27.0–33.0)
MCHC: 33.3 g/dL (ref 32.0–36.0)
MCV: 87.1 fL (ref 80.0–100.0)
MPV: 11.1 fL (ref 7.5–12.5)
Monocytes Relative: 7.8 %
Neutro Abs: 4895 cells/uL (ref 1500–7800)
Neutrophils Relative %: 55 %
Platelets: 188 10*3/uL (ref 140–400)
RBC: 5.89 10*6/uL — ABNORMAL HIGH (ref 3.80–5.10)
RDW: 12 % (ref 11.0–15.0)
Total Lymphocyte: 32.8 %
WBC: 8.9 10*3/uL (ref 3.8–10.8)

## 2020-05-19 LAB — HEMOGLOBIN A1C
Hgb A1c MFr Bld: 9.1 % of total Hgb — ABNORMAL HIGH (ref <5.7)
Mean Plasma Glucose: 214 mg/dL
eAG (mmol/L): 11.9 mmol/L

## 2020-05-19 LAB — URINALYSIS, ROUTINE W REFLEX MICROSCOPIC
Bilirubin Urine: NEGATIVE
Hgb urine dipstick: NEGATIVE
Ketones, ur: NEGATIVE
Leukocytes,Ua: NEGATIVE
Nitrite: NEGATIVE
Protein, ur: NEGATIVE
Specific Gravity, Urine: 1.007 (ref 1.001–1.03)
pH: 6.5 (ref 5.0–8.0)

## 2020-05-19 LAB — LIPID PANEL
Cholesterol: 145 mg/dL (ref <200)
HDL: 50 mg/dL (ref >=50)
LDL Cholesterol (Calc): 71 mg/dL (calc)
Non-HDL Cholesterol (Calc): 95 mg/dL (calc) (ref <130)
Total CHOL/HDL Ratio: 2.9 (calc) (ref <5.0)
Triglycerides: 166 mg/dL — ABNORMAL HIGH (ref <150)

## 2020-05-19 LAB — TSH: TSH: 1.07 mIU/L (ref 0.40–4.50)

## 2020-05-20 LAB — URINE CULTURE
MICRO NUMBER:: 11721150
SPECIMEN QUALITY:: ADEQUATE

## 2020-05-25 ENCOUNTER — Encounter: Payer: Self-pay | Admitting: *Deleted

## 2020-05-25 ENCOUNTER — Telehealth: Payer: Self-pay | Admitting: Family Medicine

## 2020-05-25 MED ORDER — TRULICITY 0.75 MG/0.5ML ~~LOC~~ SOAJ: 0.7500 mg | SUBCUTANEOUS | 0 refills | Status: AC

## 2020-05-25 MED ORDER — TRULICITY 1.5 MG/0.5ML ~~LOC~~ SOAJ: 1.5000 mg | SUBCUTANEOUS | 0 refills | Status: DC

## 2020-05-25 NOTE — Telephone Encounter (Signed)
Patient called to follow up on previous call for lab results. Please advise at [phone].

## 2020-05-25 NOTE — Telephone Encounter (Signed)
Multiple attempts have been made to contact patient.   Letter sent.

## 2020-06-06 DIAGNOSIS — H353221 Exudative age-related macular degeneration, left eye, with active choroidal neovascularization: Secondary | ICD-10-CM | POA: Diagnosis not present

## 2020-06-14 ENCOUNTER — Other Ambulatory Visit: Payer: Self-pay | Admitting: Family Medicine

## 2020-06-16 ENCOUNTER — Other Ambulatory Visit: Payer: Self-pay | Admitting: Family Medicine

## 2020-06-16 DIAGNOSIS — E1165 Type 2 diabetes mellitus with hyperglycemia: Secondary | ICD-10-CM

## 2020-07-06 DIAGNOSIS — H353221 Exudative age-related macular degeneration, left eye, with active choroidal neovascularization: Secondary | ICD-10-CM | POA: Diagnosis not present

## 2020-07-15 ENCOUNTER — Other Ambulatory Visit: Payer: Self-pay | Admitting: Family Medicine

## 2020-08-03 DIAGNOSIS — H353221 Exudative age-related macular degeneration, left eye, with active choroidal neovascularization: Secondary | ICD-10-CM | POA: Diagnosis not present

## 2020-08-10 DIAGNOSIS — M7062 Trochanteric bursitis, left hip: Secondary | ICD-10-CM | POA: Diagnosis not present

## 2020-09-07 DIAGNOSIS — H353221 Exudative age-related macular degeneration, left eye, with active choroidal neovascularization: Secondary | ICD-10-CM | POA: Diagnosis not present

## 2020-09-13 ENCOUNTER — Other Ambulatory Visit: Payer: Self-pay | Admitting: Family Medicine

## 2020-10-12 ENCOUNTER — Other Ambulatory Visit: Payer: Self-pay | Admitting: Family Medicine

## 2020-10-12 DIAGNOSIS — H353221 Exudative age-related macular degeneration, left eye, with active choroidal neovascularization: Secondary | ICD-10-CM | POA: Diagnosis not present

## 2020-10-12 DIAGNOSIS — H353112 Nonexudative age-related macular degeneration, right eye, intermediate dry stage: Secondary | ICD-10-CM | POA: Diagnosis not present

## 2020-11-09 ENCOUNTER — Other Ambulatory Visit: Payer: Self-pay | Admitting: Family Medicine

## 2020-11-09 DIAGNOSIS — E1165 Type 2 diabetes mellitus with hyperglycemia: Secondary | ICD-10-CM

## 2020-11-23 DIAGNOSIS — H353112 Nonexudative age-related macular degeneration, right eye, intermediate dry stage: Secondary | ICD-10-CM | POA: Diagnosis not present

## 2020-11-23 DIAGNOSIS — H353221 Exudative age-related macular degeneration, left eye, with active choroidal neovascularization: Secondary | ICD-10-CM | POA: Diagnosis not present

## 2021-01-18 DIAGNOSIS — H353221 Exudative age-related macular degeneration, left eye, with active choroidal neovascularization: Secondary | ICD-10-CM | POA: Diagnosis not present

## 2021-02-15 ENCOUNTER — Telehealth: Payer: Self-pay | Admitting: Family Medicine

## 2021-02-15 NOTE — Chronic Care Management (AMB) (Signed)
°  Chronic Care Management   Outreach Note  02/15/2021 Name: Kathleen Ramirez MRN: [id number] DOB: 1945-03-22  Referred by: Susy Frizzle, MD Reason for referral : No chief complaint on file.   An unsuccessful telephone outreach was attempted today. The patient was referred to the pharmacist for assistance with care management and care coordination.   Follow Up Plan:   Tatjana Dellinger Upstream Scheduler

## 2021-03-17 ENCOUNTER — Other Ambulatory Visit: Payer: Self-pay | Admitting: Family Medicine

## 2021-03-22 ENCOUNTER — Other Ambulatory Visit: Payer: Self-pay | Admitting: Family Medicine

## 2021-03-22 DIAGNOSIS — E1165 Type 2 diabetes mellitus with hyperglycemia: Secondary | ICD-10-CM

## 2021-03-22 DIAGNOSIS — H353221 Exudative age-related macular degeneration, left eye, with active choroidal neovascularization: Secondary | ICD-10-CM | POA: Diagnosis not present

## 2021-05-17 DIAGNOSIS — H353112 Nonexudative age-related macular degeneration, right eye, intermediate dry stage: Secondary | ICD-10-CM | POA: Diagnosis not present

## 2021-06-13 DIAGNOSIS — H524 Presbyopia: Secondary | ICD-10-CM | POA: Diagnosis not present

## 2021-06-13 DIAGNOSIS — H353222 Exudative age-related macular degeneration, left eye, with inactive choroidal neovascularization: Secondary | ICD-10-CM | POA: Diagnosis not present

## 2021-07-12 DIAGNOSIS — H353221 Exudative age-related macular degeneration, left eye, with active choroidal neovascularization: Secondary | ICD-10-CM | POA: Diagnosis not present

## 2021-08-13 ENCOUNTER — Ambulatory Visit (INDEPENDENT_AMBULATORY_CARE_PROVIDER_SITE_OTHER): Payer: Medicare Other | Admitting: Family Medicine

## 2021-08-13 VITALS — BP 132/80 | HR 63 | Temp 98.3°F | Ht 65.0 in | Wt 165.0 lb

## 2021-08-13 DIAGNOSIS — E1165 Type 2 diabetes mellitus with hyperglycemia: Secondary | ICD-10-CM | POA: Diagnosis not present

## 2021-08-13 DIAGNOSIS — R42 Dizziness and giddiness: Secondary | ICD-10-CM | POA: Diagnosis not present

## 2021-08-13 MED ORDER — CLONAZEPAM 0.5 MG PO TABS: 0.5000 mg | ORAL_TABLET | Freq: Two times a day (BID) | ORAL | 1 refills | Status: DC | PRN

## 2021-08-13 MED ORDER — MECLIZINE HCL 25 MG PO TABS
25.0000 mg | ORAL_TABLET | Freq: Three times a day (TID) | ORAL | 0 refills | Status: DC | PRN
Start: 2021-08-13 — End: 2022-08-08

## 2021-08-14 LAB — CBC WITH DIFFERENTIAL/PLATELET
Absolute Monocytes: 747 cells/uL (ref 200–950)
Basophils Absolute: 97 cells/uL (ref 0–200)
Basophils Relative: 1 %
Eosinophils Absolute: 340 cells/uL (ref 15–500)
Eosinophils Relative: 3.5 %
HCT: 47.2 % — ABNORMAL HIGH (ref 35.0–45.0)
Hemoglobin: 15.8 g/dL — ABNORMAL HIGH (ref 11.7–15.5)
Lymphs Abs: 3560 cells/uL (ref 850–3900)
MCH: 29.3 pg (ref 27.0–33.0)
MCHC: 33.5 g/dL (ref 32.0–36.0)
MCV: 87.4 fL (ref 80.0–100.0)
MPV: 11.4 fL (ref 7.5–12.5)
Monocytes Relative: 7.7 %
Neutro Abs: 4957 cells/uL (ref 1500–7800)
Neutrophils Relative %: 51.1 %
Platelets: 210 10*3/uL (ref 140–400)
RBC: 5.4 10*6/uL — ABNORMAL HIGH (ref 3.80–5.10)
RDW: 12 % (ref 11.0–15.0)
Total Lymphocyte: 36.7 %
WBC: 9.7 10*3/uL (ref 3.8–10.8)

## 2021-08-14 LAB — HEMOGLOBIN A1C
Hgb A1c MFr Bld: 9.8 % of total Hgb — ABNORMAL HIGH (ref <5.7)
Mean Plasma Glucose: 235 mg/dL
eAG (mmol/L): 13 mmol/L

## 2021-08-14 LAB — COMPLETE METABOLIC PANEL WITH GFR
AG Ratio: 1.9 (calc) (ref 1.0–2.5)
ALT: 17 U/L (ref 6–29)
AST: 15 U/L (ref 10–35)
Albumin: 4.3 g/dL (ref 3.6–5.1)
Alkaline phosphatase (APISO): 49 U/L (ref 37–153)
BUN/Creatinine Ratio: 15 (calc) (ref 6–22)
BUN: 16 mg/dL (ref 7–25)
CO2: 24 mmol/L (ref 20–32)
Calcium: 9.3 mg/dL (ref 8.6–10.4)
Chloride: 102 mmol/L (ref 98–110)
Creat: 1.06 mg/dL — ABNORMAL HIGH (ref 0.60–1.00)
Globulin: 2.3 g/dL (calc) (ref 1.9–3.7)
Glucose, Bld: 227 mg/dL — ABNORMAL HIGH (ref 65–99)
Potassium: 4.3 mmol/L (ref 3.5–5.3)
Sodium: 136 mmol/L (ref 135–146)
Total Bilirubin: 0.4 mg/dL (ref 0.2–1.2)
Total Protein: 6.6 g/dL (ref 6.1–8.1)
eGFR: 55 mL/min/{1.73_m2} — ABNORMAL LOW (ref >=60)

## 2021-08-14 LAB — MICROALBUMIN, URINE: Microalb, Ur: 0.7 mg/dL

## 2021-08-26 ENCOUNTER — Ambulatory Visit: Payer: Medicare Other | Admitting: Family Medicine

## 2021-09-06 DIAGNOSIS — H353221 Exudative age-related macular degeneration, left eye, with active choroidal neovascularization: Secondary | ICD-10-CM | POA: Diagnosis not present

## 2021-09-25 ENCOUNTER — Other Ambulatory Visit: Payer: Self-pay | Admitting: Family Medicine

## 2021-09-25 DIAGNOSIS — E1165 Type 2 diabetes mellitus with hyperglycemia: Secondary | ICD-10-CM

## 2021-09-25 NOTE — Telephone Encounter (Signed)
Requested Prescriptions  Pending Prescriptions Disp Refills  . atorvastatin (LIPITOR) 20 MG tablet [Pharmacy Med Name: ATORVASTATIN '20MG'$  TABLETS] 90 tablet 0    Sig: TAKE 1 TABLET(20 MG) BY MOUTH DAILY     Cardiovascular:  Antilipid - Statins Failed - 09/25/2021  3:33 AM      Failed - Valid encounter within last 12 months    Recent Outpatient Visits          1 year ago Type 2 diabetes mellitus with hyperglycemia, without long-term current use of insulin ([place])   [place] Pickard, Cammie Mcgee, MD   1 year ago COPD with acute exacerbation ([place])   [place] Susy Frizzle, MD   2 years ago Type 2 diabetes mellitus with hyperglycemia, without long-term current use of insulin ([place])   [place], Modena Nunnery, MD   3 years ago COPD with acute exacerbation ([place])   [place] Delsa Grana, PA-C   3 years ago Controlled type 2 diabetes mellitus with complication, without long-term current use of insulin ([place])   [place] Pickard, Cammie Mcgee, MD      Future Appointments            In 2 weeks Pickard, Cammie Mcgee, MD [place], [facility]           Failed - Lipid Panel in normal range within the last 12 months    Cholesterol  Date Value Ref Range Status  05/18/2020 145 <200 mg/dL Final   LDL Cholesterol (Calc)  Date Value Ref Range Status  05/18/2020 71 mg/dL (calc) Final    Comment:    Reference range: <100 . Desirable range <100 mg/dL for primary prevention;   <70 mg/dL for patients with CHD or diabetic patients  with > or = 2 CHD risk factors. [place] Kitchen LDL-C is now calculated using the Martin-Hopkins  calculation, which is a validated novel method providing  better accuracy than the Friedewald equation in the  estimation of LDL-C.  Cresenciano Genre et al. Annamaria Helling. 2778;242(35): 2061-2068  (http://education.[employer].com/faq/FAQ164)    HDL  Date Value Ref Range Status  05/18/2020  50 > OR = 50 mg/dL Final   Triglycerides  Date Value Ref Range Status  05/18/2020 166 (H) <150 mg/dL Final         Passed - Patient is not pregnant      . sitaGLIPtin (JANUVIA) 100 MG tablet [Pharmacy Med Name: JANUVIA '100MG'$  TABLETS] 90 tablet 0    Sig: TAKE 1 TABLET(100 MG) BY MOUTH DAILY     Endocrinology:  Diabetes - DPP-4 Inhibitors Failed - 09/25/2021  3:33 AM      Failed - HBA1C is between 0 and 7.9 and within 180 days    Hgb A1c MFr Bld  Date Value Ref Range Status  08/13/2021 9.8 (H) <5.7 % of total Hgb Final    Comment:    For someone without known diabetes, a hemoglobin A1c value of 6.5% or greater indicates that they may have  diabetes and this should be confirmed with a follow-up  test. . For someone with known diabetes, a value <7% indicates  that their diabetes is well controlled and a value  greater than or equal to 7% indicates suboptimal  control. A1c targets should be individualized based on  duration of diabetes, age, comorbid conditions, and  other considerations. . Currently, no consensus exists regarding use of hemoglobin A1c for diagnosis of diabetes for  children. .          Failed - Cr in normal range and within 360 days    Creat  Date Value Ref Range Status  08/13/2021 1.06 (H) 0.60 - 1.00 mg/dL Final         Failed - Valid encounter within last 6 months    Recent Outpatient Visits          1 year ago Type 2 diabetes mellitus with hyperglycemia, without long-term current use of insulin ([place])   [place] Pickard, Cammie Mcgee, MD   1 year ago COPD with acute exacerbation ([place])   [place] Susy Frizzle, MD   2 years ago Type 2 diabetes mellitus with hyperglycemia, without long-term current use of insulin ([place])   [place], Modena Nunnery, MD   3 years ago COPD with acute exacerbation ([place])   [place] Delsa Grana, PA-C   3 years ago Controlled type 2 diabetes  mellitus with complication, without long-term current use of insulin ([place])   [place] Pickard, Cammie Mcgee, MD      Future Appointments            In 2 weeks Pickard, Cammie Mcgee, MD [place]

## 2021-10-10 ENCOUNTER — Ambulatory Visit: Payer: Medicare Other | Admitting: Family Medicine

## 2021-10-28 ENCOUNTER — Encounter: Payer: Self-pay | Admitting: Family Medicine

## 2021-10-28 LAB — HM DIABETES EYE EXAM

## 2021-11-01 DIAGNOSIS — H353221 Exudative age-related macular degeneration, left eye, with active choroidal neovascularization: Secondary | ICD-10-CM | POA: Diagnosis not present

## 2022-01-06 ENCOUNTER — Other Ambulatory Visit: Payer: Self-pay | Admitting: Family Medicine

## 2022-01-06 DIAGNOSIS — E1165 Type 2 diabetes mellitus with hyperglycemia: Secondary | ICD-10-CM

## 2022-01-14 ENCOUNTER — Telehealth: Payer: Self-pay | Admitting: Family Medicine

## 2022-01-14 NOTE — Telephone Encounter (Signed)
Patient is on our care gap list for needing an A1C. I have left vm asking pt to return my call so we can schedule blood work.

## 2022-01-15 ENCOUNTER — Other Ambulatory Visit: Payer: Self-pay | Admitting: Family Medicine

## 2022-01-16 ENCOUNTER — Other Ambulatory Visit: Payer: Self-pay | Admitting: Family Medicine

## 2022-01-16 ENCOUNTER — Other Ambulatory Visit: Payer: Self-pay

## 2022-01-17 DIAGNOSIS — H353112 Nonexudative age-related macular degeneration, right eye, intermediate dry stage: Secondary | ICD-10-CM | POA: Diagnosis not present

## 2022-01-17 DIAGNOSIS — H353221 Exudative age-related macular degeneration, left eye, with active choroidal neovascularization: Secondary | ICD-10-CM | POA: Diagnosis not present

## 2022-01-17 DIAGNOSIS — H43813 Vitreous degeneration, bilateral: Secondary | ICD-10-CM | POA: Diagnosis not present

## 2022-01-17 DIAGNOSIS — H04123 Dry eye syndrome of bilateral lacrimal glands: Secondary | ICD-10-CM | POA: Diagnosis not present

## 2022-01-24 NOTE — Telephone Encounter (Signed)
Patient returned missed call; stated she will call back after the first of the year to schedule the appointment. She's grieving the loss of her sister who just died the other day.

## 2022-04-09 ENCOUNTER — Other Ambulatory Visit: Payer: Self-pay | Admitting: Family Medicine

## 2022-04-09 DIAGNOSIS — E1165 Type 2 diabetes mellitus with hyperglycemia: Secondary | ICD-10-CM

## 2022-04-11 DIAGNOSIS — H353221 Exudative age-related macular degeneration, left eye, with active choroidal neovascularization: Secondary | ICD-10-CM | POA: Diagnosis not present

## 2022-04-22 ENCOUNTER — Telehealth: Payer: Self-pay | Admitting: Family Medicine

## 2022-04-22 NOTE — Telephone Encounter (Signed)
Outbound call placed to schedule Medicare AWV with patient. She stated she's rather complete that portion of her visit with Dr. Dennard Schaumann. Patient advised Dr. Dennard Schaumann doesn't complete that portion of the visit but she didn't agree to schedule with me.  Nothing further needed at this time.

## 2022-06-20 DIAGNOSIS — H353221 Exudative age-related macular degeneration, left eye, with active choroidal neovascularization: Secondary | ICD-10-CM | POA: Diagnosis not present

## 2022-07-01 ENCOUNTER — Encounter: Payer: Medicare Other | Admitting: Family Medicine

## 2022-07-13 ENCOUNTER — Other Ambulatory Visit: Payer: Self-pay | Admitting: Family Medicine

## 2022-07-13 ENCOUNTER — Ambulatory Visit (HOSPITAL_COMMUNITY)
Admission: EM | Admit: 2022-07-13 | Discharge: 2022-07-13 | Disposition: A | Discharge status: Home / Self Care | Payer: Medicare Other | Attending: Emergency Medicine | Admitting: Emergency Medicine

## 2022-07-13 ENCOUNTER — Encounter (HOSPITAL_COMMUNITY): Payer: Self-pay | Admitting: Emergency Medicine

## 2022-07-13 DIAGNOSIS — L259 Unspecified contact dermatitis, unspecified cause: Secondary | ICD-10-CM | POA: Diagnosis not present

## 2022-07-13 DIAGNOSIS — W57XXXA Bitten or stung by nonvenomous insect and other nonvenomous arthropods, initial encounter: Secondary | ICD-10-CM

## 2022-07-13 DIAGNOSIS — S20461A Insect bite (nonvenomous) of right back wall of thorax, initial encounter: Secondary | ICD-10-CM

## 2022-07-13 DIAGNOSIS — E1165 Type 2 diabetes mellitus with hyperglycemia: Secondary | ICD-10-CM

## 2022-07-13 MED ORDER — TRIAMCINOLONE ACETONIDE 0.1 % EX CREA: 1.0000 | TOPICAL_CREAM | Freq: Two times a day (BID) | CUTANEOUS | 0 refills | Status: DC

## 2022-07-13 NOTE — ED Triage Notes (Addendum)
Pt reports that lives alone and had an area on her back that was itching. Got someone to look at it Friday and was a tick that was "embedded". Reports that took a while to get the entire tick out as head was stuck. Reports red area and itching that is not relieved by Benadryl. Pt concerned as her first tick bite ever.

## 2022-07-13 NOTE — ED Provider Notes (Signed)
[facility]    CSN: [id number] Arrival date & time: 07/13/22  1257      History   Chief Complaint Chief Complaint  Patient presents with   Insect Bite    HPI Kathleen Ramirez is a 77 y.o. female.   Patient presents to clinic over concern for a tick bite.  She noticed that she had an area on her back that was itchy, initially thought it was a mosquito or other bug bite.  She had someone look at the area and remove an embedded tick on Friday.  She is concerned that the head remains in her back.  The area is red and itchy, patient has used Benadryl without relief.    The history is provided by the patient and medical records.    Past Medical History:  Diagnosis Date   Aortic stenosis 09/02/2009   Mild   Asthma    Bilateral ovarian tumors    Breast fibroadenoma    Cataract    Cervical cancer (HCC)    COPD (chronic obstructive pulmonary disease) (HCC)    DDD (degenerative disc disease), cervical    DDD (degenerative disc disease), lumbar    Diabetes mellitus without complication (HCC)    Diverticulosis, sigmoid 12/05/2015   Noted on Colonoscopy   Dysplastic colon polyp    Forearm fracture 10/18/2011   Right   Frequent urination    Ganglion cyst of wrist, right    pt unaware   History of colon polyps    HLD (hyperlipidemia)    Internal hemorrhoids 12/05/2015   Noted on Colonoscopy   Macular degeneration    Polycythemia    PONV (postoperative nausea and vomiting)    Smoking    Tumors    5tumors removed   UTI (lower urinary tract infection)    Yeast vaginitis     Patient Active Problem List   Diagnosis Date Noted   S/P left THA, 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   Overweight (BMI 25.0-29.9) 12/23/2017   S/P right THA, AA 12/22/2017   AORTIC STENOSIS, MILD 09/02/2009   FIBROADENOMA, BREAST 08/25/2009   OSTEOARTHRITIS, HANDS, 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   VAGINITIS, CANDIDAL 02/20/2009   URI 02/20/2009   GANGLION CYST, WRIST, RIGHT 10/20/2008   Diabetes (HCC) 09/08/2008    HYPERLIPIDEMIA, MIXED 08/23/2008   POLYCYTHEMIA, SECONDARY 08/23/2008   TOBACCO ABUSE 08/11/2008   TROCHANTERIC BURSITIS, RIGHT 08/11/2008   COLONIC POLYPS, ADENOMATOUS, HX OF 08/11/2008   TINEA CORPORIS 01/28/2008   DEPRESSION/ANXIETY 01/28/2008   MACULAR DEGENERATION 01/28/2008   Extrinsic asthma 01/28/2008   COPD (chronic obstructive pulmonary disease) (HCC) 01/28/2008   FREQUENCY, URINARY 01/28/2008   SHOULDER PAIN, LEFT 05/05/2003   CHEST PAIN 11/18/2002   DIVERTICULOSIS OF COLON 12/10/2001    Past Surgical History:  Procedure Laterality Date   ABDOMINAL HYSTERECTOMY     APPENDECTOMY     BLADDER NECK SUSPENSION     BLADDER SURGERY     BREAST LUMPECTOMY     CATARACT EXTRACTION, BILATERAL     COLONOSCOPY W/ POLYPECTOMY  12/05/2015   oophrectomy     ORIF ULNAR FRACTURE  10/19/2011   Procedure: OPEN REDUCTION INTERNAL FIXATION (ORIF) ULNAR FRACTURE;  Surgeon: Sharma Covert, MD;  Location: [facility];  Service: Orthopedics;  Laterality: Right;   TONSILLECTOMY     TOTAL HIP ARTHROPLASTY Right 12/22/2017   Procedure: RIGHT TOTAL HIP ARTHROPLASTY ANTERIOR APPROACH;  Surgeon: Durene Romans, MD;  Location: [facility];  Service: Orthopedics;  Laterality: Right;  70   TOTAL HIP ARTHROPLASTY  Left 03/23/2018   Procedure: TOTAL HIP ARTHROPLASTY ANTERIOR APPROACH;  Surgeon: Durene Romans, MD;  Location: [facility];  Service: Orthopedics;  Laterality: Left;  70 minutes   TUBAL LIGATION     TUMOR EXCISION     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      Home Medications    Prior to Admission medications   Medication Sig Start Date End Date Taking? Authorizing Provider  triamcinolone cream (KENALOG) 0.1 % Apply 1 Application topically 2 (two) times daily. 07/13/22  Yes Rinaldo Ratel, [place], FNP  albuterol (VENTOLIN HFA) 108 (90 Base) MCG/ACT inhaler Inhale 2 puffs into the lungs every 4 (four) hours as needed for wheezing or shortness of  breath. Patient not taking: Reported on 08/13/2021 01/28/20   Belinda Fisher, PA-C  atorvastatin (LIPITOR) 20 MG tablet TAKE 1 TABLET(20 MG) BY MOUTH DAILY 04/10/22   Donita Brooks, MD  budesonide-formoterol [facility]) 160-4.5 MCG/ACT inhaler INHALE 2 PUFFS INTO THE LUNGS TWICE DAILY 01/16/22   Donita Brooks, MD  CINNAMON PO Take 1 capsule by mouth daily.    [provider]  clonazePAM (KLONOPIN) 0.5 MG tablet Take 1 tablet (0.5 mg total) by mouth 2 (two) times daily as needed for anxiety. 08/13/21   Donita Brooks, MD  Dulaglutide (TRULICITY) 1.5 MG/0.5ML SOPN Inject 1.5 mg into the skin once a week. Start after completing Trulicity 0.75mg  titration dose. Patient not taking: Reported on 08/13/2021 06/11/20   Donita Brooks, MD  meclizine (ANTIVERT) 25 MG tablet Take 1 tablet (25 mg total) by mouth 3 (three) times daily as needed for dizziness. 08/13/21   Donita Brooks, MD  metFORMIN (GLUCOPHAGE) 500 MG tablet TAKE 1 TABLET(500 MG) BY MOUTH TWICE DAILY WITH A MEAL Patient not taking: Reported on 08/13/2021 03/18/21   Donita Brooks, MD  Respiratory Therapy Supplies (NEBULIZER/TUBING/MOUTHPIECE) KIT Disp one nebulizer machine, tubing set and mouthpiece kit Patient not taking: Reported on 08/13/2021 02/19/18   Danelle Berry, PA-C  sitaGLIPtin (JANUVIA) 100 MG tablet TAKE 1 TABLET(100 MG) BY MOUTH DAILY 04/10/22   Donita Brooks, MD    Family History Family History  Problem Relation Age of Onset   Heart disease Mother     Social History Social History   Tobacco Use   Smoking status: Former    Packs/day: 0    Types: Cigarettes    Quit date: 11/13/2017    Years since quitting: 4.6   Smokeless tobacco: Never  Vaping Use   Vaping Use: Never used  Substance Use Topics   Alcohol use: Yes    Comment: rare   Drug use: No     Allergies   Codeine   Review of Systems Review of Systems  Skin:  Positive for rash and wound.     Physical Exam Triage Vital Signs ED  Triage Vitals [07/13/22 1321]  Enc Vitals Group     BP (!) 150/71     Pulse Rate 70     Resp 18     Temp 98 F (36.7 C)     Temp Source Oral     SpO2 95 %     Weight      Height      Head Circumference      Peak Flow      Pain Score 0     Pain Loc      Pain Edu?  Excl. in GC?    No data found.  Updated Vital Signs BP (!) 150/71 (BP Location: Right Arm)   Pulse 70   Temp 98 F (36.7 C) (Oral)   Resp 18   SpO2 95%   Visual Acuity Right Eye Distance:   Left Eye Distance:   Bilateral Distance:    Right Eye Near:   Left Eye Near:    Bilateral Near:     Physical Exam Vitals and nursing note reviewed.  Constitutional:      Appearance: Normal appearance.  HENT:     Head: Normocephalic and atraumatic.     Right Ear: External ear normal.     Left Ear: External ear normal.     Nose: Nose normal.     Mouth/Throat:     Mouth: Mucous membranes are moist.  Eyes:     Conjunctiva/sclera: Conjunctivae normal.  Cardiovascular:     Rate and Rhythm: Normal rate.  Pulmonary:     Effort: Pulmonary effort is normal. No respiratory distress.  Skin:    General: Skin is warm and dry.     Findings: Erythema and rash present. Rash is urticarial.          Comments: Erythematous urticarial rash to right posterior thoracic area, central scab from tick bite.  Neurological:     General: No focal deficit present.     Mental Status: She is alert and oriented to person, place, and time.  Psychiatric:        Mood and Affect: Mood normal.        Behavior: Behavior normal.      UC Treatments / Results  Labs (all labs ordered are listed, but only abnormal results are displayed) Labs Reviewed - No data to display  EKG   Radiology No results found.  Procedures Procedures (including critical care time)  Medications Ordered in UC Medications - No data to display  Initial Impression / Assessment and Plan / UC Course  I have reviewed the triage vital signs and the nursing  notes.  Pertinent labs & imaging results that were available during my care of the patient were reviewed by me and considered in my medical decision making (see chart for details).  Vitals and triage reviewed, patient is hemodynamically stable.  No apparent retained foreign body on physical exam, discussed if they are it will work its way out naturally.  Erythematous urticarial rash to thoracic area, triamcinolone cream given.  No evidence for Lyme disease, return precautions given.  Patient verbalized understanding, no questions at this time.     Final Clinical Impressions(s) / UC Diagnoses   Final diagnoses:  Tick bite of right back wall of thorax, initial encounter  Contact dermatitis, unspecified contact dermatitis type, unspecified trigger     Discharge Instructions      Please use the topical triamcinolone cream twice daily to help with the itching and the rash.  The area does not appear to be infected at this time and I do not see any evidence of retained insect foreign bodies.  Please return to clinic if your rash continues over the next week, gets worse, or you develop a bull's-eye rash, as these may be indication for antibiotics or further evaluation.     ED Prescriptions     Medication Sig Dispense Auth. Provider   triamcinolone cream (KENALOG) 0.1 % Apply 1 Application topically 2 (two) times daily. 30 g Adilee Lemme, [place], [place]      PDMP not reviewed this encounter.  Elvie Palomo, [place] N, [place] 07/13/22 1345

## 2022-07-13 NOTE — Discharge Instructions (Addendum)
Please use the topical triamcinolone cream twice daily to help with the itching and the rash.  The area does not appear to be infected at this time and I do not see any evidence of retained insect foreign bodies.  Please return to clinic if your rash continues over the next week, gets worse, or you develop a bull's-eye rash, as these may be indication for antibiotics or further evaluation.

## 2022-07-14 ENCOUNTER — Other Ambulatory Visit: Payer: Self-pay | Admitting: Family Medicine

## 2022-07-15 NOTE — Telephone Encounter (Signed)
Courtesy refill. Patient will need to keep upcoming appointment for further refills. Requested Prescriptions  Pending Prescriptions Disp Refills   sitaGLIPtin (JANUVIA) 100 MG tablet [Pharmacy Med Name: JANUVIA 100MG  TABLETS] 30 tablet 0    Sig: TAKE 1 TABLET(100 MG) BY MOUTH DAILY     Endocrinology:  Diabetes - DPP-4 Inhibitors Failed - 07/13/2022 11:05 AM      Failed - HBA1C is between 0 and 7.9 and within 180 days    Hgb A1c MFr Bld  Date Value Ref Range Status  08/13/2021 9.8 (H) <5.7 % of total Hgb Final    Comment:    For someone without known diabetes, a hemoglobin A1c value of 6.5% or greater indicates that they may have  diabetes and this should be confirmed with a follow-up  test. . For someone with known diabetes, a value <7% indicates  that their diabetes is well controlled and a value  greater than or equal to 7% indicates suboptimal  control. A1c targets should be individualized based on  duration of diabetes, age, comorbid conditions, and  other considerations. . Currently, no consensus exists regarding use of hemoglobin A1c for diagnosis of diabetes for children. .          Failed - Cr in normal range and within 360 days    Creat  Date Value Ref Range Status  08/13/2021 1.06 (H) 0.60 - 1.00 mg/dL Final         Failed - Valid encounter within last 6 months    Recent Outpatient Visits           2 years ago Type 2 diabetes mellitus with hyperglycemia, without long-term current use of insulin (HCC)   [facility] Donita Brooks, MD   2 years ago COPD with acute exacerbation (HCC)   [facility] Donita Brooks, MD   3 years ago Type 2 diabetes mellitus with hyperglycemia, without long-term current use of insulin (HCC)   [facility] [place], Velna Hatchet, MD   4 years ago COPD with acute exacerbation (HCC)   [facility] Danelle Berry, PA-C   4 years ago Controlled type 2 diabetes mellitus with  complication, without long-term current use of insulin (HCC)   [facility] Pickard, Priscille Heidelberg, MD       Future Appointments             In 3 weeks Pickard, Priscille Heidelberg, MD [facility]

## 2022-08-01 DIAGNOSIS — H353221 Exudative age-related macular degeneration, left eye, with active choroidal neovascularization: Secondary | ICD-10-CM | POA: Diagnosis not present

## 2022-08-08 ENCOUNTER — Ambulatory Visit (INDEPENDENT_AMBULATORY_CARE_PROVIDER_SITE_OTHER): Payer: Medicare Other | Admitting: Family Medicine

## 2022-08-08 ENCOUNTER — Encounter: Payer: Self-pay | Admitting: Family Medicine

## 2022-08-08 VITALS — BP 130/78 | HR 63 | Temp 97.6°F | Ht 65.0 in | Wt 158.0 lb

## 2022-08-08 DIAGNOSIS — J449 Chronic obstructive pulmonary disease, unspecified: Secondary | ICD-10-CM

## 2022-08-08 DIAGNOSIS — Z8709 Personal history of other diseases of the respiratory system: Secondary | ICD-10-CM

## 2022-08-08 DIAGNOSIS — Z1211 Encounter for screening for malignant neoplasm of colon: Secondary | ICD-10-CM

## 2022-08-08 DIAGNOSIS — E1165 Type 2 diabetes mellitus with hyperglycemia: Secondary | ICD-10-CM

## 2022-08-08 DIAGNOSIS — Z7984 Long term (current) use of oral hypoglycemic drugs: Secondary | ICD-10-CM

## 2022-08-08 DIAGNOSIS — J441 Chronic obstructive pulmonary disease with (acute) exacerbation: Secondary | ICD-10-CM

## 2022-08-08 DIAGNOSIS — Z0001 Encounter for general adult medical examination with abnormal findings: Secondary | ICD-10-CM | POA: Diagnosis not present

## 2022-08-08 DIAGNOSIS — Z1231 Encounter for screening mammogram for malignant neoplasm of breast: Secondary | ICD-10-CM

## 2022-08-08 DIAGNOSIS — Z0000 Encounter for general adult medical examination without abnormal findings: Secondary | ICD-10-CM

## 2022-08-08 LAB — CBC WITH DIFFERENTIAL/PLATELET
Basophils Absolute: 109 cells/uL (ref 0–200)
HCT: 47.5 % — ABNORMAL HIGH (ref 35.0–45.0)
MCHC: 33.5 g/dL (ref 32.0–36.0)
Neutrophils Relative %: 52.4 %
Total Lymphocyte: 36.2 %
WBC: 9.9 10*3/uL (ref 3.8–10.8)

## 2022-08-08 LAB — COMPLETE METABOLIC PANEL WITH GFR
ALT: 19 U/L (ref 6–29)
Glucose, Bld: 170 mg/dL — ABNORMAL HIGH (ref 65–99)
Total Bilirubin: 0.6 mg/dL (ref 0.2–1.2)

## 2022-08-08 LAB — LIPID PANEL: Total CHOL/HDL Ratio: 3.1 (calc) (ref <5.0)

## 2022-08-08 MED ORDER — CLONAZEPAM 0.5 MG PO TABS: 0.5000 mg | ORAL_TABLET | Freq: Two times a day (BID) | ORAL | 1 refills | Status: DC | PRN

## 2022-08-08 MED ORDER — ALBUTEROL SULFATE HFA 108 (90 BASE) MCG/ACT IN AERS
2.0000 | INHALATION_SPRAY | RESPIRATORY_TRACT | 0 refills | Status: DC | PRN
Start: 2022-08-08 — End: 2023-04-22

## 2022-08-08 MED ORDER — METFORMIN HCL 500 MG PO TABS: ORAL_TABLET | ORAL | 1 refills | Status: DC

## 2022-08-08 NOTE — Progress Notes (Signed)
Subjective:    Patient ID: Kathleen Ramirez, female    DOB: 07-11-1945, 77 y.o.   MRN: [id number]   Patient has not been seen in over a year.  She has a history of diabetes mellitus as well as COPD.  She is long overdue for fasting lab work.  She is here for CPE.  She is overdue for mammogram.  She is overdue for colonoscopy.  Her last colonoscopy was in 2017 and they recommended a repeat colonoscopy in 5 years due to tubular adenomas.  She is also overdue for lung cancer screening.  She continues to smoke but she has reduced her cigarette smoking to 7 cigarettes a day.  She is dealing with a lot of stress now.  Her sister passed pain.  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  She is still seeing an eye doctor but she is afraid that she will fail the vision exam.  Her blood pressure today is excellent.  She would like a refill of Klonopin that she uses sparingly occasionally for anxiety as well as insomnia when she feels overwhelmed with stress.  She rarely uses this medication. Past Medical History:  Diagnosis Date   Aortic stenosis 09/02/2009   Mild   Asthma    Bilateral ovarian tumors    Breast fibroadenoma    Cataract    Cervical cancer (HCC)    COPD (chronic obstructive pulmonary disease) (HCC)    DDD (degenerative disc disease), cervical    DDD (degenerative disc disease), lumbar    Diabetes mellitus without complication (HCC)    Diverticulosis, sigmoid 12/05/2015   Noted on Colonoscopy   Dysplastic colon polyp    Forearm fracture 10/18/2011   Right   Frequent urination    Ganglion cyst of wrist, right    pt unaware   History of colon polyps    HLD (hyperlipidemia)    Internal hemorrhoids 12/05/2015   Noted on Colonoscopy   Macular degeneration    Polycythemia    PONV (postoperative nausea and vomiting)    Smoking    Tumors    5tumors removed   UTI (lower urinary tract infection)    Yeast vaginitis    Past Surgical History:  Procedure  Laterality Date   ABDOMINAL HYSTERECTOMY     APPENDECTOMY     BLADDER NECK SUSPENSION     BLADDER SURGERY     BREAST LUMPECTOMY     CATARACT EXTRACTION, BILATERAL     COLONOSCOPY W/ POLYPECTOMY  12/05/2015   oophrectomy     ORIF ULNAR FRACTURE  10/19/2011   Procedure: OPEN REDUCTION INTERNAL FIXATION (ORIF) ULNAR FRACTURE;  Surgeon: Sharma Covert, MD;  [facility]: [facility];  Service: Orthopedics;  Laterality: Right;   TONSILLECTOMY     TOTAL HIP ARTHROPLASTY Right 12/22/2017   Procedure: RIGHT TOTAL HIP ARTHROPLASTY ANTERIOR APPROACH;  Surgeon: Durene Romans, MD;  Location: [facility];  Service: Orthopedics;  Laterality: Right;  70   TOTAL HIP ARTHROPLASTY Left 03/23/2018   Procedure: TOTAL HIP ARTHROPLASTY ANTERIOR APPROACH;  Surgeon: Durene Romans, MD;  Location: [facility];  Service: Orthopedics;  Laterality: Left;  70 minutes   TUBAL LIGATION     TUMOR EXCISION     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   Current Outpatient Medications on File Prior to Visit  Medication Sig Dispense Refill   albuterol (VENTOLIN HFA) 108 (90 Base) MCG/ACT inhaler Inhale 2 puffs into the lungs every 4 (four)  hours as needed for wheezing or shortness of breath. (Patient not taking: Reported on 08/13/2021) 1 each 0   atorvastatin (LIPITOR) 20 MG tablet TAKE 1 TABLET(20 MG) BY MOUTH DAILY 90 tablet 0   budesonide-formoterol (SYMBICORT) 160-4.5 MCG/ACT inhaler INHALE 2 PUFFS INTO THE LUNGS TWICE DAILY 10.2 g 1   CINNAMON PO Take 1 capsule by mouth daily.     clonazePAM (KLONOPIN) 0.5 MG tablet Take 1 tablet (0.5 mg total) by mouth 2 (two) times daily as needed for anxiety. 20 tablet 1   Dulaglutide (TRULICITY) 1.5 MG/0.5ML SOPN Inject 1.5 mg into the skin once a week. Start after completing Trulicity 0.75mg  titration dose. (Patient not taking: Reported on 08/13/2021) 2 mL 0   meclizine (ANTIVERT) 25 MG tablet Take 1 tablet (25 mg total) by mouth 3 (three) times daily as needed for dizziness. 30 tablet 0   metFORMIN  (GLUCOPHAGE) 500 MG tablet TAKE 1 TABLET(500 MG) BY MOUTH TWICE DAILY WITH A MEAL (Patient not taking: Reported on 08/13/2021) 180 tablet 1   Respiratory Therapy Supplies (NEBULIZER/TUBING/MOUTHPIECE) KIT Disp one nebulizer machine, tubing set and mouthpiece kit (Patient not taking: Reported on 08/13/2021) 1 each 0   sitaGLIPtin (JANUVIA) 100 MG tablet TAKE 1 TABLET(100 MG) BY MOUTH DAILY 30 tablet 0   triamcinolone cream (KENALOG) 0.1 % Apply 1 Application topically 2 (two) times daily. 30 g 0   No current facility-administered medications on file prior to visit.   Allergies  Allergen Reactions   Codeine Nausea And Vomiting     makes heart race   Social History   Socioeconomic History   Marital status: Single    Spouse name: Not on file   Number of children: Not on file   Years of education: Not on file   Highest education level: Not on file  Occupational History   Not on file  Tobacco Use   Smoking status: Former    Packs/day: 0    Types: Cigarettes    Quit date: 11/13/2017    Years since quitting: 4.7   Smokeless tobacco: Never  Vaping Use   Vaping Use: Never used  Substance and Sexual Activity   Alcohol use: Yes    Comment: rare   Drug use: No   Sexual activity: Not on file    Comment: single female partner  Other Topics Concern   Not on file  Social History Narrative   Not on file   Social Determinants of Health   Financial Resource Strain: Not on file  Food Insecurity: Not on file  Transportation Needs: Not on file  Physical Activity: Not on file  Stress: Not on file  Social Connections: Not on file  Intimate Partner Violence: Not on file     Review of Systems  All other systems reviewed and are negative.      Objective:   Physical Exam Vitals reviewed.  Constitutional:      Appearance: She is well-developed.  HENT:     Right Ear: Tympanic membrane and ear canal normal.     Left Ear: Tympanic membrane and ear canal normal.  Neck:     Vascular: No  carotid bruit.  Cardiovascular:     Rate and Rhythm: Normal rate and regular rhythm.     Heart sounds: Normal heart sounds.  Pulmonary:     Effort: Pulmonary effort is normal. No respiratory distress.     Breath sounds: Normal breath sounds. Decreased air movement present. No wheezing, rhonchi or rales.  Abdominal:  General: Abdomen is flat. Bowel sounds are normal.     Palpations: Abdomen is soft.  Musculoskeletal:     Cervical back: Neck supple. No tenderness.     Right lower leg: No edema.     Left lower leg: No edema.  Lymphadenopathy:     Cervical: No cervical adenopathy.  Skin:    Findings: No rash.  Neurological:     General: No focal deficit present.     Mental Status: She is oriented to person, place, and time.     Cranial Nerves: No cranial nerve deficit.     Sensory: No sensory deficit.     Motor: No weakness.     Gait: Gait normal.     Deep Tendon Reflexes: Reflexes normal.          Assessment & Plan:  Type 2 diabetes mellitus with hyperglycemia, without long-term current use of insulin (HCC) - Plan: Hemoglobin A1c, CBC with Differential/Platelet, Lipid panel, COMPLETE METABOLIC PANEL WITH GFR, Protein / Creatinine Ratio, Urine  Chronic obstructive pulmonary disease, unspecified COPD type (HCC)  General medical exam  History of asthma - Plan: albuterol (VENTOLIN HFA) 108 (90 Base) MCG/ACT inhaler  COPD with acute exacerbation (HCC) - longer steroid tx, con't symbicort and mucinex, refills of albuterol, neb machine with alb vials for severe bronchospasma and SOB - Plan: albuterol (VENTOLIN HFA) 108 (90 Base) MCG/ACT inhaler  Colon cancer screening - Plan: Ambulatory referral to Gastroenterology  Encounter for screening mammogram for malignant neoplasm of breast - Plan: MM Digital Screening I was able to convince the patient to allow me to schedule her for mammogram.  She also agrees to allow me to schedule her for colonoscopy.  She declines lung cancer  screening with a CT scan.  If she changes her labs do this.  She is due for the shingles vaccine but she defers this today.  Check fasting lipid panel CBC CMP and hemoglobin A1c.  Goal LDL cholesterol is less than 100.  Goal A1c is less than 6.5

## 2022-08-09 LAB — COMPLETE METABOLIC PANEL WITH GFR
AG Ratio: 1.8 (calc) (ref 1.0–2.5)
AST: 17 U/L (ref 10–35)
Albumin: 4.4 g/dL (ref 3.6–5.1)
Alkaline phosphatase (APISO): 54 U/L (ref 37–153)
BUN: 16 mg/dL (ref 7–25)
CO2: 25 mmol/L (ref 20–32)
Calcium: 9.6 mg/dL (ref 8.6–10.4)
Chloride: 103 mmol/L (ref 98–110)
Creat: 0.82 mg/dL (ref 0.60–1.00)
Globulin: 2.4 g/dL (calc) (ref 1.9–3.7)
Potassium: 4.3 mmol/L (ref 3.5–5.3)
Sodium: 138 mmol/L (ref 135–146)
Total Protein: 6.8 g/dL (ref 6.1–8.1)
eGFR: 75 mL/min/{1.73_m2} (ref >=60)

## 2022-08-09 LAB — PROTEIN / CREATININE RATIO, URINE
Creatinine, Urine: 16 mg/dL — ABNORMAL LOW (ref 20–275)
Total Protein, Urine: <4 mg/dL — ABNORMAL LOW (ref 5–24)

## 2022-08-09 LAB — LIPID PANEL
Cholesterol: 156 mg/dL (ref <200)
HDL: 51 mg/dL (ref >=50)
LDL Cholesterol (Calc): 80 mg/dL (calc)
Non-HDL Cholesterol (Calc): 105 mg/dL (calc) (ref <130)
Triglycerides: 148 mg/dL (ref <150)

## 2022-08-09 LAB — CBC WITH DIFFERENTIAL/PLATELET
Absolute Monocytes: 743 cells/uL (ref 200–950)
Basophils Relative: 1.1 %
Eosinophils Absolute: 277 cells/uL (ref 15–500)
Eosinophils Relative: 2.8 %
Hemoglobin: 15.9 g/dL — ABNORMAL HIGH (ref 11.7–15.5)
Lymphs Abs: 3584 cells/uL (ref 850–3900)
MCH: 29.3 pg (ref 27.0–33.0)
MCV: 87.6 fL (ref 80.0–100.0)
MPV: 11.7 fL (ref 7.5–12.5)
Monocytes Relative: 7.5 %
Neutro Abs: 5188 cells/uL (ref 1500–7800)
Platelets: 194 10*3/uL (ref 140–400)
RBC: 5.42 10*6/uL — ABNORMAL HIGH (ref 3.80–5.10)
RDW: 12.7 % (ref 11.0–15.0)

## 2022-08-09 LAB — HEMOGLOBIN A1C
Hgb A1c MFr Bld: 9 % of total Hgb — ABNORMAL HIGH (ref <5.7)
Mean Plasma Glucose: 212 mg/dL
eAG (mmol/L): 11.7 mmol/L

## 2022-08-10 ENCOUNTER — Other Ambulatory Visit: Payer: Self-pay | Admitting: Family Medicine

## 2022-08-10 DIAGNOSIS — E1165 Type 2 diabetes mellitus with hyperglycemia: Secondary | ICD-10-CM

## 2022-08-13 ENCOUNTER — Other Ambulatory Visit: Payer: Self-pay | Admitting: Family Medicine

## 2022-08-13 ENCOUNTER — Other Ambulatory Visit: Payer: Self-pay

## 2022-08-13 DIAGNOSIS — E1165 Type 2 diabetes mellitus with hyperglycemia: Secondary | ICD-10-CM

## 2022-08-13 DIAGNOSIS — E782 Mixed hyperlipidemia: Secondary | ICD-10-CM

## 2022-08-13 MED ORDER — GLIPIZIDE ER 5 MG PO TB24
5.0000 mg | ORAL_TABLET | Freq: Every day | ORAL | 1 refills | Status: DC
Start: 2022-08-13 — End: 2022-10-22

## 2022-08-13 MED ORDER — PIOGLITAZONE HCL 30 MG PO TABS
30.0000 mg | ORAL_TABLET | Freq: Every day | ORAL | 1 refills | Status: DC
Start: 2022-08-13 — End: 2022-10-22

## 2022-08-14 NOTE — Telephone Encounter (Signed)
Requested medication (s) are due for refill today: No  Requested medication (s) are on the active medication list: Yes  Last refill:  08/13/22 30 tabs, 1RF   Future visit scheduled: No  Notes to clinic:  Patient requesting 90-day supply     Requested Prescriptions  Pending Prescriptions Disp Refills   pioglitazone (ACTOS) 30 MG tablet [Pharmacy Med Name: PIOGLITAZONE 30MG  TABLETS] 90 tablet     Sig: TAKE 1 TABLET(30 MG) BY MOUTH DAILY     Endocrinology:  Diabetes - Glitazones - pioglitazone Failed - 08/13/2022  5:06 PM      Failed - HBA1C is between 0 and 7.9 and within 180 days    Hgb A1c MFr Bld  Date Value Ref Range Status  08/08/2022 9.0 (H) <5.7 % of total Hgb Final    Comment:    For someone without known diabetes, a hemoglobin A1c value of 6.5% or greater indicates that they may have  diabetes and this should be confirmed with a follow-up  test. . For someone with known diabetes, a value <7% indicates  that their diabetes is well controlled and a value  greater than or equal to 7% indicates suboptimal  control. A1c targets should be individualized based on  duration of diabetes, age, comorbid conditions, and  other considerations. . Currently, no consensus exists regarding use of hemoglobin A1c for diagnosis of diabetes for children. .          Failed - Valid encounter within last 6 months    Recent Outpatient Visits           2 years ago Type 2 diabetes mellitus with hyperglycemia, without long-term current use of insulin (HCC)   [facility] Pickard, Priscille Heidelberg, MD   2 years ago COPD with acute exacerbation (HCC)   [facility] Donita Brooks, MD   3 years ago Type 2 diabetes mellitus with hyperglycemia, without long-term current use of insulin (HCC)   [facility] [place], Velna Hatchet, MD   4 years ago COPD with acute exacerbation (HCC)   [facility] Danelle Berry, PA-C   4 years ago Controlled  type 2 diabetes mellitus with complication, without long-term current use of insulin (HCC)   [facility] Pickard, Priscille Heidelberg, MD               glipiZIDE (GLUCOTROL XL) 5 MG 24 hr tablet [Pharmacy Med Name: GLIPIZIDE ER 5MG  TABLETS] 90 tablet     Sig: TAKE 1 TABLET(5 MG) BY MOUTH DAILY WITH BREAKFAST     Endocrinology:  Diabetes - Sulfonylureas Failed - 08/13/2022  5:06 PM      Failed - HBA1C is between 0 and 7.9 and within 180 days    Hgb A1c MFr Bld  Date Value Ref Range Status  08/08/2022 9.0 (H) <5.7 % of total Hgb Final    Comment:    For someone without known diabetes, a hemoglobin A1c value of 6.5% or greater indicates that they may have  diabetes and this should be confirmed with a follow-up  test. . For someone with known diabetes, a value <7% indicates  that their diabetes is well controlled and a value  greater than or equal to 7% indicates suboptimal  control. A1c targets should be individualized based on  duration of diabetes, age, comorbid conditions, and  other considerations. . Currently, no consensus exists regarding use of hemoglobin A1c for diagnosis of diabetes for children. Marland Kitchen  Failed - Valid encounter within last 6 months    Recent Outpatient Visits           2 years ago Type 2 diabetes mellitus with hyperglycemia, without long-term current use of insulin (HCC)   [facility] Pickard, Priscille Heidelberg, MD   2 years ago COPD with acute exacerbation (HCC)   [facility] Donita Brooks, MD   3 years ago Type 2 diabetes mellitus with hyperglycemia, without long-term current use of insulin (HCC)   [facility] [place], Velna Hatchet, MD   4 years ago COPD with acute exacerbation (HCC)   [facility] Danelle Berry, PA-C   4 years ago Controlled type 2 diabetes mellitus with complication, without long-term current use of insulin (HCC)   [facility] Pickard, Priscille Heidelberg, MD              Passed - Cr in normal range and within 360 days    Creat  Date Value Ref Range Status  08/08/2022 0.82 0.60 - 1.00 mg/dL Final   Creatinine, Urine  Date Value Ref Range Status  08/08/2022 16 (L) 20 - 275 mg/dL Final

## 2022-08-19 ENCOUNTER — Telehealth: Payer: Self-pay | Admitting: Family Medicine

## 2022-08-19 NOTE — Telephone Encounter (Signed)
Patient called to follow up on new meds and MRI recommended by provider; requesting for either Dr. Tanya Nones or his nurse to return her call. Patient stated she will not start the new medications until she speaks with one of them.   Please advise at [phone].

## 2022-08-20 NOTE — Telephone Encounter (Signed)
Called and spoke w/pt today about pt's msg. Per pt was concern about the the side effects of the Actos, ie problems? Per pt have heart issues. Is it safe?   Pt said you recommended having an MRI?  Pls advice

## 2022-08-22 NOTE — Telephone Encounter (Signed)
Spoke w/pt regarding medication-Actos. Pt is aware of pcp's recommendations. As for MRI. Per pt, wasn't quite sure if it was MRI or what. Will find out when see pcp in 3 mos.   Nothing further.

## 2022-08-29 DIAGNOSIS — H353221 Exudative age-related macular degeneration, left eye, with active choroidal neovascularization: Secondary | ICD-10-CM | POA: Diagnosis not present

## 2022-10-03 DIAGNOSIS — H353221 Exudative age-related macular degeneration, left eye, with active choroidal neovascularization: Secondary | ICD-10-CM | POA: Diagnosis not present

## 2022-10-14 ENCOUNTER — Other Ambulatory Visit: Payer: Self-pay | Admitting: Family Medicine

## 2022-10-16 NOTE — Telephone Encounter (Signed)
Requested Prescriptions  Pending Prescriptions Disp Refills   atorvastatin (LIPITOR) 20 MG tablet [Pharmacy Med Name: ATORVASTATIN 20MG  TABLETS] 90 tablet 3    Sig: TAKE 1 TABLET(20 MG) BY MOUTH DAILY     Cardiovascular:  Antilipid - Statins Failed - 10/14/2022  6:22 PM      Failed - Valid encounter within last 12 months    Recent Outpatient Visits           2 years ago Type 2 diabetes mellitus with hyperglycemia, without long-term current use of insulin (HCC)   [facility] [place], Priscille Heidelberg, MD   2 years ago COPD with acute exacerbation (HCC)   [facility] Donita Brooks, MD   3 years ago Type 2 diabetes mellitus with hyperglycemia, without long-term current use of insulin (HCC)   [facility] [place], Velna Hatchet, MD   4 years ago COPD with acute exacerbation (HCC)   [facility] Danelle Berry, PA-C   4 years ago Controlled type 2 diabetes mellitus with complication, without long-term current use of insulin (HCC)   [facility] [place], Priscille Heidelberg, MD              Failed - Lipid Panel in normal range within the last 12 months    Cholesterol  Date Value Ref Range Status  08/08/2022 156 <200 mg/dL Final   LDL Cholesterol (Calc)  Date Value Ref Range Status  08/08/2022 80 mg/dL (calc) Final    Comment:    Reference range: <100 . Desirable range <100 mg/dL for primary prevention;   <70 mg/dL for patients with CHD or diabetic patients  with > or = 2 CHD risk factors. Marland Kitchen LDL-C is now calculated using the Martin-Hopkins  calculation, which is a validated novel method providing  better accuracy than the Friedewald equation in the  estimation of LDL-C.  Horald Pollen et al. Lenox Ahr. 4098;119(14): 2061-2068  (http://education.[employer].com/faq/FAQ164)    HDL  Date Value Ref Range Status  08/08/2022 51 > OR = 50 mg/dL Final   Triglycerides  Date Value Ref Range Status  08/08/2022 148 <150 mg/dL  Final         Passed - Patient is not pregnant

## 2022-10-20 ENCOUNTER — Other Ambulatory Visit: Payer: Self-pay | Admitting: Family Medicine

## 2022-10-20 DIAGNOSIS — E782 Mixed hyperlipidemia: Secondary | ICD-10-CM

## 2022-10-20 DIAGNOSIS — E1165 Type 2 diabetes mellitus with hyperglycemia: Secondary | ICD-10-CM

## 2022-10-22 NOTE — Telephone Encounter (Signed)
Last OV 08/08/22 Requested Prescriptions  Pending Prescriptions Disp Refills   glipiZIDE (GLUCOTROL XL) 5 MG 24 hr tablet [Pharmacy Med Name: GLIPIZIDE ER 5MG  TABLETS] 30 tablet 1    Sig: TAKE 1 TABLET(5 MG) BY MOUTH DAILY WITH BREAKFAST     Endocrinology:  Diabetes - Sulfonylureas Failed - 10/20/2022 10:53 AM      Failed - HBA1C is between 0 and 7.9 and within 180 days    Hgb A1c MFr Bld  Date Value Ref Range Status  08/08/2022 9.0 (H) <5.7 % of total Hgb Final    Comment:    For someone without known diabetes, a hemoglobin A1c value of 6.5% or greater indicates that they may have  diabetes and this should be confirmed with a follow-up  test. . For someone with known diabetes, a value <7% indicates  that their diabetes is well controlled and a value  greater than or equal to 7% indicates suboptimal  control. A1c targets should be individualized based on  duration of diabetes, age, comorbid conditions, and  other considerations. . Currently, no consensus exists regarding use of hemoglobin A1c for diagnosis of diabetes for children. .          Failed - Valid encounter within last 6 months    Recent Outpatient Visits           2 years ago Type 2 diabetes mellitus with hyperglycemia, without long-term current use of insulin (HCC)   [facility] [place], Priscille Heidelberg, MD   2 years ago COPD with acute exacerbation (HCC)   [facility] Donita Brooks, MD   3 years ago Type 2 diabetes mellitus with hyperglycemia, without long-term current use of insulin (HCC)   [facility] [place], Velna Hatchet, MD   4 years ago COPD with acute exacerbation (HCC)   [facility] Danelle Berry, PA-C   4 years ago Controlled type 2 diabetes mellitus with complication, without long-term current use of insulin (HCC)   [facility] [place], Priscille Heidelberg, MD              Passed - Cr in normal range and within 360 days    Creat   Date Value Ref Range Status  08/08/2022 0.82 0.60 - 1.00 mg/dL Final   Creatinine, Urine  Date Value Ref Range Status  08/08/2022 16 (L) 20 - 275 mg/dL Final          pioglitazone (ACTOS) 30 MG tablet [Pharmacy Med Name: PIOGLITAZONE 30MG  TABLETS] 30 tablet 1    Sig: TAKE 1 TABLET(30 MG) BY MOUTH DAILY     Endocrinology:  Diabetes - Glitazones - pioglitazone Failed - 10/20/2022 10:53 AM      Failed - HBA1C is between 0 and 7.9 and within 180 days    Hgb A1c MFr Bld  Date Value Ref Range Status  08/08/2022 9.0 (H) <5.7 % of total Hgb Final    Comment:    For someone without known diabetes, a hemoglobin A1c value of 6.5% or greater indicates that they may have  diabetes and this should be confirmed with a follow-up  test. . For someone with known diabetes, a value <7% indicates  that their diabetes is well controlled and a value  greater than or equal to 7% indicates suboptimal  control. A1c targets should be individualized based on  duration of diabetes, age, comorbid conditions, and  other considerations. . Currently, no consensus exists regarding use of  hemoglobin A1c for diagnosis of diabetes for children. .          Failed - Valid encounter within last 6 months    Recent Outpatient Visits           2 years ago Type 2 diabetes mellitus with hyperglycemia, without long-term current use of insulin (HCC)   [facility] Donita Brooks, MD   2 years ago COPD with acute exacerbation (HCC)   [facility] Donita Brooks, MD   3 years ago Type 2 diabetes mellitus with hyperglycemia, without long-term current use of insulin (HCC)   [facility] [place], Velna Hatchet, MD   4 years ago COPD with acute exacerbation (HCC)   [facility] Danelle Berry, PA-C   4 years ago Controlled type 2 diabetes mellitus with complication, without long-term current use of insulin (HCC)   [facility] [place],  Priscille Heidelberg, MD

## 2022-11-07 DIAGNOSIS — H353221 Exudative age-related macular degeneration, left eye, with active choroidal neovascularization: Secondary | ICD-10-CM | POA: Diagnosis not present

## 2022-12-01 ENCOUNTER — Telehealth: Payer: Self-pay | Admitting: Internal Medicine

## 2022-12-01 NOTE — Telephone Encounter (Signed)
Good morning Dr. Marina Goodell,   I received a call from this patient wishing to schedule a colonoscopy. Patient has a recall in place but it is from 2022. She is now over the age of 73. Would you like to proceed with the colonoscopy directly or see her in the office first to discuss? Please advise.   Thank you.

## 2022-12-01 NOTE — Telephone Encounter (Signed)
Okay to schedule surveillance colonoscopy. See previsit nurse first.

## 2022-12-04 ENCOUNTER — Encounter: Payer: Self-pay | Admitting: Internal Medicine

## 2022-12-19 DIAGNOSIS — H353221 Exudative age-related macular degeneration, left eye, with active choroidal neovascularization: Secondary | ICD-10-CM | POA: Diagnosis not present

## 2023-01-09 ENCOUNTER — Ambulatory Visit (AMBULATORY_SURGERY_CENTER): Payer: Medicare Other | Admitting: *Deleted

## 2023-01-09 VITALS — Ht 66.0 in | Wt 161.0 lb

## 2023-01-09 DIAGNOSIS — Z860100 Personal history of colon polyps, unspecified: Secondary | ICD-10-CM

## 2023-01-09 MED ORDER — ONDANSETRON HCL 4 MG PO TABS: 4.0000 mg | ORAL_TABLET | Freq: Three times a day (TID) | ORAL | 0 refills | Status: AC | PRN

## 2023-01-09 MED ORDER — NA SULFATE-K SULFATE-MG SULF 17.5-3.13-1.6 GM/177ML PO SOLN: 1.0000 | Freq: Once | ORAL | 0 refills | Status: AC

## 2023-01-09 NOTE — Progress Notes (Signed)
Pre visit completed over telephone.   Instructions mailed.  Patietn advised to notify us if she incurs any changes in health, ER visits, new medications.  Prescription for Zofran sent in the prep.  .No egg or soy allergy known to patient  No issues known to pt with past sedation with any surgeries or procedures Patient denies ever being told they had issues or difficulty with intubation  No FH of Malignant Hyperthermia Pt is not on diet pills Pt is not on  home 02  Pt is not on blood thinners  Pt denies issues with constipation  No A fib or A flutter Have any cardiac testing pending--NO Pt instructed to use [email or web address] or GoodRx for a price reduction on prep

## 2023-02-04 ENCOUNTER — Encounter: Payer: Self-pay | Admitting: Certified Registered"

## 2023-02-13 ENCOUNTER — Ambulatory Visit (AMBULATORY_SURGERY_CENTER): Payer: Medicare Other | Admitting: Internal Medicine

## 2023-02-13 ENCOUNTER — Encounter: Payer: Self-pay | Admitting: Internal Medicine

## 2023-02-13 VITALS — BP 126/64 | HR 69 | Temp 98.0°F | Resp 20 | Ht 65.0 in | Wt 161.0 lb

## 2023-02-13 DIAGNOSIS — K5730 Diverticulosis of large intestine without perforation or abscess without bleeding: Secondary | ICD-10-CM

## 2023-02-13 DIAGNOSIS — R197 Diarrhea, unspecified: Secondary | ICD-10-CM | POA: Diagnosis not present

## 2023-02-13 DIAGNOSIS — Z860100 Personal history of colon polyps, unspecified: Secondary | ICD-10-CM | POA: Diagnosis not present

## 2023-02-13 DIAGNOSIS — K648 Other hemorrhoids: Secondary | ICD-10-CM | POA: Diagnosis not present

## 2023-02-13 DIAGNOSIS — Z860101 Personal history of adenomatous and serrated colon polyps: Secondary | ICD-10-CM

## 2023-02-13 DIAGNOSIS — Z1211 Encounter for screening for malignant neoplasm of colon: Secondary | ICD-10-CM | POA: Diagnosis not present

## 2023-02-13 MED ORDER — SODIUM CHLORIDE 0.9 % IV SOLN: 500.0000 mL | Freq: Once | INTRAVENOUS | Status: DC

## 2023-02-13 NOTE — Progress Notes (Signed)
HISTORY OF PRESENT ILLNESS:  Kathleen Ramirez is a 77 y.o. female with a history of multiple adenomatous colon polyps.  Presents today for surveillance colonoscopy  REVIEW OF SYSTEMS:  All non-GI ROS negative except for  Past Medical History:  Diagnosis Date   Aortic stenosis 09/02/2009   Mild   Asthma    Bilateral ovarian tumors    Breast fibroadenoma    Cataract    Cervical cancer (HCC)    COPD (chronic obstructive pulmonary disease) (HCC)    DDD (degenerative disc disease), cervical    DDD (degenerative disc disease), lumbar    Diabetes mellitus without complication (HCC)    Diverticulosis, sigmoid 12/05/2015   Noted on Colonoscopy   Dysplastic colon polyp    Forearm fracture 10/18/2011   Right   Frequent urination    Ganglion cyst of wrist, right    pt unaware   History of colon polyps    HLD (hyperlipidemia)    Internal hemorrhoids 12/05/2015   Noted on Colonoscopy   Macular degeneration    Polycythemia    PONV (postoperative nausea and vomiting)    Smoking    Tumors    5tumors removed   UTI (lower urinary tract infection)    Yeast vaginitis     Past Surgical History:  Procedure Laterality Date   ABDOMINAL HYSTERECTOMY     APPENDECTOMY     BLADDER NECK SUSPENSION     BLADDER SURGERY     BREAST LUMPECTOMY     CATARACT EXTRACTION, BILATERAL     COLONOSCOPY W/ POLYPECTOMY  12/05/2015   oophrectomy     ORIF ULNAR FRACTURE  10/19/2011   Procedure: OPEN REDUCTION INTERNAL FIXATION (ORIF) ULNAR FRACTURE;  Surgeon: Sharma Covert, MD;  Location: [facility] OR;  Service: Orthopedics;  Laterality: Right;   TONSILLECTOMY     TOTAL HIP ARTHROPLASTY Right 12/22/2017   Procedure: RIGHT TOTAL HIP ARTHROPLASTY ANTERIOR APPROACH;  Surgeon: Durene Romans, MD;  Location: [facility] ORS;  Service: Orthopedics;  Laterality: Right;  70   TOTAL HIP ARTHROPLASTY Left 03/23/2018   Procedure: TOTAL HIP ARTHROPLASTY ANTERIOR APPROACH;  Surgeon: Durene Romans, MD;  Location: [facility] ORS;  Service:  Orthopedics;  Laterality: Left;  70 minutes   TUBAL LIGATION     TUMOR EXCISION     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 She has never used smokeless tobacco. She reports current alcohol use. She reports that she does not use drugs.  family history includes Heart disease in her mother.  Allergies  Allergen Reactions   Codeine Nausea And Vomiting     makes heart race       PHYSICAL EXAMINATION: Vital signs: BP 128/64   Pulse 64   Temp 98 F (36.7 C) (Temporal)   Resp 14   Ht 5\' 5"  (1.651 m)   Wt 161 lb (73 kg)   SpO2 99%   BMI 26.79 kg/m  General: Well-developed, well-nourished, no acute distress HEENT: Sclerae are anicteric, conjunctiva pink. Oral mucosa intact Lungs: Clear Heart: Regular Abdomen: soft, nontender, nondistended, no obvious ascites, no peritoneal signs, normal bowel sounds. No organomegaly. Extremities: No edema Psychiatric: alert and oriented x3. Cooperative     ASSESSMENT:  Personal history of multiple and advanced adenomatous colon polyps   PLAN:  Surveillance colonoscopy

## 2023-02-13 NOTE — Progress Notes (Signed)
I have reviewed the patient's medical history in detail and updated the computerized patient record.

## 2023-02-13 NOTE — Op Note (Signed)
[facility] Patient Name: Kathleen Ramirez Procedure Date: 02/13/2023 9:52 AM MRN: [id number] Endoscopist: Wilhemina Bonito. Marina Goodell , MD, [id number] Age: 77 Referring MD:  Date of Birth: Sep 28, 1945 Gender: Female Account #: [id number] Procedure:                Colonoscopy with biopsies Indications:              High risk colon cancer surveillance: Personal                            history of adenoma (10 mm or greater in size), High                            risk colon cancer surveillance: Personal history of                            multiple (3 or more) adenomas, Incidental diarrhea                            noted. Previous examinations 2001, 2003, 2017 Medicines:                Monitored Anesthesia Care Procedure:                Pre-Anesthesia Assessment:                           - Prior to the procedure, a History and Physical                            was performed, and patient medications and                            allergies were reviewed. The patient's tolerance of                            previous anesthesia was also reviewed. The risks                            and benefits of the procedure and the sedation                            options and risks were discussed with the patient.                            All questions were answered, and informed consent                            was obtained. Prior Anticoagulants: The patient has                            taken no anticoagulant or antiplatelet agents. ASA                            Grade Assessment: II - A patient with mild systemic  disease. After reviewing the risks and benefits,                            the patient was deemed in satisfactory condition to                            undergo the procedure.                           After obtaining informed consent, the colonoscope                            was passed under direct vision. Throughout the                             procedure, the patient's blood pressure, pulse, and                            oxygen saturations were monitored continuously. The                            Olympus CF-[id number] ([id number]) Colonoscope was                            introduced through the anus and advanced to the the                            cecum, identified by appendiceal orifice and                            ileocecal valve. The ileocecal valve, appendiceal                            orifice, and rectum were photographed. The quality                            of the bowel preparation was excellent. The                            colonoscopy was performed without difficulty. The                            patient tolerated the procedure well. The bowel                            preparation used was SUPREP via split dose                            instruction. Scope In: 10:16:27 AM Scope Out: 10:29:49 AM Scope Withdrawal Time: 0 hours 8 minutes 29 seconds  Total Procedure Duration: 0 hours 13 minutes 22 seconds  Findings:                 Multiple diverticula were found in the sigmoid  colon.                           Internal hemorrhoids were found during retroflexion.                           The exam was otherwise without abnormality on                            direct and retroflexion views.                           Biopsies for histology were taken with a cold                            forceps from the entire colon for evaluation of                            microscopic colitis. Complications:            No immediate complications. Estimated blood loss:                            None. Estimated Blood Loss:     Estimated blood loss: none. Impression:               - Diverticulosis in the sigmoid colon.                           - Internal hemorrhoids.                           - The examination was otherwise normal on direct                            and retroflexion views.                            - Biopsies were taken with a cold forceps from the                            entire colon for evaluation of microscopic colitis. Recommendation:           - Repeat colonoscopy is not recommended for                            surveillance.                           - Patient has a contact number available for                            emergencies. The signs and symptoms of potential                            delayed complications were discussed with the  patient. Return to normal activities tomorrow.                            Written discharge instructions were provided to the                            patient.                           - Resume previous diet.                           - Continue present medications.                           - Await pathology results. Wilhemina Bonito. Marina Goodell, MD 02/13/2023 10:35:32 AM This report has been signed electronically.

## 2023-02-13 NOTE — Progress Notes (Signed)
Called to room to assist during endoscopic procedure.  Patient ID and intended procedure confirmed with present staff. Received instructions for my participation in the procedure from the performing physician.  

## 2023-02-13 NOTE — Patient Instructions (Signed)
Discharge instructions given. Handouts on Diverticulosis and Hemorrhoids. Resume previous medications. YOU HAD AN ENDOSCOPIC PROCEDURE TODAY AT THE [facility]:   Refer to the procedure report that was given to you for any specific questions about what was found during the examination.  If the procedure report does not answer your questions, please call your gastroenterologist to clarify.  If you requested that your care partner not be given the details of your procedure findings, then the procedure report has been included in a sealed envelope for you to review at your convenience later.  YOU SHOULD EXPECT: Some feelings of bloating in the abdomen. Passage of more gas than usual.  Walking can help get rid of the air that was put into your GI tract during the procedure and reduce the bloating. If you had a lower endoscopy (such as a colonoscopy or flexible sigmoidoscopy) you may notice spotting of blood in your stool or on the toilet paper. If you underwent a bowel prep for your procedure, you may not have a normal bowel movement for a few days.  Please Note:  You might notice some irritation and congestion in your nose or some drainage.  This is from the oxygen used during your procedure.  There is no need for concern and it should clear up in a day or so.  SYMPTOMS TO REPORT IMMEDIATELY:  Following lower endoscopy (colonoscopy or flexible sigmoidoscopy):  Excessive amounts of blood in the stool  Significant tenderness or worsening of abdominal pains  Swelling of the abdomen that is new, acute  Fever of 100F or higher  For urgent or emergent issues, a gastroenterologist can be reached at any hour by calling [phone]. Do not use MyChart messaging for urgent concerns.    DIET:  We do recommend a small meal at first, but then you may proceed to your regular diet.  Drink plenty of fluids but you should avoid alcoholic beverages for 24 hours.  ACTIVITY:  You should plan to take  it easy for the rest of today and you should NOT DRIVE or use heavy machinery until tomorrow (because of the sedation medicines used during the test).    FOLLOW UP: Our staff will call the number listed on your records the next business day following your procedure.  We will call around 7:15- 8:00 am to check on you and address any questions or concerns that you may have regarding the information given to you following your procedure. If we do not reach you, we will leave a message.     If any biopsies were taken you will be contacted by phone or by letter within the next 1-3 weeks.  Please call us at [phone] if you have not heard about the biopsies in 3 weeks.    SIGNATURES/CONFIDENTIALITY: You and/or your care partner have signed paperwork which will be entered into your electronic medical record.  These signatures attest to the fact that that the information above on your After Visit Summary has been reviewed and is understood.  Full responsibility of the confidentiality of this discharge information lies with you and/or your care-partner.  

## 2023-02-13 NOTE — Progress Notes (Signed)
Sedate, gd SR, tolerated procedure well, VSS, report to RN 

## 2023-02-15 ENCOUNTER — Other Ambulatory Visit: Payer: Self-pay | Admitting: Family Medicine

## 2023-02-16 ENCOUNTER — Telehealth: Payer: Self-pay

## 2023-02-16 NOTE — Telephone Encounter (Signed)
  Follow up Call-     02/13/2023    8:55 AM  Call back number  Post procedure Call Back phone  # [phone]  Permission to leave phone message Yes     Patient questions:  Do you have a fever, pain , or abdominal swelling? No. Pain Score  0 *  Have you tolerated food without any problems? Yes.    Have you been able to return to your normal activities? Yes.    Do you have any questions about your discharge instructions: Diet   No. Medications  No. Follow up visit  No.  Do you have questions or concerns about your Care? No.  Actions: * If pain score is 4 or above: No action needed, pain <4.

## 2023-02-17 LAB — SURGICAL PATHOLOGY

## 2023-02-18 ENCOUNTER — Encounter: Payer: Self-pay | Admitting: Internal Medicine

## 2023-02-20 DIAGNOSIS — H353221 Exudative age-related macular degeneration, left eye, with active choroidal neovascularization: Secondary | ICD-10-CM | POA: Diagnosis not present

## 2023-02-22 ENCOUNTER — Other Ambulatory Visit: Payer: Self-pay | Admitting: Family Medicine

## 2023-02-22 DIAGNOSIS — E1165 Type 2 diabetes mellitus with hyperglycemia: Secondary | ICD-10-CM

## 2023-04-17 DIAGNOSIS — H353221 Exudative age-related macular degeneration, left eye, with active choroidal neovascularization: Secondary | ICD-10-CM | POA: Diagnosis not present

## 2023-04-21 ENCOUNTER — Other Ambulatory Visit: Payer: Self-pay | Admitting: Family Medicine

## 2023-04-21 DIAGNOSIS — E1165 Type 2 diabetes mellitus with hyperglycemia: Secondary | ICD-10-CM

## 2023-04-21 DIAGNOSIS — E782 Mixed hyperlipidemia: Secondary | ICD-10-CM

## 2023-04-21 DIAGNOSIS — J441 Chronic obstructive pulmonary disease with (acute) exacerbation: Secondary | ICD-10-CM

## 2023-04-21 DIAGNOSIS — Z8709 Personal history of other diseases of the respiratory system: Secondary | ICD-10-CM

## 2023-04-28 ENCOUNTER — Other Ambulatory Visit: Payer: Self-pay | Admitting: Family Medicine

## 2023-04-28 DIAGNOSIS — E1165 Type 2 diabetes mellitus with hyperglycemia: Secondary | ICD-10-CM

## 2023-04-28 DIAGNOSIS — E782 Mixed hyperlipidemia: Secondary | ICD-10-CM

## 2023-04-29 NOTE — Telephone Encounter (Signed)
 Requested medication (s) are due for refill today: yes  Requested medication (s) are on the active medication list: yes  Last refill:  10/22/22 #90/1  Future visit scheduled: no  Notes to clinic:  Unable to refill per protocol due to failed labs, no updated results. Pt was suppose to have 3 month FU but no appt since 07/2022.     Requested Prescriptions  Pending Prescriptions Disp Refills   pioglitazone (ACTOS) 30 MG tablet [Pharmacy Med Name: PIOGLITAZONE 30MG  TABLETS] 90 tablet 1    Sig: TAKE 1 TABLET(30 MG) BY MOUTH DAILY     Endocrinology:  Diabetes - Glitazones - pioglitazone Failed - 04/29/2023  9:46 AM      Failed - HBA1C is between 0 and 7.9 and within 180 days    Hgb A1c MFr Bld  Date Value Ref Range Status  08/08/2022 9.0 (H) <5.7 % of total Hgb Final    Comment:    For someone without known diabetes, a hemoglobin A1c value of 6.5% or greater indicates that they may have  diabetes and this should be confirmed with a follow-up  test. . For someone with known diabetes, a value <7% indicates  that their diabetes is well controlled and a value  greater than or equal to 7% indicates suboptimal  control. A1c targets should be individualized based on  duration of diabetes, age, comorbid conditions, and  other considerations. . Currently, no consensus exists regarding use of hemoglobin A1c for diagnosis of diabetes for children. .          Failed - Valid encounter within last 6 months    Recent Outpatient Visits           2 years ago Type 2 diabetes mellitus with hyperglycemia, without long-term current use of insulin (HCC)   [facility] [place], Priscille Heidelberg, MD   3 years ago COPD with acute exacerbation (HCC)   [facility] Donita Brooks, MD   4 years ago Type 2 diabetes mellitus with hyperglycemia, without long-term current use of insulin (HCC)   [facility] [place], Velna Hatchet, MD   5 years ago COPD with acute  exacerbation (HCC)   [facility] Danelle Berry, PA-C   5 years ago Controlled type 2 diabetes mellitus with complication, without long-term current use of insulin (HCC)   [facility] [place], Priscille Heidelberg, MD

## 2023-06-12 DIAGNOSIS — H353221 Exudative age-related macular degeneration, left eye, with active choroidal neovascularization: Secondary | ICD-10-CM | POA: Diagnosis not present

## 2023-08-11 ENCOUNTER — Ambulatory Visit: Admitting: Family Medicine

## 2023-09-04 DIAGNOSIS — H353221 Exudative age-related macular degeneration, left eye, with active choroidal neovascularization: Secondary | ICD-10-CM | POA: Diagnosis not present

## 2023-09-14 ENCOUNTER — Other Ambulatory Visit: Payer: Self-pay | Admitting: Family Medicine

## 2023-09-14 ENCOUNTER — Telehealth: Payer: Self-pay

## 2023-09-14 ENCOUNTER — Other Ambulatory Visit: Payer: Self-pay

## 2023-09-14 DIAGNOSIS — E1165 Type 2 diabetes mellitus with hyperglycemia: Secondary | ICD-10-CM

## 2023-09-14 DIAGNOSIS — E782 Mixed hyperlipidemia: Secondary | ICD-10-CM

## 2023-09-14 MED ORDER — METFORMIN HCL 500 MG PO TABS: ORAL_TABLET | ORAL | 0 refills | Status: DC

## 2023-09-14 NOTE — Telephone Encounter (Signed)
 Copied from CRM [id number]. Topic: Clinical - Medication Question >> Sep 14, 2023  2:06 PM Avram MATSU wrote: Reason for CRM: metFORMIN  (GLUCOPHAGE ) 500 MG tablet [[id number]] was denied and the pt was wondering why and for someone to call her back.

## 2023-09-24 ENCOUNTER — Encounter: Payer: Self-pay | Admitting: Family Medicine

## 2023-09-24 ENCOUNTER — Ambulatory Visit: Admitting: Family Medicine

## 2023-09-24 VITALS — BP 140/86 | HR 60 | Temp 98.1°F | Ht 65.0 in | Wt 165.0 lb

## 2023-09-24 DIAGNOSIS — E1165 Type 2 diabetes mellitus with hyperglycemia: Secondary | ICD-10-CM | POA: Diagnosis not present

## 2023-09-24 DIAGNOSIS — Z7984 Long term (current) use of oral hypoglycemic drugs: Secondary | ICD-10-CM

## 2023-09-24 DIAGNOSIS — Z1231 Encounter for screening mammogram for malignant neoplasm of breast: Secondary | ICD-10-CM

## 2023-09-24 DIAGNOSIS — Z122 Encounter for screening for malignant neoplasm of respiratory organs: Secondary | ICD-10-CM

## 2023-09-24 NOTE — Progress Notes (Signed)
 Subjective:    Patient ID: Kathleen Ramirez, female    DOB: [birth year], 78 y.o.   MRN: [id number]   Patient has not been seen in almost a year.  She has a history of diabetes that is poorly controlled.  She is supposed to be on glipizide , Januvia , pioglitazone , and metformin .  She stopped the metformin  due to diarrhea.  She is inconsistently taking her medication however she states that she has been taking the Januvia  and pioglitazone  and glipizide  relatively consistently.  She is not checking her blood sugar.  She denies any dysuria or urgency or frequency.  She denies any weight loss.  She denies any chest pain or shortness of breath or dyspnea on exertion.  Unfortunately she continues to smoke.  She has no desire to quit.  She is overdue for a mammogram as well as lung cancer screening. Past Medical History:  Diagnosis Date   Aortic stenosis 09/02/2009   Mild   Asthma    Bilateral ovarian tumors    Breast fibroadenoma    Cataract    Cervical cancer (HCC)    COPD (chronic obstructive pulmonary disease) (HCC)    DDD (degenerative disc disease), cervical    DDD (degenerative disc disease), lumbar    Diabetes mellitus without complication (HCC)    Diverticulosis, sigmoid 12/05/2015   Noted on Colonoscopy   Dysplastic colon polyp    Forearm fracture 10/18/2011   Right   Frequent urination    Ganglion cyst of wrist, right    pt unaware   History of colon polyps    HLD (hyperlipidemia)    Internal hemorrhoids 12/05/2015   Noted on Colonoscopy   Macular degeneration    Polycythemia    PONV (postoperative nausea and vomiting)    Smoking    Tumors    5tumors removed   UTI (lower urinary tract infection)    Yeast vaginitis    Past Surgical History:  Procedure Laterality Date   ABDOMINAL HYSTERECTOMY     APPENDECTOMY     BLADDER NECK SUSPENSION     BLADDER SURGERY     BREAST LUMPECTOMY     CATARACT EXTRACTION, BILATERAL     COLONOSCOPY W/ POLYPECTOMY  12/05/2015   oophrectomy      ORIF ULNAR FRACTURE  10/19/2011   Procedure: OPEN REDUCTION INTERNAL FIXATION (ORIF) ULNAR FRACTURE;  Surgeon: Prentice LELON Pagan, MD;  Location: [facility];  Service: Orthopedics;  Laterality: Right;   TONSILLECTOMY     TOTAL HIP ARTHROPLASTY Right 12/22/2017   Procedure: RIGHT TOTAL HIP ARTHROPLASTY ANTERIOR APPROACH;  Surgeon: Ernie Cough, MD;  Location: [facility];  Service: Orthopedics;  Laterality: Right;  70   TOTAL HIP ARTHROPLASTY Left 03/23/2018   Procedure: TOTAL HIP ARTHROPLASTY ANTERIOR APPROACH;  Surgeon: Ernie Cough, MD;  Location: [facility];  Service: Orthopedics;  Laterality: Left;  70 minutes   TUBAL LIGATION     TUMOR EXCISION     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   Current Outpatient Medications on File Prior to Visit  Medication Sig Dispense Refill   albuterol  (VENTOLIN  HFA) 108 (90 Base) MCG/ACT inhaler INHALE 2 PUFFS INTO THE LUNGS EVERY 4 HOURS AS NEEDED FOR WHEEZING OR SHORTNESS OF BREATH 8.5 g 1   atorvastatin  (LIPITOR) 20 MG tablet TAKE 1 TABLET(20 MG) BY MOUTH DAILY 90 tablet 3   budesonide -formoterol  (SYMBICORT ) 160-4.5 MCG/ACT inhaler INHALE 2 PUFFS INTO THE LUNGS TWICE DAILY 10.2 g 1   glipiZIDE  (GLUCOTROL  XL) 5 MG 24 hr tablet  TAKE 1 TABLET(5 MG) BY MOUTH DAILY WITH BREAKFAST 90 tablet 1   metFORMIN  (GLUCOPHAGE ) 500 MG tablet TAKE 1 TABLET(500 MG) BY MOUTH TWICE DAILY WITH A MEAL 60 tablet 0   pioglitazone  (ACTOS ) 30 MG tablet TAKE 1 TABLET(30 MG) BY MOUTH DAILY 90 tablet 1   sitaGLIPtin  (JANUVIA ) 100 MG tablet TAKE 1 TABLET(100 MG) BY MOUTH DAILY 90 tablet 1   clonazePAM  (KLONOPIN ) 0.5 MG tablet Take 1 tablet (0.5 mg total) by mouth 2 (two) times daily as needed for anxiety. (Patient not taking: Reported on 09/24/2023) 30 tablet 1   ondansetron  (ZOFRAN ) 4 MG tablet Take 1 tablet (4 mg total) by mouth every 8 (eight) hours as needed for nausea or vomiting. (Patient not taking: Reported on 09/24/2023) 4 tablet 0   Respiratory Therapy Supplies  (NEBULIZER/TUBING/MOUTHPIECE) KIT Disp one nebulizer machine, tubing set and mouthpiece kit (Patient not taking: Reported on 09/24/2023) 1 each 0   No current facility-administered medications on file prior to visit.   Allergies  Allergen Reactions   Codeine Nausea And Vomiting     makes heart race   Social History   Socioeconomic History   Marital status: Single    Spouse name: Not on file   Number of children: Not on file   Years of education: Not on file   Highest education level: Not on file  Occupational History   Not on file  Tobacco Use   Smoking status: Every Day    Current packs/day: 0.00    Types: Cigarettes    Last attempt to quit: 11/13/2017    Years since quitting: 5.8   Smokeless tobacco: Never  Vaping Use   Vaping status: Never Used  Substance and Sexual Activity   Alcohol use: Yes    Comment: rare   Drug use: No   Sexual activity: Not on file    Comment: single female partner  Other Topics Concern   Not on file  Social History Narrative   Not on file   Social Drivers of Health   Financial Resource Strain: Not on file  Food Insecurity: Not on file  Transportation Needs: Not on file  Physical Activity: Not on file  Stress: Not on file  Social Connections: Not on file  Intimate Partner Violence: Not on file     Review of Systems  All other systems reviewed and are negative.      Objective:   Physical Exam Vitals reviewed.  Constitutional:      Appearance: She is well-developed.  HENT:     Right Ear: Tympanic membrane and ear canal normal.     Left Ear: Tympanic membrane and ear canal normal.  Neck:     Vascular: No carotid bruit.  Cardiovascular:     Rate and Rhythm: Normal rate and regular rhythm.     Heart sounds: Normal heart sounds.  Pulmonary:     Effort: Pulmonary effort is normal. No respiratory distress.     Breath sounds: Normal breath sounds. Decreased air movement present. No wheezing, rhonchi or rales.  Abdominal:      General: Abdomen is flat. Bowel sounds are normal.     Palpations: Abdomen is soft.  Musculoskeletal:     Cervical back: Neck supple. No tenderness.     Right lower leg: No edema.     Left lower leg: No edema.  Lymphadenopathy:     Cervical: No cervical adenopathy.  Skin:    Findings: No rash.  Neurological:     General:  No focal deficit present.     Mental Status: She is oriented to person, place, and time.     Cranial Nerves: No cranial nerve deficit.     Sensory: No sensory deficit.     Motor: No weakness.     Gait: Gait normal.     Deep Tendon Reflexes: Reflexes normal.           Assessment & Plan:  Type 2 diabetes mellitus with hyperglycemia, without long-term current use of insulin  (HCC) - Plan: Hemoglobin A1c, CBC with Differential/Platelet, Comprehensive metabolic panel with GFR, Lipid panel, Microalbumin/Creatinine Ratio, Urine  Encounter for screening mammogram for malignant neoplasm of breast - Plan: MM 3D SCREENING MAMMOGRAM BILATERAL BREAST  Screening for lung cancer - Plan: CT CHEST LUNG CA SCREEN LOW DOSE W/O CM Strongly recommend smoking cessation but the patient has no desire to quit.  Schedule the patient for a CT scan of the lungs to screen for lung cancer.  Schedule her for mammogram screening for breast cancer.  Check CBC CMP lipid panel A1c and microalbumin to creatinine ratio.  Ideally I like to see the A1c less than 7.  Blood pressure today is acceptable at 140/86.  If A1c is elevated I would recommend adding Mounjaro.

## 2023-09-25 LAB — COMPREHENSIVE METABOLIC PANEL WITH GFR
AG Ratio: 2 (calc) (ref 1.0–2.5)
ALT: 13 U/L (ref 6–29)
AST: 14 U/L (ref 10–35)
Albumin: 4.5 g/dL (ref 3.6–5.1)
Alkaline phosphatase (APISO): 45 U/L (ref 37–153)
BUN: 20 mg/dL (ref 7–25)
CO2: 26 mmol/L (ref 20–32)
Calcium: 10 mg/dL (ref 8.6–10.4)
Chloride: 101 mmol/L (ref 98–110)
Creat: 0.98 mg/dL (ref 0.60–1.00)
Globulin: 2.3 g/dL (ref 1.9–3.7)
Glucose, Bld: 209 mg/dL — ABNORMAL HIGH (ref 65–99)
Potassium: 4.4 mmol/L (ref 3.5–5.3)
Sodium: 137 mmol/L (ref 135–146)
Total Bilirubin: 0.6 mg/dL (ref 0.2–1.2)
Total Protein: 6.8 g/dL (ref 6.1–8.1)
eGFR: 59 mL/min/1.73m2 — ABNORMAL LOW (ref >=60)

## 2023-09-25 LAB — CBC WITH DIFFERENTIAL/PLATELET
Absolute Lymphocytes: 2731 {cells}/uL (ref 850–3900)
Absolute Monocytes: 614 {cells}/uL (ref 200–950)
Basophils Absolute: 116 {cells}/uL (ref 0–200)
Basophils Relative: 1.4 %
Eosinophils Absolute: 249 {cells}/uL (ref 15–500)
Eosinophils Relative: 3 %
HCT: 49.2 % — ABNORMAL HIGH (ref 35.0–45.0)
Hemoglobin: 16.4 g/dL — ABNORMAL HIGH (ref 11.7–15.5)
MCH: 30 pg (ref 27.0–33.0)
MCHC: 33.3 g/dL (ref 32.0–36.0)
MCV: 90.1 fL (ref 80.0–100.0)
MPV: 11.5 fL (ref 7.5–12.5)
Monocytes Relative: 7.4 %
Neutro Abs: 4590 {cells}/uL (ref 1500–7800)
Neutrophils Relative %: 55.3 %
Platelets: 193 Thousand/uL (ref 140–400)
RBC: 5.46 Million/uL — ABNORMAL HIGH (ref 3.80–5.10)
RDW: 12.2 % (ref 11.0–15.0)
Total Lymphocyte: 32.9 %
WBC: 8.3 Thousand/uL (ref 3.8–10.8)

## 2023-09-25 LAB — HEMOGLOBIN A1C
Hgb A1c MFr Bld: 7.9 % — ABNORMAL HIGH (ref <5.7)
Mean Plasma Glucose: 180 mg/dL
eAG (mmol/L): 10 mmol/L

## 2023-09-25 LAB — LIPID PANEL
Cholesterol: 148 mg/dL (ref <200)
HDL: 53 mg/dL (ref >=50)
LDL Cholesterol (Calc): 72 mg/dL
Non-HDL Cholesterol (Calc): 95 mg/dL (ref <130)
Total CHOL/HDL Ratio: 2.8 (calc) (ref <5.0)
Triglycerides: 144 mg/dL (ref <150)

## 2023-09-25 LAB — MICROALBUMIN / CREATININE URINE RATIO
Creatinine, Urine: 13 mg/dL — ABNORMAL LOW (ref 20–275)
Microalb Creat Ratio: 46 mg/g{creat} — ABNORMAL HIGH (ref <30)
Microalb, Ur: 0.6 mg/dL

## 2023-09-27 ENCOUNTER — Ambulatory Visit: Payer: Self-pay | Admitting: Family Medicine

## 2023-09-30 ENCOUNTER — Other Ambulatory Visit: Payer: Self-pay

## 2023-09-30 DIAGNOSIS — E1165 Type 2 diabetes mellitus with hyperglycemia: Secondary | ICD-10-CM

## 2023-09-30 DIAGNOSIS — E782 Mixed hyperlipidemia: Secondary | ICD-10-CM

## 2023-09-30 MED ORDER — METFORMIN HCL 500 MG PO TABS
ORAL_TABLET | ORAL | 1 refills | Status: AC
Start: 2023-09-30 — End: ?

## 2023-10-08 ENCOUNTER — Encounter: Payer: Self-pay | Admitting: Family Medicine

## 2023-10-09 ENCOUNTER — Ambulatory Visit
Admission: RE | Admit: 2023-10-09 | Discharge: 2023-10-09 | Disposition: A | Discharge status: Home / Self Care | Source: Ambulatory Visit | Attending: Family Medicine | Admitting: Family Medicine

## 2023-10-09 DIAGNOSIS — Z122 Encounter for screening for malignant neoplasm of respiratory organs: Secondary | ICD-10-CM

## 2023-10-15 ENCOUNTER — Ambulatory Visit
Admission: RE | Admit: 2023-10-15 | Discharge: 2023-10-15 | Disposition: A | Discharge status: Home / Self Care | Source: Ambulatory Visit | Attending: Family Medicine | Admitting: Family Medicine

## 2023-10-15 DIAGNOSIS — Z1231 Encounter for screening mammogram for malignant neoplasm of breast: Secondary | ICD-10-CM

## 2023-10-20 ENCOUNTER — Other Ambulatory Visit: Payer: Self-pay | Admitting: Family Medicine

## 2023-10-20 DIAGNOSIS — E782 Mixed hyperlipidemia: Secondary | ICD-10-CM

## 2023-10-20 DIAGNOSIS — E1165 Type 2 diabetes mellitus with hyperglycemia: Secondary | ICD-10-CM

## 2023-10-21 NOTE — Telephone Encounter (Signed)
 Requested Prescriptions  Pending Prescriptions Disp Refills   glipiZIDE  (GLUCOTROL  XL) 5 MG 24 hr tablet [Pharmacy Med Name: GLIPIZIDE  ER 5MG  TABLETS] 90 tablet 0    Sig: TAKE 1 TABLET(5 MG) BY MOUTH DAILY WITH BREAKFAST     Endocrinology:  Diabetes - Sulfonylureas Passed - 10/21/2023  8:23 AM      Passed - HBA1C is between 0 and 7.9 and within 180 days    Hgb A1c MFr Bld  Date Value Ref Range Status  09/24/2023 7.9 (H) <5.7 % Final    Comment:    For someone without known diabetes, a hemoglobin A1c value of 6.5% or greater indicates that they may have  diabetes and this should be confirmed with a follow-up  test. . For someone with known diabetes, a value <7% indicates  that their diabetes is well controlled and a value  greater than or equal to 7% indicates suboptimal  control. A1c targets should be individualized based on  duration of diabetes, age, comorbid conditions, and  other considerations. . Currently, no consensus exists regarding use of hemoglobin A1c for diagnosis of diabetes for children. .          Passed - Cr in normal range and within 360 days    Creat  Date Value Ref Range Status  09/24/2023 0.98 0.60 - 1.00 mg/dL Final   Creatinine, Urine  Date Value Ref Range Status  09/24/2023 13 (L) 20 - 275 mg/dL Final         Passed - Valid encounter within last 6 months    Recent Outpatient Visits           3 weeks ago Type 2 diabetes mellitus with hyperglycemia, without long-term current use of insulin  [facility])   [place] [facility] Duanne Butler DASEN, MD   1 year ago Type 2 diabetes mellitus with hyperglycemia, without long-term current use of insulin  [facility])   [place] [facility] Duanne Butler DASEN, MD   2 years ago Type 2 diabetes mellitus with hyperglycemia, without long-term current use of insulin  [facility])   [place] [facility] Pickard, Butler DASEN, MD               atorvastatin  (LIPITOR) 20 MG  tablet [Pharmacy Med Name: ATORVASTATIN  20MG  TABLETS] 90 tablet 0    Sig: TAKE 1 TABLET(20 MG) BY MOUTH DAILY     Cardiovascular:  Antilipid - Statins Failed - 10/21/2023  8:23 AM      Failed - Lipid Panel in normal range within the last 12 months    Cholesterol  Date Value Ref Range Status  09/24/2023 148 <200 mg/dL Final   LDL Cholesterol (Calc)  Date Value Ref Range Status  09/24/2023 72 mg/dL (calc) Final    Comment:    Reference range: <100 . Desirable range <100 mg/dL for primary prevention;   <70 mg/dL for patients with CHD or diabetic patients  with > or = 2 CHD risk factors. SABRA LDL-C is now calculated using the Martin-Hopkins  calculation, which is a validated novel method providing  better accuracy than the Friedewald equation in the  estimation of LDL-C.  Gladis APPLETHWAITE et al. SANDREA. 7986;689(80): 2061-2068  (http://education.[employer].com/faq/FAQ164)    HDL  Date Value Ref Range Status  09/24/2023 53 > OR = 50 mg/dL Final   Triglycerides  Date Value Ref Range Status  09/24/2023 144 <150 mg/dL Final         Passed - Patient is  not pregnant      Passed - Valid encounter within last 12 months    Recent Outpatient Visits           3 weeks ago Type 2 diabetes mellitus with hyperglycemia, without long-term current use of insulin  [facility])   [place] [facility] Duanne Butler DASEN, MD   1 year ago Type 2 diabetes mellitus with hyperglycemia, without long-term current use of insulin  [facility])   [place] [facility] Duanne Butler DASEN, MD   2 years ago Type 2 diabetes mellitus with hyperglycemia, without long-term current use of insulin  [facility])   [place] [facility] Pickard, Butler DASEN, MD

## 2023-10-27 ENCOUNTER — Other Ambulatory Visit: Payer: Self-pay

## 2023-10-27 DIAGNOSIS — N63 Unspecified lump in unspecified breast: Secondary | ICD-10-CM

## 2023-10-28 ENCOUNTER — Encounter: Payer: Self-pay | Admitting: Family Medicine

## 2023-10-28 ENCOUNTER — Telehealth: Payer: Self-pay

## 2023-10-28 ENCOUNTER — Other Ambulatory Visit: Payer: Self-pay | Admitting: Family Medicine

## 2023-10-28 DIAGNOSIS — N63 Unspecified lump in unspecified breast: Secondary | ICD-10-CM

## 2023-10-28 NOTE — Telephone Encounter (Signed)
 Copied from CRM [id number]. Topic: Clinical - Lab/Test Results >> Oct 28, 2023 11:49 AM Delon DASEN wrote: Reason for CRM: need to speak with Ronal Bradley about the test for breast nodule- please call [phone]

## 2023-10-29 ENCOUNTER — Telehealth: Payer: Self-pay

## 2023-10-29 NOTE — Telephone Encounter (Signed)
 Copied from CRM [id number]. Topic: Clinical - Lab/Test Results >> Oct 28, 2023 11:49 AM Delon DASEN wrote: Reason for CRM: need to speak with Ronal Bradley about the test for breast nodule- please call [phone] >> Oct 28, 2023  4:35 PM Zebedee SAUNDERS wrote: Pt called needs to speak with Ronal Bradley about the test for breast nodule in detail, please call [phone].

## 2023-10-30 DIAGNOSIS — H353221 Exudative age-related macular degeneration, left eye, with active choroidal neovascularization: Secondary | ICD-10-CM | POA: Diagnosis not present

## 2023-11-04 ENCOUNTER — Other Ambulatory Visit: Payer: Self-pay | Admitting: Family Medicine

## 2023-11-04 DIAGNOSIS — E1165 Type 2 diabetes mellitus with hyperglycemia: Secondary | ICD-10-CM

## 2023-11-04 DIAGNOSIS — E782 Mixed hyperlipidemia: Secondary | ICD-10-CM

## 2023-11-11 ENCOUNTER — Other Ambulatory Visit

## 2023-11-11 ENCOUNTER — Encounter

## 2023-11-16 ENCOUNTER — Other Ambulatory Visit: Payer: Self-pay | Admitting: Family Medicine

## 2023-11-17 ENCOUNTER — Telehealth: Payer: Self-pay

## 2023-11-17 ENCOUNTER — Other Ambulatory Visit: Payer: Self-pay

## 2023-11-17 DIAGNOSIS — E1165 Type 2 diabetes mellitus with hyperglycemia: Secondary | ICD-10-CM

## 2023-11-17 MED ORDER — SITAGLIPTIN PHOSPHATE 100 MG PO TABS: ORAL_TABLET | ORAL | 2 refills | Status: AC

## 2023-11-17 NOTE — Telephone Encounter (Signed)
 Copied from CRM #[id number]. Topic: General - Other >> Nov 17, 2023  9:24 AM Joesph NOVAK wrote: Reason for CRM: Patient would like to speak with Ronal Bradley. She is upset about her prescription.

## 2023-12-15 ENCOUNTER — Encounter: Admitting: Family Medicine

## 2023-12-22 ENCOUNTER — Encounter: Admitting: Family Medicine

## 2023-12-28 ENCOUNTER — Ambulatory Visit (INDEPENDENT_AMBULATORY_CARE_PROVIDER_SITE_OTHER): Admitting: Family Medicine

## 2023-12-28 ENCOUNTER — Encounter: Payer: Self-pay | Admitting: Family Medicine

## 2023-12-28 VITALS — BP 122/74 | HR 64 | Temp 97.9°F | Ht 65.0 in | Wt 163.8 lb

## 2023-12-28 DIAGNOSIS — F17200 Nicotine dependence, unspecified, uncomplicated: Secondary | ICD-10-CM

## 2023-12-28 DIAGNOSIS — Z0000 Encounter for general adult medical examination without abnormal findings: Secondary | ICD-10-CM

## 2023-12-28 DIAGNOSIS — Z0001 Encounter for general adult medical examination with abnormal findings: Secondary | ICD-10-CM | POA: Diagnosis not present

## 2023-12-28 DIAGNOSIS — J449 Chronic obstructive pulmonary disease, unspecified: Secondary | ICD-10-CM

## 2023-12-28 DIAGNOSIS — E1165 Type 2 diabetes mellitus with hyperglycemia: Secondary | ICD-10-CM

## 2023-12-28 NOTE — Progress Notes (Signed)
 Subjective:    Patient ID: Kathleen Ramirez, female    DOB: 10/24/1945, 78 y.o.   MRN: [id number]  Patient is here today for complete physical exam.  She has had the pneumonia vaccine.  She declines a flu shot, shingles vaccine, a COVID shot, and the RSV vaccine.  She had a mammogram in August that was negative.  She had a CT scan of the lungs in August that showed no evidence of lung cancer.  She does not require Pap smear.  Her colonoscopy was performed last year.  Therefore she does not require another colonoscopy.  Her last bone density test was excellent in 2019.  Her blood pressure today is excellent at 122/74.  However in August, her A1c was elevated at 7.9.  Unfortunately she continues to smoke.  These are my 2 biggest concerns for her and we discussed them at length today. Past Medical History:  Diagnosis Date   Aortic stenosis 09/02/2009   Mild   Asthma    Bilateral ovarian tumors    Breast fibroadenoma    Cataract    Cervical cancer (HCC)    COPD (chronic obstructive pulmonary disease) (HCC)    DDD (degenerative disc disease), cervical    DDD (degenerative disc disease), lumbar    Diabetes mellitus without complication (HCC)    Diverticulosis, sigmoid 12/05/2015   Noted on Colonoscopy   Dysplastic colon polyp    Forearm fracture 10/18/2011   Right   Frequent urination    Ganglion cyst of wrist, right    pt unaware   History of colon polyps    HLD (hyperlipidemia)    Internal hemorrhoids 12/05/2015   Noted on Colonoscopy   Macular degeneration    Polycythemia    PONV (postoperative nausea and vomiting)    Smoking    Tumors    5tumors removed   UTI (lower urinary tract infection)    Yeast vaginitis    Past Surgical History:  Procedure Laterality Date   ABDOMINAL HYSTERECTOMY     APPENDECTOMY     BLADDER NECK SUSPENSION     BLADDER SURGERY     BREAST LUMPECTOMY     CATARACT EXTRACTION, BILATERAL     COLONOSCOPY W/ POLYPECTOMY  12/05/2015   oophrectomy     ORIF  ULNAR FRACTURE  10/19/2011   Procedure: OPEN REDUCTION INTERNAL FIXATION (ORIF) ULNAR FRACTURE;  Surgeon: Prentice LELON Pagan, MD;  Location: [facility];  Service: Orthopedics;  Laterality: Right;   TONSILLECTOMY     TOTAL HIP ARTHROPLASTY Right 12/22/2017   Procedure: RIGHT TOTAL HIP ARTHROPLASTY ANTERIOR APPROACH;  Surgeon: Ernie Cough, MD;  Location: [facility];  Service: Orthopedics;  Laterality: Right;  70   TOTAL HIP ARTHROPLASTY Left 03/23/2018   Procedure: TOTAL HIP ARTHROPLASTY ANTERIOR APPROACH;  Surgeon: Ernie Cough, MD;  Location: [facility];  Service: Orthopedics;  Laterality: Left;  70 minutes   TUBAL LIGATION     TUMOR EXCISION     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   Current Outpatient Medications on File Prior to Visit  Medication Sig Dispense Refill   albuterol  (VENTOLIN  HFA) 108 (90 Base) MCG/ACT inhaler INHALE 2 PUFFS INTO THE LUNGS EVERY 4 HOURS AS NEEDED FOR WHEEZING OR SHORTNESS OF BREATH 8.5 g 1   atorvastatin  (LIPITOR) 20 MG tablet TAKE 1 TABLET(20 MG) BY MOUTH DAILY 90 tablet 0   glipiZIDE  (GLUCOTROL  XL) 5 MG 24 hr tablet TAKE 1 TABLET(5 MG) BY MOUTH DAILY WITH BREAKFAST 90 tablet 0  metFORMIN  (GLUCOPHAGE ) 500 MG tablet TAKE 1 TABLET(500 MG) BY MOUTH TWICE DAILY WITH A MEAL 180 tablet 1   pioglitazone  (ACTOS ) 30 MG tablet TAKE 1 TABLET(30 MG) BY MOUTH DAILY 90 tablet 1   sitaGLIPtin  (JANUVIA ) 100 MG tablet TAKE 1 TABLET(100 MG) BY MOUTH DAILY 90 tablet 2   budesonide -formoterol  (SYMBICORT ) 160-4.5 MCG/ACT inhaler INHALE 2 PUFFS INTO THE LUNGS TWICE DAILY (Patient not taking: Reported on 12/28/2023) 10.2 g 1   clonazePAM  (KLONOPIN ) 0.5 MG tablet Take 1 tablet (0.5 mg total) by mouth 2 (two) times daily as needed for anxiety. (Patient not taking: Reported on 12/28/2023) 30 tablet 1   ondansetron  (ZOFRAN ) 4 MG tablet Take 1 tablet (4 mg total) by mouth every 8 (eight) hours as needed for nausea or vomiting. (Patient not taking: Reported on 12/28/2023) 4 tablet 0   Respiratory  Therapy Supplies (NEBULIZER/TUBING/MOUTHPIECE) KIT Disp one nebulizer machine, tubing set and mouthpiece kit (Patient not taking: Reported on 12/28/2023) 1 each 0   No current facility-administered medications on file prior to visit.   Allergies  Allergen Reactions   Codeine Nausea And Vomiting     makes heart race   Social History   Socioeconomic History   Marital status: Single    Spouse name: Not on file   Number of children: Not on file   Years of education: Not on file   Highest education level: Not on file  Occupational History   Not on file  Tobacco Use   Smoking status: Every Day    Current packs/day: 0.00    Types: Cigarettes    Last attempt to quit: 11/13/2017    Years since quitting: 6.1   Smokeless tobacco: Never  Vaping Use   Vaping status: Never Used  Substance and Sexual Activity   Alcohol use: Yes    Comment: rare   Drug use: No   Sexual activity: Not on file    Comment: single female partner  Other Topics Concern   Not on file  Social History Narrative   Not on file   Social Drivers of Health   Financial Resource Strain: Not on file  Food Insecurity: Not on file  Transportation Needs: Not on file  Physical Activity: Not on file  Stress: Not on file  Social Connections: Not on file  Intimate Partner Violence: Not on file     Review of Systems  All other systems reviewed and are negative.      Objective:   Physical Exam Vitals reviewed.  Constitutional:      Appearance: She is well-developed.  HENT:     Right Ear: Tympanic membrane and ear canal normal.     Left Ear: Tympanic membrane and ear canal normal.  Neck:     Vascular: No carotid bruit.  Cardiovascular:     Rate and Rhythm: Normal rate and regular rhythm.     Heart sounds: Normal heart sounds.  Pulmonary:     Effort: Pulmonary effort is normal. No respiratory distress.     Breath sounds: Normal breath sounds. Decreased air movement present. No wheezing, rhonchi or rales.   Abdominal:     General: Abdomen is flat. Bowel sounds are normal.     Palpations: Abdomen is soft.  Musculoskeletal:     Cervical back: Neck supple. No tenderness.     Right lower leg: No edema.     Left lower leg: No edema.  Lymphadenopathy:     Cervical: No cervical adenopathy.  Skin:  Findings: No rash.  Neurological:     General: No focal deficit present.     Mental Status: She is oriented to person, place, and time.     Cranial Nerves: No cranial nerve deficit.     Sensory: No sensory deficit.     Motor: No weakness.     Gait: Gait normal.     Deep Tendon Reflexes: Reflexes normal.           Assessment & Plan:  Type 2 diabetes mellitus with hyperglycemia, without long-term current use of insulin  (HCC) - Plan: CBC with Differential/Platelet, Comprehensive metabolic panel with GFR, Lipid panel, Hemoglobin A1c, Microalbumin / creatinine urine ratio  Chronic obstructive pulmonary disease, unspecified COPD type (HCC)  General medical exam  Smoking addiction Continue to encourage the patient to quit smoking.  This is my biggest concern for her long-term health.  Recommended a flu shot, shingles shot, a COVID shot, and an RSV vaccine.  Patient declines them all today.  Blood pressure is excellent.  I will check a CBC a CMP and lipid panel and A1c and a urine microalbumin to creatinine ratio.  Goal A1c is less than 6.5.  I would like to see her microalbumin to creatinine ratio less than 30.  Recommended a repeat CT scan of the lungs in August 2026 along with a mammogram at that time.

## 2023-12-29 ENCOUNTER — Other Ambulatory Visit: Payer: Self-pay

## 2023-12-29 ENCOUNTER — Ambulatory Visit: Payer: Self-pay | Admitting: Family Medicine

## 2023-12-29 DIAGNOSIS — E1165 Type 2 diabetes mellitus with hyperglycemia: Secondary | ICD-10-CM

## 2023-12-29 LAB — LIPID PANEL
Cholesterol: 145 mg/dL (ref <200)
HDL: 68 mg/dL (ref >=50)
LDL Cholesterol (Calc): 56 mg/dL
Non-HDL Cholesterol (Calc): 77 mg/dL (ref <130)
Total CHOL/HDL Ratio: 2.1 (calc) (ref <5.0)
Triglycerides: 131 mg/dL (ref <150)

## 2023-12-29 LAB — COMPREHENSIVE METABOLIC PANEL WITH GFR
AG Ratio: 1.8 (calc) (ref 1.0–2.5)
ALT: 12 U/L (ref 6–29)
AST: 16 U/L (ref 10–35)
Albumin: 4.6 g/dL (ref 3.6–5.1)
Alkaline phosphatase (APISO): 38 U/L (ref 37–153)
BUN/Creatinine Ratio: 29 (calc) — ABNORMAL HIGH (ref 6–22)
BUN: 27 mg/dL — ABNORMAL HIGH (ref 7–25)
CO2: 27 mmol/L (ref 20–32)
Calcium: 9.8 mg/dL (ref 8.6–10.4)
Chloride: 103 mmol/L (ref 98–110)
Creat: 0.94 mg/dL (ref 0.60–1.00)
Globulin: 2.6 g/dL (ref 1.9–3.7)
Glucose, Bld: 144 mg/dL — ABNORMAL HIGH (ref 65–99)
Potassium: 4.7 mmol/L (ref 3.5–5.3)
Sodium: 139 mmol/L (ref 135–146)
Total Bilirubin: 0.6 mg/dL (ref 0.2–1.2)
Total Protein: 7.2 g/dL (ref 6.1–8.1)
eGFR: 62 mL/min/1.73m2 (ref >=60)

## 2023-12-29 LAB — CBC WITH DIFFERENTIAL/PLATELET
Absolute Lymphocytes: 2579 {cells}/uL (ref 850–3900)
Absolute Monocytes: 622 {cells}/uL (ref 200–950)
Basophils Absolute: 109 {cells}/uL (ref 0–200)
Basophils Relative: 1.3 %
Eosinophils Absolute: 176 {cells}/uL (ref 15–500)
Eosinophils Relative: 2.1 %
HCT: 50.2 % — ABNORMAL HIGH (ref 35.0–45.0)
Hemoglobin: 16.6 g/dL — ABNORMAL HIGH (ref 11.7–15.5)
MCH: 29.6 pg (ref 27.0–33.0)
MCHC: 33.1 g/dL (ref 32.0–36.0)
MCV: 89.6 fL (ref 80.0–100.0)
MPV: 11.3 fL (ref 7.5–12.5)
Monocytes Relative: 7.4 %
Neutro Abs: 4914 {cells}/uL (ref 1500–7800)
Neutrophils Relative %: 58.5 %
Platelets: 190 Thousand/uL (ref 140–400)
RBC: 5.6 Million/uL — ABNORMAL HIGH (ref 3.80–5.10)
RDW: 12.3 % (ref 11.0–15.0)
Total Lymphocyte: 30.7 %
WBC: 8.4 Thousand/uL (ref 3.8–10.8)

## 2023-12-29 LAB — MICROALBUMIN / CREATININE URINE RATIO
Creatinine, Urine: 19 mg/dL — ABNORMAL LOW (ref 20–275)
Microalb Creat Ratio: 21 mg/g{creat} (ref <30)
Microalb, Ur: 0.4 mg/dL

## 2023-12-29 LAB — HEMOGLOBIN A1C
Hgb A1c MFr Bld: 7 % — ABNORMAL HIGH (ref <5.7)
Mean Plasma Glucose: 154 mg/dL
eAG (mmol/L): 8.5 mmol/L

## 2023-12-29 MED ORDER — LANCETS 30G MISC: 3 refills | Status: AC

## 2023-12-29 MED ORDER — LANCETS 30G MISC: 3 refills | Status: DC

## 2024-01-01 DIAGNOSIS — H353221 Exudative age-related macular degeneration, left eye, with active choroidal neovascularization: Secondary | ICD-10-CM | POA: Diagnosis not present

## 2024-01-22 ENCOUNTER — Encounter: Payer: Self-pay | Admitting: Family Medicine

## 2024-01-22 ENCOUNTER — Ambulatory Visit: Admitting: Family Medicine

## 2024-01-22 VITALS — BP 138/76 | HR 65 | Temp 97.5°F | Ht 65.0 in | Wt 163.2 lb

## 2024-01-22 DIAGNOSIS — J329 Chronic sinusitis, unspecified: Secondary | ICD-10-CM | POA: Diagnosis not present

## 2024-01-22 MED ORDER — AMOXICILLIN-POT CLAVULANATE 875-125 MG PO TABS: 1.0000 | ORAL_TABLET | Freq: Two times a day (BID) | ORAL | 0 refills | Status: DC

## 2024-01-22 MED ORDER — PREDNISONE 20 MG PO TABS: ORAL_TABLET | ORAL | 0 refills | Status: DC

## 2024-01-22 NOTE — Progress Notes (Signed)
 Subjective:    Patient ID: Kathleen Ramirez, female    DOB: 1945/10/20, 78 y.o.   MRN: [id number]  Patient reports a 2-week history of severe head congestion.  She reports some pressure and pain in her frontal sinuses bilaterally.  She reports postnasal drip causing a cough.  She reports wheezing and some shortness of breath.  Today on exam, the patient has diminished breath sounds in all 4 lung quadrants however I do not appreciate any wheezes crackles or rails.  She denies any fevers or chills.  She denies any purulent sputum.  She denies any hemoptysis. Past Medical History:  Diagnosis Date   Aortic stenosis 09/02/2009   Mild   Asthma    Bilateral ovarian tumors    Breast fibroadenoma    Cataract    Cervical cancer (HCC)    COPD (chronic obstructive pulmonary disease) (HCC)    DDD (degenerative disc disease), cervical    DDD (degenerative disc disease), lumbar    Diabetes mellitus without complication (HCC)    Diverticulosis, sigmoid 12/05/2015   Noted on Colonoscopy   Dysplastic colon polyp    Forearm fracture 10/18/2011   Right   Frequent urination    Ganglion cyst of wrist, right    pt unaware   History of colon polyps    HLD (hyperlipidemia)    Internal hemorrhoids 12/05/2015   Noted on Colonoscopy   Macular degeneration    Polycythemia    PONV (postoperative nausea and vomiting)    Smoking    Tumors    5tumors removed   UTI (lower urinary tract infection)    Yeast vaginitis    Past Surgical History:  Procedure Laterality Date   ABDOMINAL HYSTERECTOMY     APPENDECTOMY     BLADDER NECK SUSPENSION     BLADDER SURGERY     BREAST LUMPECTOMY     CATARACT EXTRACTION, BILATERAL     COLONOSCOPY W/ POLYPECTOMY  12/05/2015   oophrectomy     ORIF ULNAR FRACTURE  10/19/2011   Procedure: OPEN REDUCTION INTERNAL FIXATION (ORIF) ULNAR FRACTURE;  Surgeon: Prentice LELON Pagan, MD;  Location: [facility];  Service: Orthopedics;  Laterality: Right;   TONSILLECTOMY     TOTAL HIP ARTHROPLASTY  Right 12/22/2017   Procedure: RIGHT TOTAL HIP ARTHROPLASTY ANTERIOR APPROACH;  Surgeon: Ernie Cough, MD;  Location: [facility];  Service: Orthopedics;  Laterality: Right;  70   TOTAL HIP ARTHROPLASTY Left 03/23/2018   Procedure: TOTAL HIP ARTHROPLASTY ANTERIOR APPROACH;  Surgeon: Ernie Cough, MD;  Location: [facility];  Service: Orthopedics;  Laterality: Left;  70 minutes   TUBAL LIGATION     TUMOR EXCISION     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 100 each 3   metFORMIN  (GLUCOPHAGE ) 500 MG tablet TAKE 1 TABLET(500 MG) BY MOUTH TWICE DAILY WITH A MEAL 180 tablet 1   ondansetron  (ZOFRAN ) 4 MG tablet Take 1 tablet (4 mg total) by mouth every  8 (eight) hours as needed for nausea or vomiting. 4 tablet 0   pioglitazone  (ACTOS ) 30 MG tablet TAKE 1 TABLET(30 MG) BY MOUTH DAILY 90 tablet 1   sitaGLIPtin  (JANUVIA ) 100 MG tablet TAKE 1 TABLET(100 MG) BY MOUTH DAILY 90 tablet 2   budesonide -formoterol  (SYMBICORT ) 160-4.5 MCG/ACT inhaler INHALE 2 PUFFS INTO THE LUNGS TWICE DAILY 10.2 g 1   clonazePAM  (KLONOPIN ) 0.5 MG tablet Take 1 tablet (0.5 mg total) by mouth 2 (two) times daily as needed for anxiety. (Patient not taking: Reported on 01/22/2024) 30 tablet 1   Respiratory Therapy Supplies (NEBULIZER/TUBING/MOUTHPIECE) KIT Disp one nebulizer machine, tubing set and mouthpiece kit (Patient not taking: Reported on 01/22/2024) 1 each 0   No current facility-administered medications on file prior to visit.   Allergies   Allergen Reactions   Codeine Nausea And Vomiting     makes heart race   Social History   Socioeconomic History   Marital status: Single    Spouse name: Not on file   Number of children: Not on file   Years of education: Not on file   Highest education level: Not on file  Occupational History   Not on file  Tobacco Use   Smoking status: Every Day    Current packs/day: 0.00    Types: Cigarettes    Last attempt to quit: 11/13/2017    Years since quitting: 6.1   Smokeless tobacco: Never  Vaping Use   Vaping status: Never Used  Substance and Sexual Activity   Alcohol use: Yes    Comment: rare   Drug use: No   Sexual activity: Not on file    Comment: single female partner  Other Topics Concern   Not on file  Social History Narrative   Not on file   Social Drivers of Health   Financial Resource Strain: Not on file  Food Insecurity: Not on file  Transportation Needs: Not on file  Physical Activity: Not on file  Stress: Not on file  Social Connections: Not on file  Intimate Partner Violence: Not on file     Review of Systems  All other systems reviewed and are negative.      Objective:   Physical Exam Vitals reviewed.  Constitutional:      Appearance: She is well-developed.  HENT:     Right Ear: Tympanic membrane and ear canal normal.     Left Ear: Tympanic membrane and ear canal normal.     Nose: Congestion and rhinorrhea present.     Right Sinus: Frontal sinus tenderness present.     Left Sinus: Frontal sinus tenderness present.  Neck:     Vascular: No carotid bruit.  Cardiovascular:     Rate and Rhythm: Normal rate and regular rhythm.     Heart sounds: Normal heart sounds.  Pulmonary:     Effort: Pulmonary effort is normal. No respiratory distress.     Breath sounds: Normal breath sounds. Decreased air movement present. No wheezing, rhonchi or rales.  Abdominal:     General: Abdomen is flat. Bowel sounds are normal.     Palpations: Abdomen is soft.   Musculoskeletal:     Cervical back: Neck supple. No tenderness.     Right lower leg: No edema.     Left lower leg: No edema.  Lymphadenopathy:     Cervical: No cervical adenopathy.  Skin:    Findings: No rash.  Neurological:     General: No focal deficit present.  Mental Status: She is oriented to person, place, and time.     Cranial Nerves: No cranial nerve deficit.     Sensory: No sensory deficit.     Motor: No weakness.     Gait: Gait normal.     Deep Tendon Reflexes: Reflexes normal.           Assessment & Plan:  Rhinosinusitis I believe the patient has having a sinus infection.  Begin Augmentin  875 mg twice daily for 10 days.  Due to the severe reduction in airflow and the decreased breath sounds, I have recommended adding prednisone  taper pack in addition to the Augmentin  to help reduce any bronchospasm as well as decrease inflammation in the sinus passages

## 2024-01-24 ENCOUNTER — Other Ambulatory Visit: Payer: Self-pay | Admitting: Family Medicine

## 2024-01-24 DIAGNOSIS — E782 Mixed hyperlipidemia: Secondary | ICD-10-CM

## 2024-01-24 DIAGNOSIS — E1165 Type 2 diabetes mellitus with hyperglycemia: Secondary | ICD-10-CM

## 2024-01-30 ENCOUNTER — Other Ambulatory Visit: Payer: Self-pay | Admitting: Family Medicine

## 2024-01-30 DIAGNOSIS — E1165 Type 2 diabetes mellitus with hyperglycemia: Secondary | ICD-10-CM

## 2024-01-30 DIAGNOSIS — E782 Mixed hyperlipidemia: Secondary | ICD-10-CM

## 2024-02-16 ENCOUNTER — Other Ambulatory Visit: Payer: Self-pay

## 2024-02-16 ENCOUNTER — Telehealth: Payer: Self-pay

## 2024-02-16 DIAGNOSIS — E1165 Type 2 diabetes mellitus with hyperglycemia: Secondary | ICD-10-CM

## 2024-02-16 MED ORDER — BLOOD GLUCOSE TEST VI STRP: ORAL_STRIP | 3 refills | Status: AC

## 2024-02-16 NOTE — Telephone Encounter (Signed)
 Copied from CRM [id number]. Topic: General - Call Back - No Documentation >> Feb 16, 2024  8:13 AM Olam RAMAN wrote: Reason for CRM: Pt asked for Nurse mary to call her back Cb   [phone]

## 2024-02-17 ENCOUNTER — Ambulatory Visit (HOSPITAL_COMMUNITY)
Admission: EM | Admit: 2024-02-17 | Discharge: 2024-02-17 | Disposition: A | Discharge status: Home / Self Care | Attending: Student | Admitting: Student

## 2024-02-17 ENCOUNTER — Ambulatory Visit (INDEPENDENT_AMBULATORY_CARE_PROVIDER_SITE_OTHER)

## 2024-02-17 ENCOUNTER — Encounter (HOSPITAL_COMMUNITY): Payer: Self-pay | Admitting: *Deleted

## 2024-02-17 DIAGNOSIS — E1165 Type 2 diabetes mellitus with hyperglycemia: Secondary | ICD-10-CM | POA: Diagnosis not present

## 2024-02-17 DIAGNOSIS — Z7984 Long term (current) use of oral hypoglycemic drugs: Secondary | ICD-10-CM

## 2024-02-17 DIAGNOSIS — J441 Chronic obstructive pulmonary disease with (acute) exacerbation: Secondary | ICD-10-CM | POA: Diagnosis not present

## 2024-02-17 MED ORDER — IPRATROPIUM-ALBUTEROL 0.5-2.5 (3) MG/3ML IN SOLN
3.0000 mL | Freq: Once | RESPIRATORY_TRACT | Status: AC
  Administered 2024-02-17: 3 mL via RESPIRATORY_TRACT

## 2024-02-17 MED ORDER — PULSE OXIMETER FOR FINGER MISC: 1.0000 | Freq: Every day | 0 refills | Status: AC | PRN

## 2024-02-17 MED ORDER — PREDNISONE 20 MG PO TABS: 40.0000 mg | ORAL_TABLET | Freq: Every day | ORAL | 0 refills | Status: AC

## 2024-02-17 MED ORDER — IPRATROPIUM-ALBUTEROL 0.5-2.5 (3) MG/3ML IN SOLN
RESPIRATORY_TRACT | Status: AC
  Filled 2024-02-17: qty 3

## 2024-02-17 MED ORDER — AZITHROMYCIN 250 MG PO TABS: 250.0000 mg | ORAL_TABLET | Freq: Every day | ORAL | 0 refills | Status: DC

## 2024-02-17 NOTE — Discharge Instructions (Addendum)
-  Your chest xray looks okay today, so we are treating you for asthma and COPD (inflammation in lungs) -Z-pack antibiotic x5 days -Prednisone , 2 pills taken at the same time for 7 days in a row.  Try taking this earlier in the day as it can give you energy. Avoid NSAIDs like ibuprofen  and alleve while taking this medication as they can increase your risk of stomach upset and even GI bleeding when in combination with a steroid. You can continue tylenol  (acetaminophen ) up to 1000mg  3x daily. -Continue symbicort  inhaler, and Albuterol  inhaler as needed for cough, wheezing, shortness of breath, 1 to 2 puffs every 6 hours as needed. -I also sent a prescription for a pulse ox monitor. Use this 2-3x daily. Your oxygen should be >94%. If it is <94%, and you are feeling short of breath, and this is not improved by your inhalers, head to the ER or call 911 -Your cough should slowly get better instead of worse. If you develop a cough productive of dark or red sputum, new shortness of breath, new chest tightness, new fevers, etc - seek additional care.

## 2024-02-17 NOTE — ED Triage Notes (Signed)
 Pt states that 3-4 weeks ago her pcp treated her for sinus infection with steroids and antibiotics. She states her sx are back she is having some SOB, cough, congestion.   Pt states she has a neb at home which she doesn't use at all and she used albuterol  MDI last night.

## 2024-02-17 NOTE — ED Provider Notes (Signed)
 " [facility]    CSN: [id number] Arrival date & time: 02/17/24  0803      History   Chief Complaint Chief Complaint  Patient presents with   Cough   Nasal Congestion   Headache   Shortness of Breath    HPI Kathleen Ramirez is a 78 y.o. female presenting w COPD flare.  Pt states that 3-4 weeks ago her pcp treated her for sinus infection with steroids and antibiotics. She completed these as directed 3 weeks ago. She states her sx are back she is having some SOB, cough productive of white sputum, congestion x1 week. Next PCP appt 02/25/23.   She is an asthmatic, taking symbicort  inhaler bid, and albuterol  inhaler prn, which she states is minimally effective. Pt states she has a neb at home which she doesn't use at all and she used albuterol  MDI last night.    H/o asthma, COPD. Current every day smoker.   HPI  Past Medical History:  Diagnosis Date   Aortic stenosis 09/02/2009   Mild   Asthma    Bilateral ovarian tumors    Breast fibroadenoma    Cataract    Cervical cancer (HCC)    COPD (chronic obstructive pulmonary disease) (HCC)    DDD (degenerative disc disease), cervical    DDD (degenerative disc disease), lumbar    Diabetes mellitus without complication (HCC)    Diverticulosis, sigmoid 12/05/2015   Noted on Colonoscopy   Dysplastic colon polyp    Forearm fracture 10/18/2011   Right   Frequent urination    Ganglion cyst of wrist, right    pt unaware   History of colon polyps    HLD (hyperlipidemia)    Internal hemorrhoids 12/05/2015   Noted on Colonoscopy   Macular degeneration    Polycythemia    PONV (postoperative nausea and vomiting)    Smoking    Tumors    5tumors removed   UTI (lower urinary tract infection)    Yeast vaginitis     Patient Active Problem List   Diagnosis Date Noted   S/P left THA, 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   Overweight (BMI 25.0-29.9) 12/23/2017   S/P right THA, AA 12/22/2017   Aortic valve disorder 09/02/2009   FIBROADENOMA, BREAST  08/25/2009   Osteoarthrosis, 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   VAGINITIS, CANDIDAL 02/20/2009   Acute upper respiratory infection 02/20/2009   GANGLION CYST, WRIST, RIGHT 10/20/2008   Diabetes (HCC) 09/08/2008   HYPERLIPIDEMIA, MIXED 08/23/2008   POLYCYTHEMIA, SECONDARY 08/23/2008   TOBACCO ABUSE 08/11/2008   TROCHANTERIC BURSITIS, RIGHT 08/11/2008   History of colonic polyps 08/11/2008   Dermatophytosis of body 01/28/2008   DEPRESSION/ANXIETY 01/28/2008   Macular degeneration (senile) of retina 01/28/2008   Extrinsic asthma 01/28/2008   COPD (chronic obstructive pulmonary disease) (HCC) 01/28/2008   FREQUENCY, URINARY 01/28/2008   SHOULDER PAIN, LEFT 05/05/2003   CHEST PAIN 11/18/2002   Diverticulosis of colon 12/10/2001    Past Surgical History:  Procedure Laterality Date   ABDOMINAL HYSTERECTOMY     APPENDECTOMY     BLADDER NECK SUSPENSION     BLADDER SURGERY     BREAST LUMPECTOMY     CATARACT EXTRACTION, BILATERAL     COLONOSCOPY W/ POLYPECTOMY  12/05/2015   oophrectomy     ORIF ULNAR FRACTURE  10/19/2011   Procedure: OPEN REDUCTION INTERNAL FIXATION (ORIF) ULNAR FRACTURE;  Surgeon: Prentice LELON Pagan, MD;  Location: [facility];  Service: Orthopedics;  Laterality: Right;   TONSILLECTOMY     TOTAL HIP  ARTHROPLASTY Right 12/22/2017   Procedure: RIGHT TOTAL HIP ARTHROPLASTY ANTERIOR APPROACH;  Surgeon: Ernie Cough, MD;  Location: [facility];  Service: Orthopedics;  Laterality: Right;  70   TOTAL HIP ARTHROPLASTY Left 03/23/2018   Procedure: TOTAL HIP ARTHROPLASTY ANTERIOR APPROACH;  Surgeon: Ernie Cough, MD;  Location: [facility];  Service: Orthopedics;  Laterality: Left;  70 minutes   TUBAL LIGATION     TUMOR EXCISION     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      Home Medications    Prior to Admission medications  Medication Sig Start Date End Date Taking? Authorizing Provider  albuterol  (VENTOLIN  HFA) 108 (90 Base) MCG/ACT inhaler INHALE 2  PUFFS INTO THE LUNGS EVERY 4 HOURS AS NEEDED FOR WHEEZING OR SHORTNESS OF BREATH 04/22/23  Yes Duanne Butler DASEN, MD  atorvastatin  (LIPITOR) 20 MG tablet TAKE 1 TABLET(20 MG) BY MOUTH DAILY 01/25/24  Yes Duanne Butler DASEN, MD  azithromycin  (ZITHROMAX ) 250 MG tablet Take 1 tablet (250 mg total) by mouth daily. Take first 2 tablets together, then 1 every day until finished. 02/17/24  Yes Canaan Prue E, PA-C  budesonide -formoterol  (SYMBICORT ) 160-4.5 MCG/ACT inhaler INHALE 2 PUFFS INTO THE LUNGS TWICE DAILY 01/16/22  Yes Duanne Butler DASEN, MD  glipiZIDE  (GLUCOTROL  XL) 5 MG 24 hr tablet TAKE 1 TABLET(5 MG) BY MOUTH DAILY WITH BREAKFAST 01/25/24  Yes Duanne Butler DASEN, MD  metFORMIN  (GLUCOPHAGE ) 500 MG tablet TAKE 1 TABLET(500 MG) BY MOUTH TWICE DAILY WITH A MEAL 09/30/23  Yes Duanne Butler DASEN, MD  Misc. Devices (PULSE OXIMETER FOR FINGER) MISC 1 each by Does not apply route daily as needed. 02/17/24  Yes Arlyss Leita BRAVO, PA-C  pioglitazone  (ACTOS ) 30 MG tablet TAKE 1 TABLET(30 MG) BY MOUTH DAILY 11/04/23  Yes Duanne Butler DASEN, MD  predniSONE  (DELTASONE ) 20 MG tablet Take 2 tablets (40 mg total) by mouth daily for 7 days. Take with breakfast or lunch. Avoid NSAIDs (ibuprofen , etc) while taking this medication. 02/17/24 02/24/24 Yes Cadynce Garrette E, PA-C  sitaGLIPtin  (JANUVIA ) 100 MG tablet TAKE 1 TABLET(100 MG) BY MOUTH DAILY 11/17/23  Yes Duanne Butler DASEN, MD  amoxicillin -clavulanate (AUGMENTIN ) 875-125 MG tablet Take 1 tablet by mouth 2 (two) times daily. 01/22/24   Duanne Butler DASEN, MD  Glucose Blood (BLOOD GLUCOSE TEST STRIPS) STRP Use to check blood sugar, fasting, once per day.  May substitute to any manufacturer covered by patient's insurance. Patient has a Contour Next Gen Monitor at home. 02/16/24   Duanne Butler DASEN, MD  Lancets 30G MISC Use to check blood sugar fasting daily. 12/29/23   Duanne Butler DASEN, MD  ondansetron  (ZOFRAN ) 4 MG tablet Take 1 tablet (4 mg total) by mouth every 8 (eight) hours as  needed for nausea or vomiting. 01/09/23   Abran Norleen SAILOR, MD    Family History Family History  Problem Relation Age of Onset   Heart disease Mother    Colon cancer Neg Hx     Social History Social History[1]   Allergies   Codeine   Review of Systems Review of Systems  Constitutional:  Negative for appetite change, chills and fever.  HENT:  Positive for congestion. Negative for ear pain, rhinorrhea, sinus pressure, sinus pain and sore throat.   Eyes:  Negative for redness and visual disturbance.  Respiratory:  Positive for cough and shortness of breath. Negative for chest tightness and wheezing.   Cardiovascular:  Negative for  chest pain and palpitations.  Gastrointestinal:  Negative for abdominal pain, constipation, diarrhea, nausea and vomiting.  Genitourinary:  Negative for dysuria, frequency and urgency.  Musculoskeletal:  Negative for myalgias.  Neurological:  Negative for dizziness, weakness and headaches.  Psychiatric/Behavioral:  Negative for confusion.   All other systems reviewed and are negative.    Physical Exam Triage Vital Signs ED Triage Vitals  Encounter Vitals Group     BP 02/17/24 0826 (!) 156/82     Girls Systolic BP Percentile --      Girls Diastolic BP Percentile --      Boys Systolic BP Percentile --      Boys Diastolic BP Percentile --      Pulse Rate 02/17/24 0826 85     Resp 02/17/24 0826 (!) 24     Temp 02/17/24 0826 98.1 F (36.7 C)     Temp Source 02/17/24 0826 Oral     SpO2 02/17/24 0826 95 %     Weight --      Height --      Head Circumference --      Peak Flow --      Pain Score 02/17/24 0824 0     Pain Loc --      Pain Education --      Exclude from Growth Chart --    No data found.  Updated Vital Signs BP (!) 156/82 (BP Location: Right Arm)   Pulse 85   Temp 98.1 F (36.7 C) (Oral)   Resp (!) 24   SpO2 95%   Visual Acuity Right Eye Distance:   Left Eye Distance:   Bilateral Distance:    Right Eye Near:   Left Eye  Near:    Bilateral Near:     Physical Exam Vitals reviewed.  Constitutional:      General: She is not in acute distress.    Appearance: Normal appearance. She is not ill-appearing or diaphoretic.  HENT:     Head: Normocephalic and atraumatic.  Cardiovascular:     Rate and Rhythm: Normal rate and regular rhythm.     Heart sounds: Normal heart sounds.  Pulmonary:     Effort: Pulmonary effort is normal. Tachypnea present.     Breath sounds: Decreased breath sounds present.     Comments: Initially with decreased breath sounds throughout.  Following DuoNeb treatment, slight improvement. Skin:    General: Skin is warm.  Neurological:     General: No focal deficit present.     Mental Status: She is alert and oriented to person, place, and time.  Psychiatric:        Mood and Affect: Mood normal.        Behavior: Behavior normal.        Thought Content: Thought content normal.        Judgment: Judgment normal.      UC Treatments / Results  Labs (all labs ordered are listed, but only abnormal results are displayed) Labs Reviewed - No data to display  EKG   Radiology DG Chest 2 View Result Date: 02/17/2024 CLINICAL DATA:  Shortness of breath EXAM: DG CHEST 2V COMPARISON:  January 28, 2020 FINDINGS: The heart size and mediastinal contours are within normal limits. Both lungs are clear. The visualized skeletal structures are unremarkable. IMPRESSION: No active cardiopulmonary disease. Electronically Signed   By: Lynwood Landy Raddle M.D.   On: 02/17/2024 09:27    Procedures Procedures (including critical care time)  Medications Ordered in UC  Medications  ipratropium-albuterol  (DUONEB) 0.5-2.5 (3) MG/3ML nebulizer solution 3 mL (3 mLs Nebulization Given 02/17/24 0836)    Initial Impression / Assessment and Plan / UC Course  I have reviewed the triage vital signs and the nursing notes.  Pertinent labs & imaging results that were available during my care of the patient were  reviewed by me and considered in my medical decision making (see chart for details).     Patient is a pleasant 78 y.o. female presenting with COPD exacerbation. The patient is afebrile and nontachycardic.  Antipyretic has not been administered today.  Initially with decreased breath sounds throughout.  Following DuoNeb treatment, slight improvement.  CXR: No active cardiopulmonary disease.   Will manage as COPD exacerbation with prednisone  burst and Z-Pak antibiotic.  (She completed Augmentin  about 3 weeks ago).  She verbalizes that she has enough of her inhalers at home, and she declines prescription for nebulizer solution.  I also sent a prescription for a pulse ox machine, and advised her to monitor her oxygen as below.  Strict ER return precautions; discussed that the patient is at risk for respiratory failure, and if this happens, she would require emergent treatment and oxygen.  Of note, the patient is a diabetic, and we discussed that the prednisone  may raise her sugars.  She understands this risk and would like to proceed.  -Coding Level 4 for acute exacerbation of chronic condition and prescription drug management.   Final Clinical Impressions(s) / UC Diagnoses   Final diagnoses:  COPD exacerbation (HCC)  Type 2 diabetes mellitus with hyperglycemia, without long-term current use of insulin  [facility])     Discharge Instructions      -Your chest xray looks okay today, so we are treating you for asthma and COPD (inflammation in lungs) -Z-pack antibiotic x5 days -Prednisone , 2 pills taken at the same time for 7 days in a row.  Try taking this earlier in the day as it can give you energy. Avoid NSAIDs like ibuprofen  and alleve while taking this medication as they can increase your risk of stomach upset and even GI bleeding when in combination with a steroid. You can continue tylenol  (acetaminophen ) up to 1000mg  3x daily. -Continue symbicort  inhaler, and Albuterol  inhaler as needed for  cough, wheezing, shortness of breath, 1 to 2 puffs every 6 hours as needed. -I also sent a prescription for a pulse ox monitor. Use this 2-3x daily. Your oxygen should be >94%. If it is <94%, and you are feeling short of breath, and this is not improved by your inhalers, head to the ER or call 911 -Your cough should slowly get better instead of worse. If you develop a cough productive of dark or red sputum, new shortness of breath, new chest tightness, new fevers, etc - seek additional care.     ED Prescriptions     Medication Sig Dispense Auth. Provider   Misc. Devices (PULSE OXIMETER FOR FINGER) MISC 1 each by Does not apply route daily as needed. 1 each Esley Brooking E, PA-C   predniSONE  (DELTASONE ) 20 MG tablet Take 2 tablets (40 mg total) by mouth daily for 7 days. Take with breakfast or lunch. Avoid NSAIDs (ibuprofen , etc) while taking this medication. 14 tablet Jeromiah Ohalloran E, PA-C   azithromycin  (ZITHROMAX ) 250 MG tablet Take 1 tablet (250 mg total) by mouth daily. Take first 2 tablets together, then 1 every day until finished. 6 tablet Alynah Schone E, PA-C      PDMP not reviewed this  encounter.     [1]  Social History Tobacco Use   Smoking status: Every Day    Current packs/day: 0.00    Types: Cigarettes    Last attempt to quit: 11/13/2017    Years since quitting: 6.2   Smokeless tobacco: Never  Vaping Use   Vaping status: Never Used  Substance Use Topics   Alcohol use: Yes    Comment: rare   Drug use: No     Arlyss Leita BRAVO, PA-C 02/17/24 1010  "

## 2024-02-19 ENCOUNTER — Other Ambulatory Visit: Payer: Self-pay | Admitting: Family Medicine

## 2024-02-19 MED ORDER — FLUTICASONE PROPIONATE 50 MCG/ACT NA SUSP: 2.0000 | Freq: Every day | NASAL | 6 refills | Status: AC

## 2024-02-19 MED ORDER — CEFDINIR 300 MG PO CAPS: 300.0000 mg | ORAL_CAPSULE | Freq: Two times a day (BID) | ORAL | 0 refills | Status: AC

## 2024-02-25 ENCOUNTER — Ambulatory Visit: Admitting: Family Medicine

## 2024-02-25 ENCOUNTER — Encounter: Payer: Self-pay | Admitting: Family Medicine

## 2024-02-25 VITALS — BP 120/64 | HR 75 | Temp 97.7°F | Ht 65.0 in | Wt 159.4 lb

## 2024-02-25 DIAGNOSIS — J441 Chronic obstructive pulmonary disease with (acute) exacerbation: Secondary | ICD-10-CM | POA: Diagnosis not present

## 2024-02-25 DIAGNOSIS — R0600 Dyspnea, unspecified: Secondary | ICD-10-CM

## 2024-02-25 MED ORDER — ALBUTEROL SULFATE HFA 108 (90 BASE) MCG/ACT IN AERS: 2.0000 | INHALATION_SPRAY | Freq: Four times a day (QID) | RESPIRATORY_TRACT | 0 refills | Status: AC | PRN

## 2024-02-25 MED ORDER — PREDNISONE 20 MG PO TABS: 60.0000 mg | ORAL_TABLET | Freq: Every day | ORAL | 0 refills | Status: AC

## 2024-02-25 MED ORDER — METHYLPREDNISOLONE ACETATE 80 MG/ML IJ SUSP
80.0000 mg | Freq: Once | INTRAMUSCULAR | Status: AC
  Administered 2024-02-25: 80 mg via INTRAMUSCULAR

## 2024-02-25 NOTE — Progress Notes (Signed)
 "  Subjective:    Patient ID: Kathleen Ramirez, female    DOB: 1945-07-12, 79 y.o.   MRN: [id number]  I treated the patient earlier this month for a sinus infection.  She went to the emergency room New Year's Eve with shortness of breath and was treated for a COPD exacerbation with a Z-Pak and prednisone .  Chest x-ray was obtained 1231 and was unremarkable.  I see no additional laboratory studies.  She presents today acutely short of breath.  She has increased work of breathing.  She has diminished breath sounds in all 4 lung fields.  She has very little air movement.  There are faint expiratory wheezes but no crackles or rails appreciated.  She denies any chest pain but she states that she feels like she can barely catch her breath.  Patient states that she has been using her Symbicort  twice a day but is not using her rescue inhaler. Past Medical History:  Diagnosis Date   Aortic stenosis 09/02/2009   Mild   Asthma    Bilateral ovarian tumors    Breast fibroadenoma    Cataract    Cervical cancer (HCC)    COPD (chronic obstructive pulmonary disease) (HCC)    DDD (degenerative disc disease), cervical    DDD (degenerative disc disease), lumbar    Diabetes mellitus without complication (HCC)    Diverticulosis, sigmoid 12/05/2015   Noted on Colonoscopy   Dysplastic colon polyp    Forearm fracture 10/18/2011   Right   Frequent urination    Ganglion cyst of wrist, right    pt unaware   History of colon polyps    HLD (hyperlipidemia)    Internal hemorrhoids 12/05/2015   Noted on Colonoscopy   Macular degeneration    Polycythemia    PONV (postoperative nausea and vomiting)    Smoking    Tumors    5tumors removed   UTI (lower urinary tract infection)    Yeast vaginitis    Past Surgical History:  Procedure Laterality Date   ABDOMINAL HYSTERECTOMY     APPENDECTOMY     BLADDER NECK SUSPENSION     BLADDER SURGERY     BREAST LUMPECTOMY     CATARACT EXTRACTION, BILATERAL     COLONOSCOPY  W/ POLYPECTOMY  12/05/2015   oophrectomy     ORIF ULNAR FRACTURE  10/19/2011   Procedure: OPEN REDUCTION INTERNAL FIXATION (ORIF) ULNAR FRACTURE;  Surgeon: Prentice LELON Pagan, MD;  Location: [facility] OR;  Service: Orthopedics;  Laterality: Right;   TONSILLECTOMY     TOTAL HIP ARTHROPLASTY Right 12/22/2017   Procedure: RIGHT TOTAL HIP ARTHROPLASTY ANTERIOR APPROACH;  Surgeon: Ernie Cough, MD;  Location: [facility];  Service: Orthopedics;  Laterality: Right;  70   TOTAL HIP ARTHROPLASTY Left 03/23/2018   Procedure: TOTAL HIP ARTHROPLASTY ANTERIOR APPROACH;  Surgeon: Ernie Cough, MD;  Location: [facility];  Service: Orthopedics;  Laterality: Left;  70 minutes   TUBAL LIGATION     TUMOR EXCISION     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 20 capsule 0   fluticasone  (FLONASE ) 50 MCG/ACT nasal spray Place 2 sprays into both nostrils daily. 16 g 6   glipiZIDE  (GLUCOTROL  XL) 5 MG 24 hr tablet TAKE 1 TABLET(5 MG) BY MOUTH DAILY WITH BREAKFAST 90 tablet 0   Glucose Blood (BLOOD GLUCOSE TEST STRIPS) STRP Use to check blood sugar, fasting, once per day.  May substitute to any manufacturer covered by patient's insurance. Patient has a Contour Next Gen Monitor at home. 100 strip 3   Lancets 30G MISC Use to check blood sugar fasting daily. 100 each 3   metFORMIN  (GLUCOPHAGE ) 500 MG tablet TAKE 1 TABLET(500 MG) BY MOUTH TWICE DAILY WITH A MEAL 180 tablet 1   Misc. Devices (PULSE OXIMETER FOR  FINGER) MISC 1 each by Does not apply route daily as needed. 1 each 0   ondansetron  (ZOFRAN ) 4 MG tablet Take 1 tablet (4 mg total) by mouth every 8 (eight) hours as needed for nausea or vomiting. 4 tablet 0   pioglitazone  (ACTOS ) 30 MG tablet TAKE 1 TABLET(30 MG) BY MOUTH DAILY 90 tablet 1   sitaGLIPtin  (JANUVIA ) 100 MG tablet TAKE 1 TABLET(100 MG) BY MOUTH DAILY 90 tablet 2   No current facility-administered medications on file prior to visit.   Allergies  Allergen Reactions   Codeine Nausea And Vomiting     makes heart race   Social History   Socioeconomic History   Marital status: Single    Spouse name: Not on file   Number of children: Not on file   Years of education: Not on file   Highest education level: Not on file  Occupational History   Not on file  Tobacco Use   Smoking status: Every Day    Current packs/day: 0.00    Types: Cigarettes    Last attempt to quit: 11/13/2017    Years since quitting: 6.2   Smokeless tobacco: Never  Vaping Use   Vaping status: Never Used  Substance and Sexual Activity   Alcohol use: Yes    Comment: rare   Drug use: No   Sexual activity: Not on file    Comment: single female partner  Other Topics Concern   Not on file  Social History Narrative   Not on file   Social Drivers of Health   Tobacco Use: High Risk (02/17/2024)   Patient History    Smoking Tobacco Use: Every Day    Smokeless Tobacco Use: Never    Passive Exposure: Not on file  Financial Resource Strain: Not on file  Food Insecurity: Not on file  Transportation Needs: Not on file  Physical Activity: Not on file  Stress: Not on file  Social Connections: Not on file  Intimate Partner Violence: Not on file  Depression (EYV7-0): Not on file  Alcohol Screen: Not on file  Housing: Not on file  Utilities: Not on file  Health Literacy: Not on file     Review of Systems  All other systems reviewed and are negative.      Objective:   Physical Exam Vitals  reviewed.  Constitutional:      Appearance: She is well-developed.  HENT:     Right Ear: Tympanic membrane and ear canal normal.     Left Ear: Tympanic membrane and ear canal normal.     Nose: No congestion or rhinorrhea.     Right Sinus: Frontal  sinus tenderness present.     Left Sinus: Frontal sinus tenderness present.  Neck:     Vascular: No carotid bruit.  Cardiovascular:     Rate and Rhythm: Normal rate. Rhythm irregular.     Heart sounds: Normal heart sounds.  Pulmonary:     Effort: Tachypnea present. No respiratory distress.     Breath sounds: Decreased air movement present. Examination of the right-upper field reveals decreased breath sounds. Examination of the left-upper field reveals decreased breath sounds. Examination of the right-middle field reveals decreased breath sounds. Examination of the left-middle field reveals decreased breath sounds. Examination of the right-lower field reveals decreased breath sounds. Examination of the left-lower field reveals decreased breath sounds. Decreased breath sounds and wheezing present. No rhonchi or rales.  Abdominal:     General: Abdomen is flat. Bowel sounds are normal.     Palpations: Abdomen is soft.  Musculoskeletal:     Cervical back: Neck supple. No tenderness.     Right lower leg: No edema.     Left lower leg: No edema.  Lymphadenopathy:     Cervical: No cervical adenopathy.  Skin:    Findings: No rash.  Neurological:     General: No focal deficit present.     Mental Status: She is oriented to person, place, and time.     Cranial Nerves: No cranial nerve deficit.     Sensory: No sensory deficit.     Motor: No weakness.     Gait: Gait normal.     Deep Tendon Reflexes: Reflexes normal.      EKG shows sinus rhythm.  There are definite P waves however the patient is also having ectopic atrial complexes that makes her rate irregular.  There is no evidence of any ischemia or infarction on her EKG.  Intervals and axis are  normal.           Assessment & Plan:  Dyspnea, unspecified type - Plan: EKG 12-Lead, methylPREDNISolone  acetate (DEPO-MEDROL ) injection 80 mg  COPD with acute exacerbation (HCC) - Plan: methylPREDNISolone  acetate (DEPO-MEDROL ) injection 80 mg Patient received DuoNeb as well as 80 mg of Depo-Medrol .  EKG is reassuring.  I believe the patient is having a COPD exacerbation.  Her breathing improved after 1 DuoNeb.  Recommended she start prednisone  60 mg a day for 5 days.  I want to see her back on Monday.  Use albuterol  2 puffs every 4-6 hours as needed which she has not been doing.  Recheck again on Monday or seek medical attention immediately if worsening.  If worsening she may need admission with more frequent nebulizer treatments under telemetry monitoring.  Patient denies chest pain "

## 2024-02-29 ENCOUNTER — Ambulatory Visit (INDEPENDENT_AMBULATORY_CARE_PROVIDER_SITE_OTHER): Admitting: Family Medicine

## 2024-02-29 VITALS — BP 134/78 | HR 75 | Temp 97.7°F | Ht 65.0 in | Wt 161.0 lb

## 2024-02-29 DIAGNOSIS — R5382 Chronic fatigue, unspecified: Secondary | ICD-10-CM

## 2024-02-29 DIAGNOSIS — R0600 Dyspnea, unspecified: Secondary | ICD-10-CM

## 2024-02-29 MED ORDER — PREDNISONE 20 MG PO TABS: 20.0000 mg | ORAL_TABLET | Freq: Every day | ORAL | 0 refills | Status: AC

## 2024-02-29 NOTE — Progress Notes (Signed)
 "  Subjective:    Patient ID: Kathleen Ramirez, female    DOB: 25-Nov-1945, 79 y.o.   MRN: [id number] 02/25/24 I treated the patient earlier this month for a sinus infection.  She went to the emergency room New Year's Eve with shortness of breath and was treated for a COPD exacerbation with a Z-Pak and prednisone .  Chest x-ray was obtained 1231 and was unremarkable.  I see no additional laboratory studies.  She presents today acutely short of breath.  She has increased work of breathing.  She has diminished breath sounds in all 4 lung fields.  She has very little air movement.  There are faint expiratory wheezes but no crackles or rails appreciated.  She denies any chest pain but she states that she feels like she can barely catch her breath.  Patient states that she has been using her Symbicort  twice a day but is not using her rescue inhaler.  At that time, my plan was:  Patient received DuoNeb as well as 80 mg of Depo-Medrol .  EKG is reassuring.  I believe the patient is having a COPD exacerbation.  Her breathing improved after 1 DuoNeb.  Recommended she start prednisone  60 mg a day for 5 days.  I want to see her back on Monday.  Use albuterol  2 puffs every 4-6 hours as needed which she has not been doing.  Recheck again on Monday or seek medical attention immediately if worsening.  If worsening she may need admission with more frequent nebulizer treatments under telemetry monitoring.  Patient denies chest pain  02/29/24 Patient states her breathing is doing some better.  She is using albuterol  every 6 hours but her work of breathing has diminished.  Her wheezing has improved.  She does not feel quite as short of breath.  She is still on 60 mg a day of prednisone .  However she reports feeling short of breath and extremely tired.  She states will she ever get better.  She denies any chest pain. Past Medical History:  Diagnosis Date   Aortic stenosis 09/02/2009   Mild   Asthma    Bilateral ovarian tumors     Breast fibroadenoma    Cataract    Cervical cancer (HCC)    COPD (chronic obstructive pulmonary disease) (HCC)    DDD (degenerative disc disease), cervical    DDD (degenerative disc disease), lumbar    Diabetes mellitus without complication (HCC)    Diverticulosis, sigmoid 12/05/2015   Noted on Colonoscopy   Dysplastic colon polyp    Forearm fracture 10/18/2011   Right   Frequent urination    Ganglion cyst of wrist, right    pt unaware   History of colon polyps    HLD (hyperlipidemia)    Internal hemorrhoids 12/05/2015   Noted on Colonoscopy   Macular degeneration    Polycythemia    PONV (postoperative nausea and vomiting)    Smoking    Tumors    5tumors removed   UTI (lower urinary tract infection)    Yeast vaginitis    Past Surgical History:  Procedure Laterality Date   ABDOMINAL HYSTERECTOMY     APPENDECTOMY     BLADDER NECK SUSPENSION     BLADDER SURGERY     BREAST LUMPECTOMY     CATARACT EXTRACTION, BILATERAL     COLONOSCOPY W/ POLYPECTOMY  12/05/2015   oophrectomy     ORIF ULNAR FRACTURE  10/19/2011   Procedure: OPEN REDUCTION INTERNAL FIXATION (ORIF) ULNAR FRACTURE;  Surgeon:  Prentice LELON Pagan, MD;  Location: [facility];  Service: Orthopedics;  Laterality: Right;   TONSILLECTOMY     TOTAL HIP ARTHROPLASTY Right 12/22/2017   Procedure: RIGHT TOTAL HIP ARTHROPLASTY ANTERIOR APPROACH;  Surgeon: Ernie Cough, MD;  Location: [facility];  Service: Orthopedics;  Laterality: Right;  70   TOTAL HIP ARTHROPLASTY Left 03/23/2018   Procedure: TOTAL HIP ARTHROPLASTY ANTERIOR APPROACH;  Surgeon: Ernie Cough, MD;  Location: [facility];  Service: Orthopedics;  Laterality: Left;  70 minutes   TUBAL LIGATION     TUMOR EXCISION     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 8 g 0   atorvastatin  (LIPITOR) 20 MG tablet TAKE 1 TABLET(20 MG) BY MOUTH DAILY 90 tablet 0   budesonide -formoterol  (SYMBICORT ) 160-4.5 MCG/ACT inhaler INHALE 2 PUFFS INTO THE LUNGS TWICE DAILY 10.2 g 1   cefdinir  (OMNICEF ) 300 MG capsule Take 1 capsule (300 mg total) by mouth 2 (two) times daily. 20 capsule 0   fluticasone  (FLONASE ) 50 MCG/ACT nasal spray Place 2 sprays into both nostrils daily. 16 g 6   glipiZIDE  (GLUCOTROL  XL) 5 MG 24 hr tablet TAKE 1 TABLET(5 MG) BY MOUTH DAILY WITH BREAKFAST 90 tablet 0   Glucose Blood (BLOOD GLUCOSE TEST STRIPS) STRP Use to check blood sugar, fasting, once per day.  May substitute to any manufacturer covered by patient's insurance. Patient has a Contour Next Gen Monitor at home. 100 strip 3   Lancets 30G MISC Use to check blood sugar fasting daily. 100 each 3   metFORMIN  (GLUCOPHAGE ) 500 MG tablet TAKE 1 TABLET(500 MG) BY MOUTH TWICE DAILY WITH A MEAL 180 tablet 1   Misc. Devices (PULSE OXIMETER FOR FINGER) MISC 1 each by Does not apply route daily as needed. 1 each 0   ondansetron  (ZOFRAN ) 4 MG tablet Take 1 tablet (4 mg total) by mouth every 8 (eight) hours as needed for nausea or vomiting. 4 tablet 0   pioglitazone  (ACTOS ) 30 MG tablet TAKE 1 TABLET(30 MG) BY MOUTH DAILY 90 tablet 1   sitaGLIPtin  (JANUVIA ) 100 MG tablet TAKE 1 TABLET(100 MG) BY MOUTH DAILY 90 tablet 2   predniSONE  (DELTASONE ) 20 MG tablet Take 3 tablets (60 mg total) by mouth daily with breakfast. 15 tablet 0   No current facility-administered medications on file prior to visit.   Allergies  Allergen Reactions   Codeine Nausea And Vomiting     makes heart race   Social History   Socioeconomic History   Marital status: Single    Spouse name: Not on file   Number of children: Not on file   Years of  education: Not on file   Highest education level: Not on file  Occupational History   Not on file  Tobacco Use   Smoking status: Every Day    Current packs/day: 0.00    Types: Cigarettes    Last attempt to quit: 11/13/2017    Years since quitting: 6.2   Smokeless tobacco: Never  Vaping Use   Vaping status: Never Used  Substance and Sexual Activity  Alcohol use: Yes    Comment: rare   Drug use: No   Sexual activity: Not on file    Comment: single female partner  Other Topics Concern   Not on file  Social History Narrative   Not on file   Social Drivers of Health   Tobacco Use: High Risk (02/25/2024)   Patient History    Smoking Tobacco Use: Every Day    Smokeless Tobacco Use: Never    Passive Exposure: Not on file  Financial Resource Strain: Not on file  Food Insecurity: Not on file  Transportation Needs: Not on file  Physical Activity: Not on file  Stress: Not on file  Social Connections: Not on file  Intimate Partner Violence: Not on file  Depression (EYV7-0): Not on file  Alcohol Screen: Not on file  Housing: Not on file  Utilities: Not on file  Health Literacy: Not on file     Review of Systems  All other systems reviewed and are negative.      Objective:   Physical Exam Vitals reviewed.  Constitutional:      Appearance: She is well-developed.  HENT:     Right Ear: Tympanic membrane and ear canal normal.     Left Ear: Tympanic membrane and ear canal normal.     Nose: No congestion or rhinorrhea.     Right Sinus: Frontal sinus tenderness present.     Left Sinus: Frontal sinus tenderness present.  Neck:     Vascular: No carotid bruit.  Cardiovascular:     Rate and Rhythm: Normal rate. Rhythm irregular.     Heart sounds: Normal heart sounds.  Pulmonary:     Effort: Tachypnea present. No respiratory distress.     Breath sounds: Decreased air movement present. Examination of the right-upper field reveals decreased breath sounds. Examination of the  left-upper field reveals decreased breath sounds. Examination of the right-middle field reveals decreased breath sounds. Examination of the left-middle field reveals decreased breath sounds. Examination of the right-lower field reveals decreased breath sounds. Examination of the left-lower field reveals decreased breath sounds. Decreased breath sounds and wheezing present. No rhonchi or rales.  Abdominal:     General: Abdomen is flat. Bowel sounds are normal.     Palpations: Abdomen is soft.  Musculoskeletal:     Cervical back: Neck supple. No tenderness.     Right lower leg: No edema.     Left lower leg: No edema.  Lymphadenopathy:     Cervical: No cervical adenopathy.  Skin:    Findings: No rash.  Neurological:     General: No focal deficit present.     Mental Status: She is oriented to person, place, and time.     Cranial Nerves: No cranial nerve deficit.     Sensory: No sensory deficit.     Motor: No weakness.     Gait: Gait normal.     Deep Tendon Reflexes: Reflexes normal.               Assessment & Plan:  Chronic fatigue - Plan: CBC with Differential/Platelet, Comprehensive metabolic panel with GFR, Vitamin B12, TSH, Brain natriuretic peptide  Dyspnea, unspecified type I believe her dyspnea and fatigue is due to severe COPD exacerbation.  Gradually wean the patient off prednisone .  Once she has finished 5 days of prednisone  60 mg a day I have asked her to decrease to 40 mg a day for 3 days and then 20 mg a day for 3 days and  then stop.  She can start weaning down the albuterol  to every 8 hours as needed.  Meanwhile, due to her chronic dyspnea I am going to get an echocardiogram.  Also because of her chronic fatigue I will check a CBC CMP B12 and a TSH.  If echocardiogram and labs are normal, hopefully her breathing will continue to improve as the COPD exacerbation resolves. "

## 2024-03-01 ENCOUNTER — Ambulatory Visit: Payer: Self-pay | Admitting: Family Medicine

## 2024-03-01 LAB — COMPREHENSIVE METABOLIC PANEL WITH GFR
AG Ratio: 1.4 (calc) (ref 1.0–2.5)
ALT: 16 U/L (ref 6–29)
AST: 14 U/L (ref 10–35)
Albumin: 4.1 g/dL (ref 3.6–5.1)
Alkaline phosphatase (APISO): 56 U/L (ref 37–153)
BUN: 19 mg/dL (ref 7–25)
CO2: 30 mmol/L (ref 20–32)
Calcium: 9.5 mg/dL (ref 8.6–10.4)
Chloride: 94 mmol/L — ABNORMAL LOW (ref 98–110)
Creat: 0.84 mg/dL (ref 0.60–1.00)
Globulin: 2.9 g/dL (ref 1.9–3.7)
Glucose, Bld: 148 mg/dL — ABNORMAL HIGH (ref 65–99)
Potassium: 4.7 mmol/L (ref 3.5–5.3)
Sodium: 136 mmol/L (ref 135–146)
Total Bilirubin: 0.6 mg/dL (ref 0.2–1.2)
Total Protein: 7 g/dL (ref 6.1–8.1)
eGFR: 72 mL/min/1.73m2

## 2024-03-01 LAB — BRAIN NATRIURETIC PEPTIDE: Brain Natriuretic Peptide: 17 pg/mL

## 2024-03-01 LAB — CBC WITH DIFFERENTIAL/PLATELET
Absolute Lymphocytes: 2654 {cells}/uL (ref 850–3900)
Absolute Monocytes: 986 {cells}/uL — ABNORMAL HIGH (ref 200–950)
Basophils Absolute: 90 {cells}/uL (ref 0–200)
Basophils Relative: 0.8 %
Eosinophils Absolute: 235 {cells}/uL (ref 15–500)
Eosinophils Relative: 2.1 %
HCT: 47.8 % — ABNORMAL HIGH (ref 35.9–46.0)
Hemoglobin: 15.5 g/dL (ref 11.7–15.5)
MCH: 28.5 pg (ref 27.0–33.0)
MCHC: 32.4 g/dL (ref 31.6–35.4)
MCV: 88 fL (ref 81.4–101.7)
MPV: 11.6 fL (ref 7.5–12.5)
Monocytes Relative: 8.8 %
Neutro Abs: 7235 {cells}/uL (ref 1500–7800)
Neutrophils Relative %: 64.6 %
Platelets: 268 Thousand/uL (ref 140–400)
RBC: 5.43 Million/uL — ABNORMAL HIGH (ref 3.80–5.10)
RDW: 12.8 % (ref 11.0–15.0)
Total Lymphocyte: 23.7 %
WBC: 11.2 Thousand/uL — ABNORMAL HIGH (ref 3.8–10.8)

## 2024-03-01 LAB — VITAMIN B12: Vitamin B-12: 304 pg/mL (ref 200–1100)

## 2024-03-01 LAB — TSH: TSH: 2.65 m[IU]/L (ref 0.40–4.50)

## 2024-03-01 MED ORDER — IPRATROPIUM-ALBUTEROL 0.5-2.5 (3) MG/3ML IN SOLN
3.0000 mL | Freq: Once | RESPIRATORY_TRACT | Status: AC
  Administered 2024-02-25: 3 mL via RESPIRATORY_TRACT

## 2024-03-01 NOTE — Addendum Note (Signed)
 Addended by: ANGELENA RONAL BRADLEY K on: 03/01/2024 10:23 AM   Modules accepted: Orders

## 2024-03-08 ENCOUNTER — Ambulatory Visit: Admitting: Family Medicine

## 2024-03-08 ENCOUNTER — Encounter: Payer: Self-pay | Admitting: Family Medicine

## 2024-03-08 VITALS — BP 128/72 | HR 64 | Temp 97.8°F | Ht 65.0 in | Wt 164.8 lb

## 2024-03-08 DIAGNOSIS — J449 Chronic obstructive pulmonary disease, unspecified: Secondary | ICD-10-CM | POA: Diagnosis not present

## 2024-03-08 DIAGNOSIS — J441 Chronic obstructive pulmonary disease with (acute) exacerbation: Secondary | ICD-10-CM

## 2024-03-08 MED ORDER — BREZTRI AEROSPHERE 160-9-4.8 MCG/ACT IN AERO: 2.0000 | INHALATION_SPRAY | Freq: Two times a day (BID) | RESPIRATORY_TRACT | 11 refills | Status: AC

## 2024-03-08 MED ORDER — HYDROCODONE BIT-HOMATROP MBR 5-1.5 MG/5ML PO SOLN: 5.0000 mL | Freq: Three times a day (TID) | ORAL | 0 refills | Status: AC | PRN

## 2024-03-08 NOTE — Progress Notes (Signed)
 "  Subjective:    Patient ID: Kathleen Ramirez, female    DOB: 01/17/1946, 79 y.o.   MRN: [id number] 02/25/24 I treated the patient earlier this month for a sinus infection.  She went to the emergency room New Year's Eve with shortness of breath and was treated for a COPD exacerbation with a Z-Pak and prednisone .  Chest x-ray was obtained 1231 and was unremarkable.  I see no additional laboratory studies.  She presents today acutely short of breath.  She has increased work of breathing.  She has diminished breath sounds in all 4 lung fields.  She has very little air movement.  There are faint expiratory wheezes but no crackles or rails appreciated.  She denies any chest pain but she states that she feels like she can barely catch her breath.  Patient states that she has been using her Symbicort  twice a day but is not using her rescue inhaler.  At that time, my plan was:  Patient received DuoNeb as well as 80 mg of Depo-Medrol .  EKG is reassuring.  I believe the patient is having a COPD exacerbation.  Her breathing improved after 1 DuoNeb.  Recommended she start prednisone  60 mg a day for 5 days.  I want to see her back on Monday.  Use albuterol  2 puffs every 4-6 hours as needed which she has not been doing.  Recheck again on Monday or seek medical attention immediately if worsening.  If worsening she may need admission with more frequent nebulizer treatments under telemetry monitoring.  Patient denies chest pain  02/29/24 Patient states her breathing is doing some better.  She is using albuterol  every 6 hours but her work of breathing has diminished.  Her wheezing has improved.  She does not feel quite as short of breath.  She is still on 60 mg a day of prednisone .  However she reports feeling short of breath and extremely tired.  She states will she ever get better.  She denies any chest pain.  At that time, my plan was: I believe her dyspnea and fatigue is due to severe COPD exacerbation.  Gradually wean the  patient off prednisone .  Once she has finished 5 days of prednisone  60 mg a day I have asked her to decrease to 40 mg a day for 3 days and then 20 mg a day for 3 days and then stop.  She can start weaning down the albuterol  to every 8 hours as needed.  Meanwhile, due to her chronic dyspnea I am going to get an echocardiogram.  Also because of her chronic fatigue I will check a CBC CMP B12 and a TSH.  If echocardiogram and labs are normal, hopefully her breathing will continue to improve as the COPD exacerbation resolves.  03/08/24 Patient states that her breathing is much better.  She still reports feeling extremely tired.  She has a nuisance cough that is keeping her awake at night.  During the day the cough is not bad.  She denies any fevers or chills.  She denies any hemoptysis or purulent sputum Past Medical History:  Diagnosis Date   Aortic stenosis 09/02/2009   Mild   Asthma    Bilateral ovarian tumors    Breast fibroadenoma    Cataract    Cervical cancer (HCC)    COPD (chronic obstructive pulmonary disease) (HCC)    DDD (degenerative disc disease), cervical    DDD (degenerative disc disease), lumbar    Diabetes mellitus without complication (HCC)  Diverticulosis, sigmoid 12/05/2015   Noted on Colonoscopy   Dysplastic colon polyp    Forearm fracture 10/18/2011   Right   Frequent urination    Ganglion cyst of wrist, right    pt unaware   History of colon polyps    HLD (hyperlipidemia)    Internal hemorrhoids 12/05/2015   Noted on Colonoscopy   Macular degeneration    Polycythemia    PONV (postoperative nausea and vomiting)    Smoking    Tumors    5tumors removed   UTI (lower urinary tract infection)    Yeast vaginitis    Past Surgical History:  Procedure Laterality Date   ABDOMINAL HYSTERECTOMY     APPENDECTOMY     BLADDER NECK SUSPENSION     BLADDER SURGERY     BREAST LUMPECTOMY     CATARACT EXTRACTION, BILATERAL     COLONOSCOPY W/ POLYPECTOMY  12/05/2015    oophrectomy     ORIF ULNAR FRACTURE  10/19/2011   Procedure: OPEN REDUCTION INTERNAL FIXATION (ORIF) ULNAR FRACTURE;  Surgeon: Prentice LELON Pagan, MD;  Location: [facility] OR;  Service: Orthopedics;  Laterality: Right;   TONSILLECTOMY     TOTAL HIP ARTHROPLASTY Right 12/22/2017   Procedure: RIGHT TOTAL HIP ARTHROPLASTY ANTERIOR APPROACH;  Surgeon: Ernie Cough, MD;  Location: [facility];  Service: Orthopedics;  Laterality: Right;  70   TOTAL HIP ARTHROPLASTY Left 03/23/2018   Procedure: TOTAL HIP ARTHROPLASTY ANTERIOR APPROACH;  Surgeon: Ernie Cough, MD;  Location: [facility];  Service: Orthopedics;  Laterality: Left;  70 minutes   TUBAL LIGATION     TUMOR EXCISION     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 8 g 0   atorvastatin  (LIPITOR) 20 MG tablet TAKE 1 TABLET(20 MG) BY MOUTH DAILY 90 tablet 0   cefdinir  (OMNICEF ) 300 MG capsule Take 1 capsule (300 mg total) by mouth 2 (two) times daily. 20 capsule 0   fluticasone  (FLONASE ) 50 MCG/ACT nasal spray Place 2 sprays into both nostrils daily. 16 g 6   glipiZIDE  (GLUCOTROL  XL) 5 MG 24 hr tablet TAKE 1 TABLET(5 MG) BY MOUTH DAILY WITH BREAKFAST 90 tablet 0   Glucose Blood (BLOOD GLUCOSE TEST STRIPS) STRP Use to check blood sugar, fasting, once per day.  May substitute to any manufacturer covered by patient's insurance. Patient has a Contour Next Gen Monitor at home. 100 strip 3   Lancets 30G MISC Use to check blood sugar fasting daily. 100 each 3   metFORMIN  (GLUCOPHAGE ) 500 MG tablet TAKE 1 TABLET(500 MG) BY MOUTH TWICE DAILY WITH A MEAL 180 tablet 1   Misc. Devices  (PULSE OXIMETER FOR FINGER) MISC 1 each by Does not apply route daily as needed. 1 each 0   ondansetron  (ZOFRAN ) 4 MG tablet Take 1 tablet (4 mg total) by mouth every 8 (eight) hours as needed for nausea or vomiting. 4 tablet 0   pioglitazone  (ACTOS ) 30 MG tablet TAKE 1 TABLET(30 MG) BY MOUTH DAILY 90 tablet 1   predniSONE  (DELTASONE ) 20 MG tablet Take 3 tablets (60 mg total) by mouth  daily with breakfast. 15 tablet 0   predniSONE  (DELTASONE ) 20 MG tablet Take 1 tablet (20 mg total) by mouth daily with breakfast. 30 tablet 0   sitaGLIPtin  (JANUVIA ) 100 MG tablet TAKE 1 TABLET(100 MG) BY MOUTH DAILY 90 tablet 2   No current facility-administered medications on file prior to visit.   Allergies  Allergen Reactions   Codeine Nausea And Vomiting     makes heart race   Social History   Socioeconomic History   Marital status: Single    Spouse name: Not on file   Number of children: Not on file   Years of education: Not on file   Highest education level: Not on file  Occupational History   Not on file  Tobacco Use   Smoking status: Every Day    Current packs/day: 0.00    Types: Cigarettes    Last attempt to quit: 11/13/2017    Years since quitting: 6.3   Smokeless tobacco: Never  Vaping Use   Vaping status: Never Used  Substance and Sexual Activity   Alcohol use: Yes    Comment: rare   Drug use: No   Sexual activity: Not on file    Comment: single female partner  Other Topics Concern   Not on file  Social History Narrative   Not on file   Social Drivers of Health   Tobacco Use: High Risk (03/08/2024)   Patient History    Smoking Tobacco Use: Every Day    Smokeless Tobacco Use: Never    Passive Exposure: Not on file  Financial Resource Strain: Not on file  Food Insecurity: Not on file  Transportation Needs: Not on file  Physical Activity: Not on file  Stress: Not on file  Social Connections: Not on file  Intimate Partner Violence: Not on file  Depression (PHQ2-9): Low  Risk (03/08/2024)   Depression (PHQ2-9)    PHQ-2 Score: 0  Alcohol Screen: Not on file  Housing: Not on file  Utilities: Not on file  Health Literacy: Not on file     Review of Systems  All other systems reviewed and are negative.      Objective:   Physical Exam Vitals reviewed.  Constitutional:      Appearance: She is well-developed.  HENT:     Right Ear: Tympanic membrane and ear canal normal.     Left Ear: Tympanic membrane and ear canal normal.     Nose: No congestion or rhinorrhea.     Right Sinus: Frontal sinus tenderness present.     Left Sinus: Frontal sinus tenderness present.  Neck:     Vascular: No carotid bruit.  Cardiovascular:     Rate and Rhythm: Normal rate. Rhythm irregular.     Heart sounds: Normal heart sounds.  Pulmonary:     Effort: Pulmonary effort is normal. No tachypnea, accessory muscle usage, prolonged expiration or respiratory distress.     Breath sounds: Decreased air movement present. Examination of the right-upper field reveals decreased breath sounds. Examination of the left-upper field reveals decreased breath sounds. Examination of the right-middle field reveals decreased breath sounds. Examination of the left-middle field reveals decreased breath sounds. Examination of the right-lower field reveals decreased breath sounds. Examination of the left-lower field reveals decreased breath sounds. Decreased breath sounds present. No wheezing, rhonchi or rales.  Abdominal:     General: Abdomen is flat. Bowel sounds are normal.     Palpations: Abdomen is soft.  Musculoskeletal:     Cervical back: Neck  supple. No tenderness.     Right lower leg: No edema.     Left lower leg: No edema.  Lymphadenopathy:     Cervical: No cervical adenopathy.  Skin:    Findings: No rash.  Neurological:     General: No focal deficit present.     Mental Status: She is oriented to person, place, and time.     Cranial Nerves: No cranial nerve deficit.     Sensory: No  sensory deficit.     Motor: No weakness.     Gait: Gait normal.     Deep Tendon Reflexes: Reflexes normal.               Assessment & Plan:  COPD with acute exacerbation (HCC)  Chronic obstructive pulmonary disease, unspecified COPD type (HCC) At this point, the exacerbation has passed.  However I will switch the patient from Symbicort  to Breztri  2 puffs inhaled twice daily to try to better manage her underlying COPD.  She can also use Hycodan 1 teaspoon every 6 hours as needed for cough to get some rest at night "

## 2024-03-16 ENCOUNTER — Ambulatory Visit: Admitting: Family Medicine

## 2024-03-22 ENCOUNTER — Ambulatory Visit: Admitting: Family Medicine

## 2024-04-14 ENCOUNTER — Ambulatory Visit: Admitting: Family Medicine
